# Patient Record
Sex: Male | Born: 1970
Health system: Southern US, Community
[De-identification: ages and names within clinical notes are randomized; demographics above are authoritative.]

## PROBLEM LIST (undated history)

## (undated) DIAGNOSIS — T7840XA Allergy, unspecified, initial encounter: Secondary | ICD-10-CM

## (undated) DIAGNOSIS — K219 Gastro-esophageal reflux disease without esophagitis: Secondary | ICD-10-CM

## (undated) DIAGNOSIS — E119 Type 2 diabetes mellitus without complications: Secondary | ICD-10-CM

## (undated) HISTORY — DX: Allergy, unspecified, initial encounter: T78.40XA

## (undated) HISTORY — PX: WRIST MASS EXCISION: SHX2674

## (undated) HISTORY — DX: Gastro-esophageal reflux disease without esophagitis: K21.9

## (undated) HISTORY — PX: COLONOSCOPY: SHX174

## (undated) HISTORY — PX: TONSILLECTOMY: SUR1361

---

## 2003-12-10 ENCOUNTER — Emergency Department: Payer: Self-pay | Admitting: Emergency Medicine

## 2003-12-10 ENCOUNTER — Other Ambulatory Visit: Payer: Self-pay

## 2006-07-13 ENCOUNTER — Ambulatory Visit: Payer: Self-pay | Admitting: Gastroenterology

## 2006-09-08 ENCOUNTER — Ambulatory Visit: Payer: Self-pay | Admitting: Gastroenterology

## 2006-09-29 ENCOUNTER — Ambulatory Visit: Payer: Self-pay | Admitting: Family Medicine

## 2006-10-04 ENCOUNTER — Ambulatory Visit: Payer: Self-pay | Admitting: Gastroenterology

## 2006-12-15 ENCOUNTER — Ambulatory Visit: Payer: Self-pay | Admitting: Family Medicine

## 2008-05-14 ENCOUNTER — Ambulatory Visit: Payer: Self-pay | Admitting: Internal Medicine

## 2009-01-02 ENCOUNTER — Ambulatory Visit: Payer: Self-pay | Admitting: Internal Medicine

## 2009-01-12 ENCOUNTER — Ambulatory Visit: Payer: Self-pay | Admitting: Urology

## 2009-02-20 ENCOUNTER — Ambulatory Visit: Payer: Self-pay | Admitting: Gastroenterology

## 2009-09-29 ENCOUNTER — Ambulatory Visit: Payer: Self-pay | Admitting: Gastroenterology

## 2011-12-02 IMAGING — CT CT STONE STUDY
1 of 2 series · 15 of 32 positions shown, 19 images · non-contrast
Comparison: none

REASON FOR EXAM: left flank pain
COMMENTS:

[Series 3: soft tissue · axial · 0.78mm/px · z∈[-685,-190]mm · 15 of 181 slices shown, 19 images]
[im 8/181  soft-tissue]
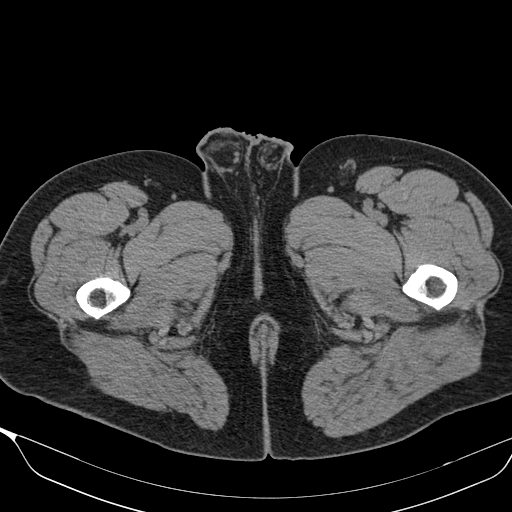
[im 8/181  bone]
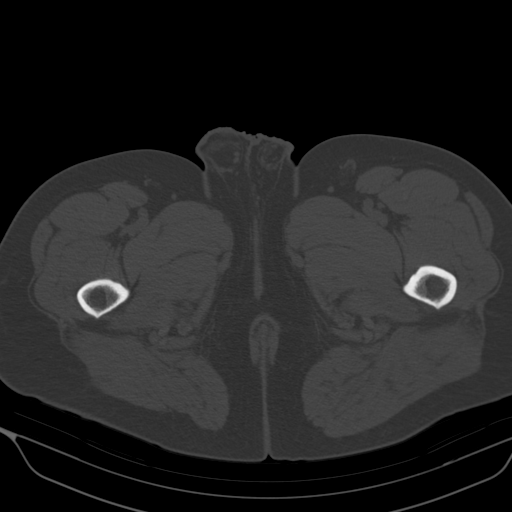
[im 23/181  soft-tissue]
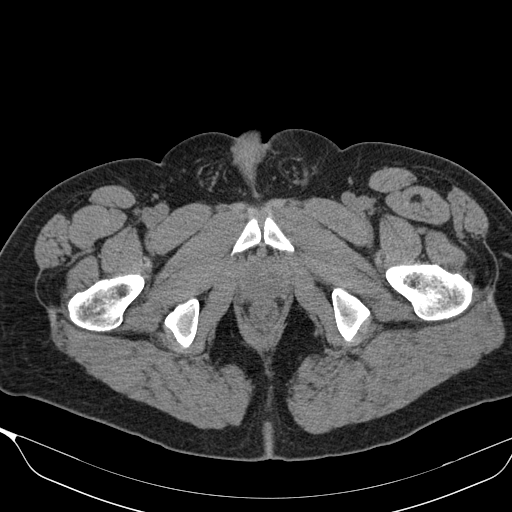
[im 38/181  soft-tissue]
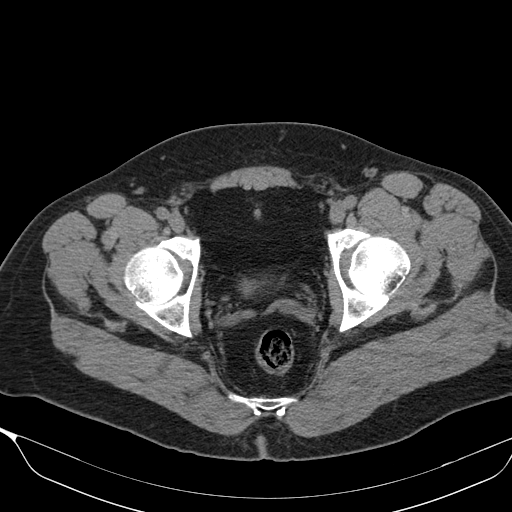
[im 53/181  soft-tissue]
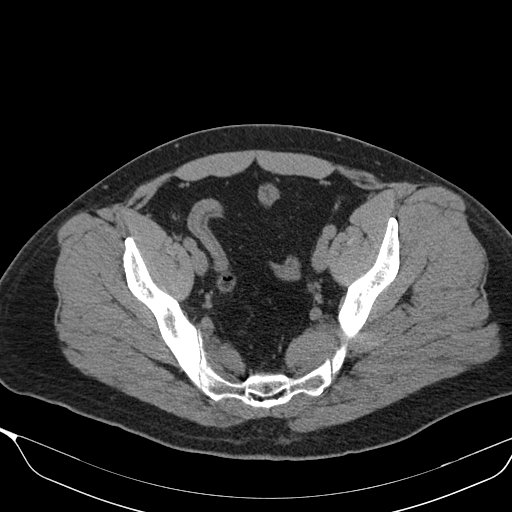
[im 61/181  soft-tissue]
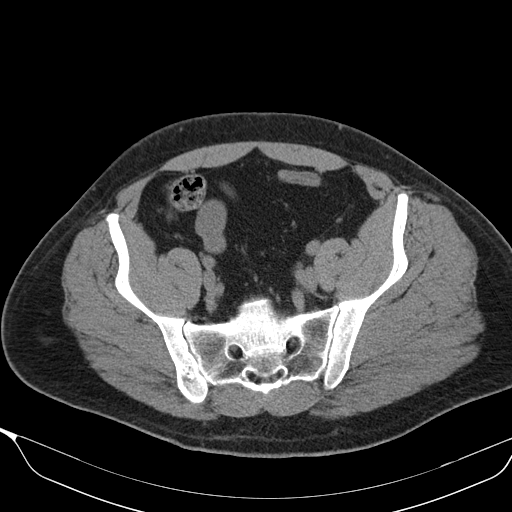
[im 76/181  soft-tissue]
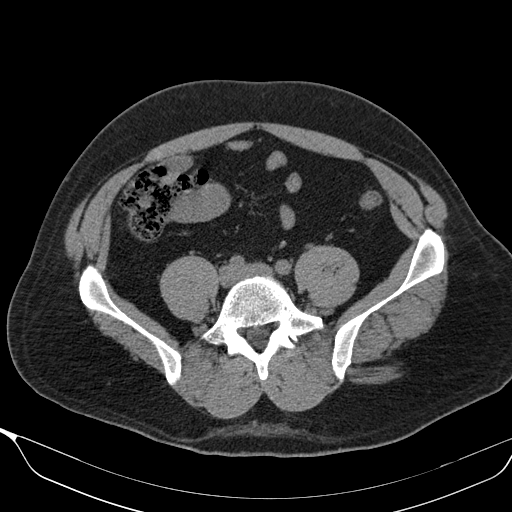
[im 91/181  soft-tissue]
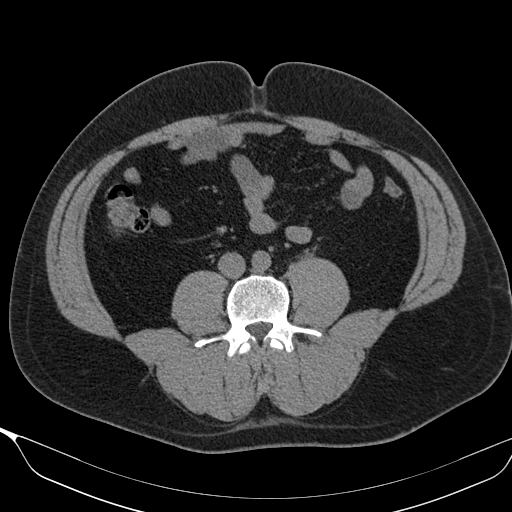
[im 106/181  soft-tissue]
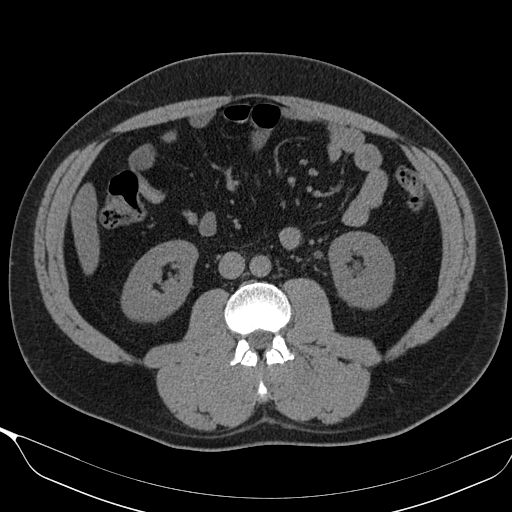
[im 121/181  soft-tissue]
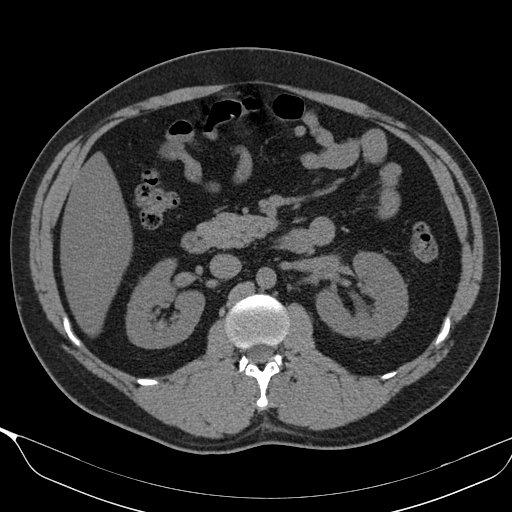
[im 121/181  bone]
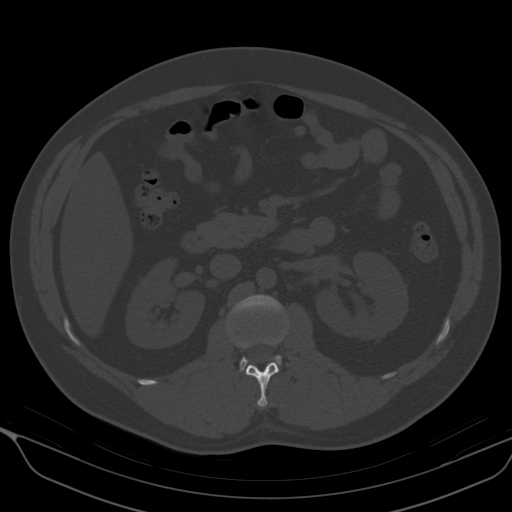
[im 128/181  soft-tissue]
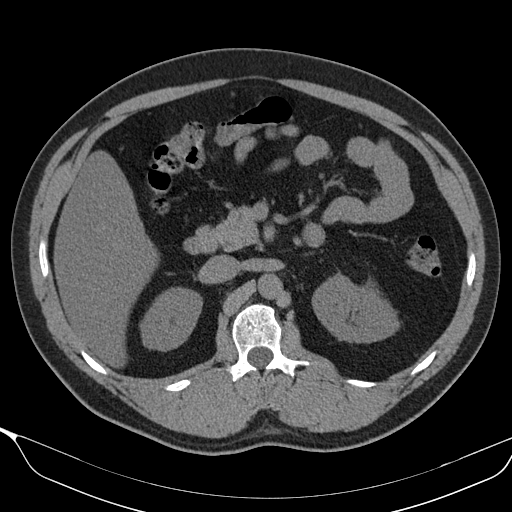
[im 143/181  soft-tissue]
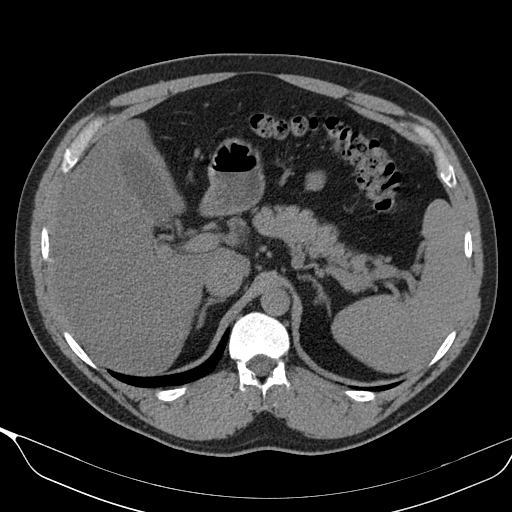
[im 151/181  lung]
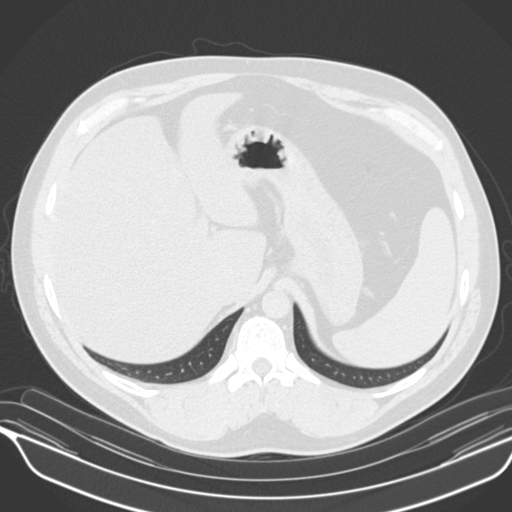
[im 158/181  soft-tissue]
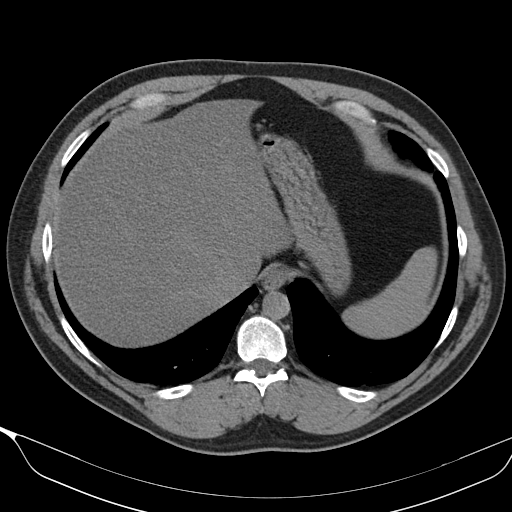
[im 158/181  lung]
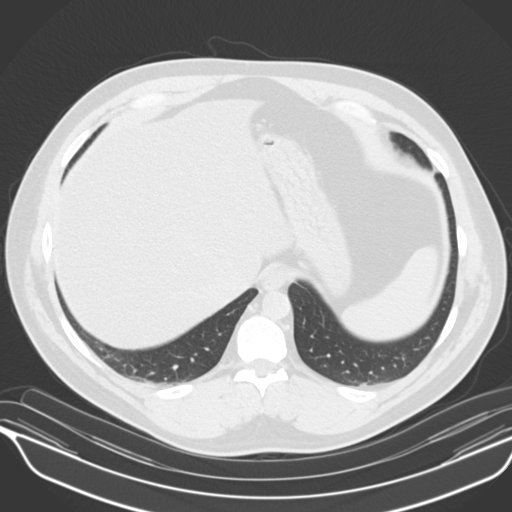
[im 166/181  lung]
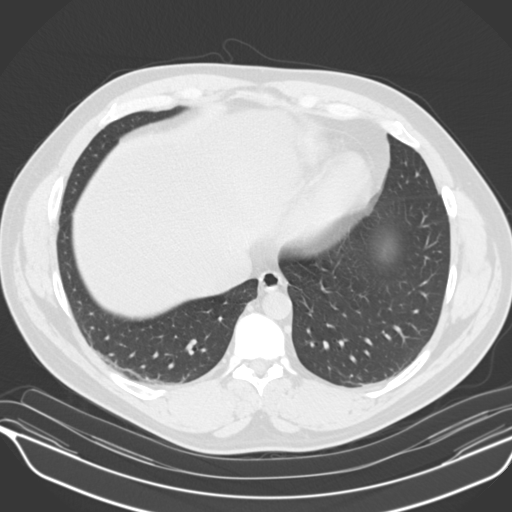
[im 173/181  soft-tissue]
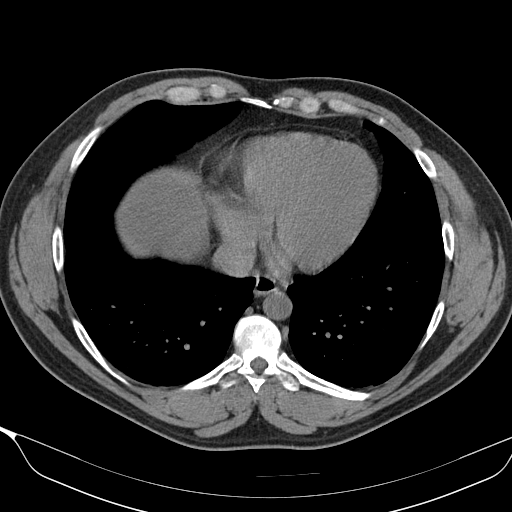
[im 173/181  lung]
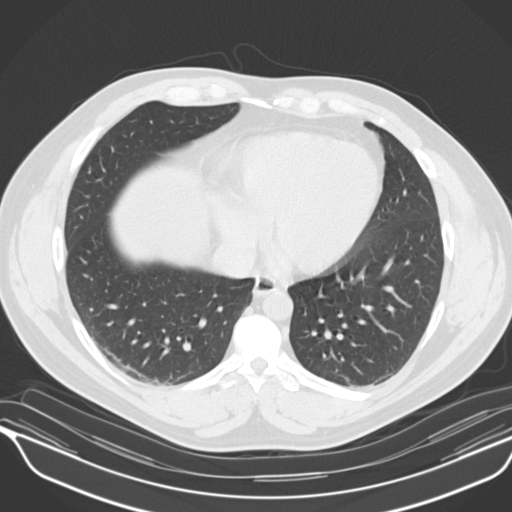

[15 of 32 positions shown; findings below may reference images not displayed]

PROCEDURE:     PAGOL MON - PAGOL MON ABDOMEN/PELVIS WO ( STONE)  - January 02, 2009  [DATE]

RESULT:     Axial noncontrast CT scanning was performed through the abdomen
and pelvis at 3 mm intervals and slice thicknesses.

There is mild hydronephrosis and hydroureter on the left secondary to an
approximately 3 mm diameter stone in the distal left ureter. This is seen on
image 133. There is a tiny lower pole nonobstructing stone on the left.
There are two 1mm or less diameter stones in mid and lower pole calyces on
the right not producing obstruction. The urinary bladder is only partially
distended and is grossly normal.

The liver exhibits decreased density diffusely which likely reflects fatty
infiltration. The gallbladder, spleen, pancreas, stomach, adrenal glands,
and periaortic and pericaval regions are normal in appearance. No acute
bowel abnormality is seen. The lung bases are clear.
IMPRESSION: 1. There is mild hydronephrosis and hydroureter on the left secondary to a 3
mm diameter stone in the distal left ureter. There is a nonobstructing 1 mm
diameter stone in a lower pole calyx on the left.
2. I do not see evidence of obstruction of the right kidney. There is a 1 mm
diameter nonobstructing stone in the right lower pole and another in a right
midpole calyx.
3. I see no acute abnormality elsewhere within the abdomen or pelvis.

This report was discussed by me by telephone with Dr. Peura at [HOSPITAL]
[HOSPITAL][DATE] p.m. on 02 January, 2009.

## 2014-11-10 ENCOUNTER — Emergency Department
Admission: EM | Admit: 2014-11-10 | Discharge: 2014-11-10 | Disposition: A | Discharge status: Home / Self Care | Payer: BLUE CROSS/BLUE SHIELD | Attending: Emergency Medicine | Admitting: Emergency Medicine

## 2014-11-10 ENCOUNTER — Encounter: Payer: Self-pay | Admitting: Emergency Medicine

## 2014-11-10 ENCOUNTER — Emergency Department: Payer: BLUE CROSS/BLUE SHIELD

## 2014-11-10 DIAGNOSIS — Z79899 Other long term (current) drug therapy: Secondary | ICD-10-CM | POA: Insufficient documentation

## 2014-11-10 DIAGNOSIS — R0602 Shortness of breath: Secondary | ICD-10-CM | POA: Insufficient documentation

## 2014-11-10 DIAGNOSIS — E119 Type 2 diabetes mellitus without complications: Secondary | ICD-10-CM | POA: Diagnosis not present

## 2014-11-10 DIAGNOSIS — K2970 Gastritis, unspecified, without bleeding: Secondary | ICD-10-CM

## 2014-11-10 DIAGNOSIS — R1010 Upper abdominal pain, unspecified: Secondary | ICD-10-CM | POA: Diagnosis present

## 2014-11-10 DIAGNOSIS — R109 Unspecified abdominal pain: Secondary | ICD-10-CM

## 2014-11-10 HISTORY — DX: Type 2 diabetes mellitus without complications: E11.9

## 2014-11-10 LAB — URINALYSIS COMPLETE WITH MICROSCOPIC (ARMC ONLY)
Bacteria, UA: NONE SEEN
Bilirubin Urine: NEGATIVE
Glucose, UA: NEGATIVE mg/dL
Hgb urine dipstick: NEGATIVE
Ketones, ur: NEGATIVE mg/dL
Leukocytes, UA: NEGATIVE
Nitrite: NEGATIVE
Protein, ur: NEGATIVE mg/dL
Specific Gravity, Urine: 1.023 (ref 1.005–1.030)
pH: 5 (ref 5.0–8.0)

## 2014-11-10 LAB — COMPREHENSIVE METABOLIC PANEL
ALT: 22 U/L (ref 17–63)
AST: 21 U/L (ref 15–41)
Albumin: 4.7 g/dL (ref 3.5–5.0)
Alkaline Phosphatase: 63 U/L (ref 38–126)
Anion gap: 6 (ref 5–15)
BUN: 14 mg/dL (ref 6–20)
CO2: 26 mmol/L (ref 22–32)
Calcium: 9.2 mg/dL (ref 8.9–10.3)
Chloride: 107 mmol/L (ref 101–111)
Creatinine, Ser: 0.98 mg/dL (ref 0.61–1.24)
GFR calc Af Amer: >60 mL/min (ref >60)
GFR calc non Af Amer: >60 mL/min (ref >60)
Glucose, Bld: 131 mg/dL — ABNORMAL HIGH (ref 65–99)
Potassium: 4 mmol/L (ref 3.5–5.1)
Sodium: 139 mmol/L (ref 135–145)
Total Bilirubin: 1.1 mg/dL (ref 0.3–1.2)
Total Protein: 7.8 g/dL (ref 6.5–8.1)

## 2014-11-10 LAB — CBC
HCT: 39.9 % — ABNORMAL LOW (ref 40.0–52.0)
Hemoglobin: 13.9 g/dL (ref 13.0–18.0)
MCH: 30.6 pg (ref 26.0–34.0)
MCHC: 34.8 g/dL (ref 32.0–36.0)
MCV: 87.9 fL (ref 80.0–100.0)
Platelets: 164 10*3/uL (ref 150–440)
RBC: 4.54 MIL/uL (ref 4.40–5.90)
RDW: 12.6 % (ref 11.5–14.5)
WBC: 10.6 10*3/uL (ref 3.8–10.6)

## 2014-11-10 LAB — GLUCOSE, CAPILLARY: Glucose-Capillary: 126 mg/dL — ABNORMAL HIGH (ref 65–99)

## 2014-11-10 LAB — LIPASE, BLOOD: Lipase: 27 U/L (ref 22–51)

## 2014-11-10 LAB — TROPONIN I: Troponin I: <0.03 ng/mL

## 2014-11-10 MED ORDER — FAMOTIDINE 40 MG PO TABS: 40.0000 mg | ORAL_TABLET | Freq: Every evening | ORAL | Status: DC

## 2014-11-10 MED ORDER — GI COCKTAIL ~~LOC~~
30.0000 mL | Freq: Once | ORAL | Status: AC
  Administered 2014-11-10: 30 mL via ORAL
  Filled 2014-11-10: qty 30

## 2014-11-10 MED ORDER — SUCRALFATE 1 G PO TABS
1.0000 g | ORAL_TABLET | Freq: Once | ORAL | Status: AC
  Administered 2014-11-10: 1 g via ORAL
  Filled 2014-11-10: qty 1

## 2014-11-10 MED ORDER — FAMOTIDINE 40 MG PO TABS
40.0000 mg | ORAL_TABLET | Freq: Every evening | ORAL | Status: DC
Start: 2014-11-10 — End: 2014-11-10

## 2014-11-10 MED ORDER — IOHEXOL 300 MG/ML  SOLN
100.0000 mL | Freq: Once | INTRAMUSCULAR | Status: AC | PRN
  Administered 2014-11-10: 100 mL via INTRAVENOUS

## 2014-11-10 MED ORDER — SUCRALFATE 1 G PO TABS: 1.0000 g | ORAL_TABLET | Freq: Two times a day (BID) | ORAL | Status: DC

## 2014-11-10 MED ORDER — IOHEXOL 240 MG/ML SOLN
25.0000 mL | Freq: Once | INTRAMUSCULAR | Status: AC | PRN
  Administered 2014-11-10: 25 mL via ORAL

## 2014-11-10 MED ORDER — FAMOTIDINE 20 MG PO TABS
40.0000 mg | ORAL_TABLET | Freq: Once | ORAL | Status: AC
  Administered 2014-11-10: 40 mg via ORAL
  Filled 2014-11-10: qty 2

## 2014-11-10 MED ORDER — ONDANSETRON HCL 4 MG/2ML IJ SOLN
4.0000 mg | Freq: Once | INTRAMUSCULAR | Status: AC | PRN
  Administered 2014-11-10: 4 mg via INTRAVENOUS
  Filled 2014-11-10: qty 2

## 2014-11-10 MED ORDER — ONDANSETRON HCL 4 MG/2ML IJ SOLN
4.0000 mg | Freq: Once | INTRAMUSCULAR | Status: AC
  Administered 2014-11-10: 4 mg via INTRAVENOUS
  Filled 2014-11-10: qty 2

## 2014-11-10 MED ORDER — SUCRALFATE 1 G PO TABS: 1.0000 g | ORAL_TABLET | Freq: Four times a day (QID) | ORAL | Status: DC

## 2014-11-10 MED ORDER — MORPHINE SULFATE (PF) 4 MG/ML IV SOLN
4.0000 mg | Freq: Once | INTRAVENOUS | Status: AC
  Administered 2014-11-10: 4 mg via INTRAVENOUS
  Filled 2014-11-10: qty 1

## 2014-11-10 NOTE — Discharge Instructions (Signed)
Gastritis, Adult °Gastritis is soreness and swelling (inflammation) of the lining of the stomach. Gastritis can develop as a sudden onset (acute) or long-term (chronic) condition. If gastritis is not treated, it can lead to stomach bleeding and ulcers. °CAUSES  °Gastritis occurs when the stomach lining is weak or damaged. Digestive juices from the stomach then inflame the weakened stomach lining. The stomach lining may be weak or damaged due to viral or bacterial infections. One common bacterial infection is the Helicobacter pylori infection. Gastritis can also result from excessive alcohol consumption, taking certain medicines, or having too much acid in the stomach.  °SYMPTOMS  °In some cases, there are no symptoms. When symptoms are present, they may include: °· Pain or a burning sensation in the upper abdomen. °· Nausea. °· Vomiting. °· An uncomfortable feeling of fullness after eating. °DIAGNOSIS  °Your caregiver may suspect you have gastritis based on your symptoms and a physical exam. To determine the cause of your gastritis, your caregiver may perform the following: °· Blood or stool tests to check for the H pylori bacterium. °· Gastroscopy. A thin, flexible tube (endoscope) is passed down the esophagus and into the stomach. The endoscope has a light and camera on the end. Your caregiver uses the endoscope to view the inside of the stomach. °· Taking a tissue sample (biopsy) from the stomach to examine under a microscope. °TREATMENT  °Depending on the cause of your gastritis, medicines may be prescribed. If you have a bacterial infection, such as an H pylori infection, antibiotics may be given. If your gastritis is caused by too much acid in the stomach, H2 blockers or antacids may be given. Your caregiver may recommend that you stop taking aspirin, ibuprofen, or other nonsteroidal anti-inflammatory drugs (NSAIDs). °HOME CARE INSTRUCTIONS °· Only take over-the-counter or prescription medicines as directed by  your caregiver. °· If you were given antibiotic medicines, take them as directed. Finish them even if you start to feel better. °· Drink enough fluids to keep your urine clear or pale yellow. °· Avoid foods and drinks that make your symptoms worse, such as: °· Caffeine or alcoholic drinks. °· Chocolate. °· Peppermint or mint flavorings. °· Garlic and onions. °· Spicy foods. °· Citrus fruits, such as oranges, lemons, or limes. °· Tomato-based foods such as sauce, chili, salsa, and pizza. °· Fried and fatty foods. °· Eat small, frequent meals instead of large meals. °SEEK IMMEDIATE MEDICAL CARE IF:  °· You have black or dark red stools. °· You vomit blood or material that looks like coffee grounds. °· You are unable to keep fluids down. °· Your abdominal pain gets worse. °· You have a fever. °· You do not feel better after 1 week. °· You have any other questions or concerns. °MAKE SURE YOU: °· Understand these instructions. °· Will watch your condition. °· Will get help right away if you are not doing well or get worse. °  °This information is not intended to replace advice given to you by your health care provider. Make sure you discuss any questions you have with your health care provider. °  °Document Released: 01/04/2001 Document Revised: 07/12/2011 Document Reviewed: 02/23/2011 °Elsevier Interactive Patient Education ©2016 [employer] ° °Abdominal Pain, Adult °Many things can cause abdominal pain. Usually, abdominal pain is not caused by a disease and will improve without treatment. It can often be observed and treated at home. Your health care provider will do a physical exam and possibly order blood tests and X-rays to   help determine the seriousness of your pain. However, in many cases, more time must pass before a clear cause of the pain can be found. Before that point, your health care provider may not know if you need more testing or further treatment. °HOME CARE INSTRUCTIONS °Monitor your abdominal pain  for any changes. The following actions may help to alleviate any discomfort you are experiencing: °· Only take over-the-counter or prescription medicines as directed by your health care provider. °· Do not take laxatives unless directed to do so by your health care provider. °· Try a clear liquid diet (broth, tea, or water) as directed by your health care provider. Slowly move to a bland diet as tolerated. °SEEK MEDICAL CARE IF: °· You have unexplained abdominal pain. °· You have abdominal pain associated with nausea or diarrhea. °· You have pain when you urinate or have a bowel movement. °· You experience abdominal pain that wakes you in the night. °· You have abdominal pain that is worsened or improved by eating food. °· You have abdominal pain that is worsened with eating fatty foods. °· You have a fever. °SEEK IMMEDIATE MEDICAL CARE IF: °· Your pain does not go away within 2 hours. °· You keep throwing up (vomiting). °· Your pain is felt only in portions of the abdomen, such as the right side or the left lower portion of the abdomen. °· You pass bloody or black tarry stools. °MAKE SURE YOU: °· Understand these instructions. °· Will watch your condition. °· Will get help right away if you are not doing well or get worse. °  °This information is not intended to replace advice given to you by your health care provider. Make sure you discuss any questions you have with your health care provider. °  °Document Released: 10/20/2004 Document Revised: 10/01/2014 Document Reviewed: 09/19/2012 °Elsevier Interactive Patient Education ©2016 [employer] ° °

## 2014-11-10 NOTE — ED Notes (Signed)
Pt c/o epigastric pain since noon yesterday; progressively worsening but has eaten his normal amount; some nausea; pain is nonradiating; denies diaphoresis

## 2014-11-10 NOTE — ED Provider Notes (Signed)
[facility] Emergency Department Provider Note  ____________________________________________  Time seen: Approximately 902 AM  I have reviewed the triage vital signs and the nursing notes.   HISTORY  Chief Complaint Abdominal Pain    HPI Isaiah Doyle. is a 44 y.o. male who comes in with epigastric pain. The patient reports that the pain started yesterday around noon. He reports that he was at home when the pain started. The patient left to go to a soccer game and the pain continued to become worse. He reports he ate some chicken wraps as well as drink some soda and beer. The patient felt as though he had to burp but was unable to do so. The patient also took some Tums but nothing helped. The patient reports that he has been getting worse and he is unable to tolerate it anymore. The patient reports that the pain currently as a 6 out of 10 in intensity. He reports it is sharp pain and he feels nauseous. He reports that the nausea comes and goes. The patient has not had anything like this before, reports that he sometimes gets shortness of breath but nothing related to this.   Past medical history Diabetes  There are no active problems to display for this patient.   Past surgical history Tonsillectomy Ganglion cyst removal  Current Outpatient Rx  Name  Route  Sig  Dispense  Refill  . Multiple Vitamin (MULTIVITAMIN) tablet   Oral   Take 1 tablet by mouth daily.         . famotidine (PEPCID) 40 MG tablet   Oral   Take 1 tablet (40 mg total) by mouth every evening.   15 tablet   0   . sucralfate (CARAFATE) 1 G tablet   Oral   Take 1 tablet (1 g total) by mouth 2 (two) times daily.   30 tablet   0     Allergies Erythromycin  No family history on file.  Social History Social History  Substance Use Topics  . Smoking status: None  . Smokeless tobacco: None  . Alcohol Use: None    Review of Systems Constitutional: No  fever/chills Eyes: No visual changes. ENT: No sore throat. Cardiovascular: Denies chest pain. Respiratory: Intermittent shortness of breath. Gastrointestinal:  abdominal pain and nausea, no vomiting.  No diarrhea.  No constipation. Genitourinary: Negative for dysuria. Musculoskeletal: Negative for back pain. Skin: Negative for rash. Neurological: Negative for headaches, focal weakness or numbness.  10-point ROS otherwise negative.  ____________________________________________   PHYSICAL EXAM:  VITAL SIGNS: ED Triage Vitals  Enc Vitals Group     BP 11/10/14 0407 147/85 mmHg     Pulse Rate 11/10/14 0407 95     Resp 11/10/14 0407 18     Temp 11/10/14 0407 97.5 F (36.4 C)     Temp Source 11/10/14 0407 Oral     SpO2 11/10/14 0407 97 %     Weight 11/10/14 0407 215 lb (97.523 kg)     Height 11/10/14 0407 6\' 4"  (1.93 m)     Head Cir --      Peak Flow --      Pain Score 11/10/14 0409 6     Pain Loc --      Pain Edu? --      Excl. in [place]? --     Constitutional: Alert and oriented. Well appearing and in moderate distress. Eyes: Conjunctivae are normal. PERRL. EOMI. Head: Atraumatic. Nose: No congestion/rhinnorhea. Mouth/Throat: Mucous  membranes are moist.  Oropharynx non-erythematous. Cardiovascular: Normal rate, regular rhythm. Grossly normal heart sounds.  Good peripheral circulation. Respiratory: Normal respiratory effort.  No retractions. Lungs CTAB. Gastrointestinal: Soft with minimal upper abdominal tenderness to palpation. No distention. Positive bowel sounds Musculoskeletal: No lower extremity tenderness nor edema.   Neurologic:  Normal speech and language.  Skin:  Skin is warm, dry and intact.  Psychiatric: Mood and affect are normal.   ____________________________________________   LABS (all labs ordered are listed, but only abnormal results are displayed)  Labs Reviewed  COMPREHENSIVE METABOLIC PANEL - Abnormal; Notable for the following:    Glucose, Bld 131  (*)    All other components within normal limits  CBC - Abnormal; Notable for the following:    HCT 39.9 (*)    All other components within normal limits  URINALYSIS COMPLETEWITH MICROSCOPIC ([facility] ONLY) - Abnormal; Notable for the following:    Color, Urine YELLOW (*)    APPearance CLEAR (*)    Squamous Epithelial / LPF 0-5 (*)    All other components within normal limits  GLUCOSE, CAPILLARY - Abnormal; Notable for the following:    Glucose-Capillary 126 (*)    All other components within normal limits  LIPASE, BLOOD  TROPONIN I   ____________________________________________  EKG  ED ECG REPORT I, Loney Hering, the attending physician, personally viewed and interpreted this ECG.   Date: 11/10/2014  EKG Time: 413  Rate: 80  Rhythm: normal sinus rhythm  Axis: Normal  Intervals:none  ST&T Change: None  ____________________________________________  RADIOLOGY  CT abdomen and pelvis: No acute findings, nonobstructive left nephrolithiasis, decreased hepatic steatosis since prior exam. ____________________________________________   PROCEDURES  Procedure(s) performed: None  Critical Care performed: No  ____________________________________________   INITIAL IMPRESSION / ASSESSMENT AND PLAN / ED COURSE  Pertinent labs & imaging results that were available during my care of the patient were reviewed by me and considered in my medical decision making (see chart for details).  This is a 44 year old male who comes in today with upper abdominal pain. The pain has been going on for over 18 hours. The patient reports it is been getting worse and is unable to tolerate it. The patient did receive a GI cocktail initially but did write for dose of morphine and Zofran. I will send the patient for a CT scan to determine the possible cause of his symptoms. The patient's CT scan is unremarkable. The patient did receive a dose of morphine and Zofran. He also received Carafate and  Pepcid. At this time I do not have a cause for the patient's pain. The patient's workup is unremarkable. He did receive one troponin but he reports his pain has been going on since yesterday at noon. I will discharge the patient home and have him follow-up with a GI physician for further evaluation. I discussed this with the patient and discuss foods that he should avoid an effort to keep the pain from recurring. The patient will be discharged to home. ____________________________________________   FINAL CLINICAL IMPRESSION(S) / ED DIAGNOSES  Final diagnoses:  Abdominal pain, acute  Gastritis      Loney Hering, MD 11/10/14 [phone]

## 2014-11-10 NOTE — ED Notes (Signed)
Patient being discharged home today. He has his discharge instructions and prescriptions. Family is driving patient home. There are no questions or concerns at this time. Family asks to walk out to car.

## 2014-11-12 ENCOUNTER — Encounter: Payer: Self-pay | Admitting: *Deleted

## 2014-11-13 ENCOUNTER — Encounter
Admission: RE | Disposition: A | Discharge status: Home / Self Care | Payer: Self-pay | Source: Ambulatory Visit | Attending: Gastroenterology

## 2014-11-13 ENCOUNTER — Ambulatory Visit
Admission: RE | Admit: 2014-11-13 | Discharge: 2014-11-13 | Disposition: A | Discharge status: Home / Self Care | Payer: BLUE CROSS/BLUE SHIELD | Source: Ambulatory Visit | Attending: Gastroenterology | Admitting: Gastroenterology

## 2014-11-13 ENCOUNTER — Ambulatory Visit: Payer: BLUE CROSS/BLUE SHIELD | Admitting: Anesthesiology

## 2014-11-13 ENCOUNTER — Encounter: Payer: Self-pay | Admitting: *Deleted

## 2014-11-13 DIAGNOSIS — K2950 Unspecified chronic gastritis without bleeding: Secondary | ICD-10-CM | POA: Insufficient documentation

## 2014-11-13 DIAGNOSIS — E1122 Type 2 diabetes mellitus with diabetic chronic kidney disease: Secondary | ICD-10-CM | POA: Insufficient documentation

## 2014-11-13 DIAGNOSIS — R1013 Epigastric pain: Secondary | ICD-10-CM | POA: Diagnosis present

## 2014-11-13 DIAGNOSIS — N189 Chronic kidney disease, unspecified: Secondary | ICD-10-CM | POA: Insufficient documentation

## 2014-11-13 DIAGNOSIS — Z881 Allergy status to other antibiotic agents status: Secondary | ICD-10-CM | POA: Diagnosis not present

## 2014-11-13 DIAGNOSIS — K210 Gastro-esophageal reflux disease with esophagitis: Secondary | ICD-10-CM | POA: Diagnosis not present

## 2014-11-13 DIAGNOSIS — Z87891 Personal history of nicotine dependence: Secondary | ICD-10-CM | POA: Diagnosis not present

## 2014-11-13 HISTORY — PX: ESOPHAGOGASTRODUODENOSCOPY (EGD) WITH PROPOFOL: SHX5813

## 2014-11-13 SURGERY — ESOPHAGOGASTRODUODENOSCOPY (EGD) WITH PROPOFOL
Anesthesia: General

## 2014-11-13 MED ORDER — SODIUM CHLORIDE 0.9 % IV SOLN: INTRAVENOUS | Status: DC

## 2014-11-13 MED ORDER — LIDOCAINE HCL (CARDIAC) 20 MG/ML IV SOLN
INTRAVENOUS | Status: DC | PRN
  Administered 2014-11-13: 100 mg via INTRAVENOUS

## 2014-11-13 MED ORDER — PROPOFOL 10 MG/ML IV BOLUS
INTRAVENOUS | Status: DC | PRN
  Administered 2014-11-13: 50 mg via INTRAVENOUS
  Administered 2014-11-13: 40 mg via INTRAVENOUS
  Administered 2014-11-13: 50 mg via INTRAVENOUS

## 2014-11-13 MED ORDER — SODIUM CHLORIDE 0.9 % IV SOLN
INTRAVENOUS | Status: DC
  Administered 2014-11-13: 1000 mL via INTRAVENOUS

## 2014-11-13 MED ORDER — PROPOFOL 500 MG/50ML IV EMUL
INTRAVENOUS | Status: DC | PRN
  Administered 2014-11-13: 140 ug/kg/min via INTRAVENOUS

## 2014-11-13 NOTE — Transfer of Care (Signed)
Immediate Anesthesia Transfer of Care Note  Patient: Isaiah Doyle.  Procedure(s) Performed: Procedure(s): ESOPHAGOGASTRODUODENOSCOPY (EGD) WITH PROPOFOL (N/A)  Patient Location: PACU  Anesthesia Type:General  Level of Consciousness: sedated  Airway & Oxygen Therapy: Patient Spontanous Breathing and Patient connected to nasal cannula oxygen  Post-op Assessment: Report given to RN and Post -op Vital signs reviewed and stable  Post vital signs: Reviewed and stable  Last Vitals:  Filed Vitals:   11/13/14 1519  BP: 128/71  Pulse: 70  Temp: 36.6 C  Resp:     Complications: No apparent anesthesia complications

## 2014-11-13 NOTE — Anesthesia Preprocedure Evaluation (Signed)
Anesthesia Evaluation  Patient identified by MRN, date of birth, ID band Patient awake    Reviewed: Allergy & Precautions, H&P , NPO status , Patient's Chart, lab work & pertinent test results  History of Anesthesia Complications Negative for: history of anesthetic complications  Airway Mallampati: II  TM Distance: >3 FB Neck ROM: full    Dental no notable dental hx. (+) Teeth Intact   Pulmonary neg shortness of breath, former smoker,    Pulmonary exam normal breath sounds clear to auscultation       Cardiovascular Exercise Tolerance: Good (-) angina(-) Past MI and (-) DOE negative cardio ROS Normal cardiovascular exam Rhythm:regular Rate:Normal     Neuro/Psych negative neurological ROS  negative psych ROS   GI/Hepatic negative GI ROS, Neg liver ROS,   Endo/Other  diabetes, Well Controlled  Renal/GU Renal disease  negative genitourinary   Musculoskeletal   Abdominal   Peds  Hematology negative hematology ROS (+)   Anesthesia Other Findings Past Medical History:   Diabetes mellitus without complication (HCC)                 Chronic kidney disease                                      Past Surgical History:   TONSILLECTOMY                                                 WRIST MASS EXCISION                                           COLONOSCOPY                                                     Reproductive/Obstetrics negative OB ROS                             Anesthesia Physical Anesthesia Plan  ASA: III  Anesthesia Plan: General   Post-op Pain Management:    Induction:   Airway Management Planned:   Additional Equipment:   Intra-op Plan:   Post-operative Plan:   Informed Consent: I have reviewed the patients History and Physical, chart, labs and discussed the procedure including the risks, benefits and alternatives for the proposed anesthesia with the patient or  authorized representative who has indicated his/her understanding and acceptance.   Dental Advisory Given  Plan Discussed with: Anesthesiologist, CRNA and Surgeon  Anesthesia Plan Comments: (Requested that patient remove contact lenses to prevent corneal abrasion  )        Anesthesia Quick Evaluation

## 2014-11-13 NOTE — Op Note (Signed)
[facility] Gastroenterology Patient Name: Isaiah Doyle Procedure Date: 11/13/2014 3:05 PM MRN: [id number] Account #: [id number] Date of Birth: [birth year] Admit Type: Outpatient Age: 44 Room: [facility] ENDO ROOM 4 Gender: Male Note Status: Finalized Procedure:         Upper GI endoscopy Indications:       Epigastric abdominal pain Providers:         Lupita Dawn. Candace Cruise, MD Referring MD:      Laurine Blazer (Referring MD) Medicines:         Monitored Anesthesia Care Complications:     No immediate complications. Procedure:         Pre-Anesthesia Assessment:                    - Prior to the procedure, a History and Physical was                     performed, and patient medications, allergies and                     sensitivities were reviewed. The patient's tolerance of                     previous anesthesia was reviewed.                    - The risks and benefits of the procedure and the sedation                     options and risks were discussed with the patient. All                     questions were answered and informed consent was obtained.                    After obtaining informed consent, the endoscope was passed                     under direct vision. Throughout the procedure, the                     patient's blood pressure, pulse, and oxygen saturations                     were monitored continuously. The Endoscope was introduced                     through the mouth, and advanced to the second part of                     duodenum. The upper GI endoscopy was accomplished without                     difficulty. The patient tolerated the procedure well. Findings:      LA Grade A (one or more mucosal breaks less than 5 mm, not extending       between tops of 2 mucosal folds) esophagitis was found at the       gastroesophageal junction. Biopsies were taken with a cold forceps for       histology.      The exam was otherwise without abnormality.      Localized mild inflammation was found at the incisura. Biopsies were       taken with a  cold forceps for histology.      The exam was otherwise without abnormality.      The examined duodenum was normal. Impression:        - LA Grade A reflux esophagitis. Biopsied.                    - The examination was otherwise normal.                    - Gastritis. Biopsied.                    - The examination was otherwise normal.                    - Normal examined duodenum. Recommendation:    - Discharge patient to home.                    - Observe patient's clinical course.                    - Await pathology results.                    - The findings and recommendations were discussed with the                     patient.                    - Take priloseec 20mg  daily instead. Procedure Code(s): --- Professional ---                    [phone], Esophagogastroduodenoscopy, flexible, transoral;                     with biopsy, single or multiple Diagnosis Code(s): --- Professional ---                    K21.0, Gastro-esophageal reflux disease with esophagitis                    K29.70, Gastritis, unspecified, without bleeding                    R10.13, Epigastric pain CPT copyright 2014 American Medical Association. All rights reserved. The codes documented in this report are preliminary and upon coder review may  be revised to meet current compliance requirements. Hulen Luster, MD 11/13/2014 3:18:12 PM This report has been signed electronically. Number of Addenda: 0 Note Initiated On: 11/13/2014 3:05 PM      [facility]

## 2014-11-13 NOTE — H&P (Signed)
  Date of Initial H&P:11/12/2014 History reviewed, patient examined, no change in status, stable for surgery.

## 2014-11-13 NOTE — Anesthesia Procedure Notes (Signed)
Performed by: Johnna Acosta Pre-anesthesia Checklist: Patient identified, Emergency Drugs available and Suction available Patient Re-evaluated:Patient Re-evaluated prior to inductionOxygen Delivery Method: Nasal cannula

## 2014-11-13 NOTE — Anesthesia Postprocedure Evaluation (Signed)
  Anesthesia Post-op Note  Patient: Isaiah Doyle.  Procedure(s) Performed: Procedure(s): ESOPHAGOGASTRODUODENOSCOPY (EGD) WITH PROPOFOL (N/A)  Anesthesia type:General  Patient location: PACU  Post pain: Pain level controlled  Post assessment: Post-op Vital signs reviewed, Patient's Cardiovascular Status Stable, Respiratory Function Stable, Patent Airway and No signs of Nausea or vomiting  Post vital signs: Reviewed and stable  Last Vitals:  Filed Vitals:   11/13/14 1540  BP: 143/92  Pulse: 68  Temp:   Resp: 21    Level of consciousness: awake, alert  and patient cooperative  Complications: No apparent anesthesia complications

## 2014-11-14 ENCOUNTER — Encounter: Payer: Self-pay | Admitting: Gastroenterology

## 2014-11-17 LAB — SURGICAL PATHOLOGY

## 2014-12-16 ENCOUNTER — Ambulatory Visit (INDEPENDENT_AMBULATORY_CARE_PROVIDER_SITE_OTHER): Payer: BLUE CROSS/BLUE SHIELD | Admitting: Family Medicine

## 2014-12-16 ENCOUNTER — Encounter: Payer: Self-pay | Admitting: Family Medicine

## 2014-12-16 VITALS — BP 130/77 | HR 67 | Temp 97.9°F | Resp 18 | Ht 76.0 in | Wt 213.0 lb

## 2014-12-16 DIAGNOSIS — K219 Gastro-esophageal reflux disease without esophagitis: Secondary | ICD-10-CM

## 2014-12-16 DIAGNOSIS — Z7189 Other specified counseling: Secondary | ICD-10-CM

## 2014-12-16 DIAGNOSIS — E785 Hyperlipidemia, unspecified: Secondary | ICD-10-CM

## 2014-12-16 DIAGNOSIS — Z7689 Persons encountering health services in other specified circumstances: Secondary | ICD-10-CM

## 2014-12-16 DIAGNOSIS — J302 Other seasonal allergic rhinitis: Secondary | ICD-10-CM | POA: Diagnosis not present

## 2014-12-16 DIAGNOSIS — E1169 Type 2 diabetes mellitus with other specified complication: Secondary | ICD-10-CM | POA: Insufficient documentation

## 2014-12-16 DIAGNOSIS — N200 Calculus of kidney: Secondary | ICD-10-CM | POA: Diagnosis not present

## 2014-12-16 DIAGNOSIS — E119 Type 2 diabetes mellitus without complications: Secondary | ICD-10-CM | POA: Diagnosis not present

## 2014-12-16 MED ORDER — OMEPRAZOLE 20 MG PO CPDR: 20.0000 mg | DELAYED_RELEASE_CAPSULE | Freq: Every day | ORAL | Status: DC

## 2014-12-16 NOTE — Progress Notes (Signed)
Name: Isaiah Doyle.   MRN: [id number]    DOB: Jul 01, 1970   Date:12/16/2014       Progress Note  Subjective  Chief Complaint  Chief Complaint  Patient presents with  . Establish Care    stomach issues had gastritis.    HPI Here to establish care.  Has hx of renal stones, esophagitis, and T2DM with max BS of >700  In 2011.  With insulin, then Metformin, then diet he is now off all meds.  Lost ~ 47 # during that year. No problem-specific assessment & plan notes found for this encounter.   Past Medical History  Diagnosis Date  . Diabetes mellitus without complication ([place])   . Allergy   . GERD (gastroesophageal reflux disease)   . Chronic kidney disease     kidney stones    Past Surgical History  Procedure Laterality Date  . Tonsillectomy    . Wrist mass excision    . Colonoscopy    . Esophagogastroduodenoscopy (egd) with propofol N/A 11/13/2014    Procedure: ESOPHAGOGASTRODUODENOSCOPY (EGD) WITH PROPOFOL;  Surgeon: Hulen Luster, MD;  Location: [facility] ENDOSCOPY;  Service: Gastroenterology;  Laterality: N/A;    Family History  Problem Relation Age of Onset  . Cancer Mother     BREAST  . Heart attack Father   . Heart disease Father     Social History   Social History  . Marital Status: Married    Spouse Name: N/A  . Number of Children: N/A  . Years of Education: N/A   Occupational History  . Not on file.   Social History Main Topics  . Smoking status: Former Research scientist (life sciences)  . Smokeless tobacco: Never Used  . Alcohol Use: 7.2 oz/week    12 Cans of beer per week  . Drug Use: No  . Sexual Activity: Not on file   Other Topics Concern  . Not on file   Social History Narrative     Current outpatient prescriptions:  Marland Kitchen  Multiple Vitamin (MULTIVITAMIN) tablet, Take 1 tablet by mouth daily., Disp: , Rfl:  .  omeprazole (PRILOSEC) 20 MG capsule, Take 20 mg by mouth daily., Disp: , Rfl: 2  Allergies  Allergen Reactions  . Erythromycin Nausea And Vomiting      Review of Systems  Constitutional: Negative for fever, chills, weight loss and malaise/fatigue.  HENT: Negative for congestion and hearing loss.   Eyes: Negative for blurred vision and double vision.  Respiratory: Negative for cough, shortness of breath and wheezing.   Cardiovascular: Negative for chest pain, palpitations and leg swelling.  Gastrointestinal: Negative for heartburn, nausea, vomiting, abdominal pain, diarrhea and blood in stool.  Genitourinary: Negative for dysuria, urgency and frequency.  Musculoskeletal: Negative for myalgias and joint pain.  Skin: Negative for itching and rash.  Neurological: Negative for dizziness, tremors, weakness and headaches.      Objective  Filed Vitals:   12/16/14 1400  BP: 130/77  Pulse: 67  Temp: 97.9 F (36.6 C)  TempSrc: Oral  Resp: 18  Height: 6' 4"  (1.93 m)  Weight: 213 lb (96.616 kg)    Physical Exam  Constitutional: He is oriented to person, place, and time and well-developed, well-nourished, and in no distress. No distress.  HENT:  Head: Normocephalic and atraumatic.  Eyes: Conjunctivae are normal. Pupils are equal, round, and reactive to light.  Neck: Normal range of motion. Carotid bruit is not present. No thyromegaly present.  Cardiovascular: Normal rate, regular rhythm, normal heart sounds and  intact distal pulses.  Exam reveals no gallop and no friction rub.   No murmur heard. Pulmonary/Chest: Effort normal and breath sounds normal. No respiratory distress. He has no wheezes. He has no rales.  Abdominal: Soft. Bowel sounds are normal. He exhibits no distension and no mass. There is no tenderness.  Musculoskeletal: He exhibits no edema.  Lymphadenopathy:    He has no cervical adenopathy.  Neurological: He is alert and oriented to person, place, and time.  Vitals reviewed.      Recent Results (from the past 2160 hour(s))  Urinalysis complete, with microscopic ([facility] only)     Status: Abnormal   Collection  Time: 11/10/14 12:14 AM  Result Value Ref Range   Color, Urine YELLOW (A) YELLOW   APPearance CLEAR (A) CLEAR   Glucose, UA NEGATIVE NEGATIVE mg/dL   Bilirubin Urine NEGATIVE NEGATIVE   Ketones, ur NEGATIVE NEGATIVE mg/dL   Specific Gravity, Urine 1.023 1.005 - 1.030   Hgb urine dipstick NEGATIVE NEGATIVE   pH 5.0 5.0 - 8.0   Protein, ur NEGATIVE NEGATIVE mg/dL   Nitrite NEGATIVE NEGATIVE   Leukocytes, UA NEGATIVE NEGATIVE   RBC / HPF 0-5 0 - 5 RBC/hpf   WBC, UA 0-5 0 - 5 WBC/hpf   Bacteria, UA NONE SEEN NONE SEEN   Squamous Epithelial / LPF 0-5 (A) NONE SEEN   Mucous PRESENT   Lipase, blood     Status: None   Collection Time: 11/10/14  4:48 AM  Result Value Ref Range   Lipase 27 22 - 51 U/L  Comprehensive metabolic panel     Status: Abnormal   Collection Time: 11/10/14  4:48 AM  Result Value Ref Range   Sodium 139 135 - 145 mmol/L   Potassium 4.0 3.5 - 5.1 mmol/L   Chloride 107 101 - 111 mmol/L   CO2 26 22 - 32 mmol/L   Glucose, Bld 131 (H) 65 - 99 mg/dL   BUN 14 6 - 20 mg/dL   Creatinine, Ser 0.98 0.61 - 1.24 mg/dL   Calcium 9.2 8.9 - 10.3 mg/dL   Total Protein 7.8 6.5 - 8.1 g/dL   Albumin 4.7 3.5 - 5.0 g/dL   AST 21 15 - 41 U/L   ALT 22 17 - 63 U/L   Alkaline Phosphatase 63 38 - 126 U/L   Total Bilirubin 1.1 0.3 - 1.2 mg/dL   GFR calc non Af Amer >60 >60 mL/min   GFR calc Af Amer >60 >60 mL/min    Comment: (NOTE) The eGFR has been calculated using the CKD EPI equation. This calculation has not been validated in all clinical situations. eGFR's persistently <60 mL/min signify possible Chronic Kidney Disease.    Anion gap 6 5 - 15  CBC     Status: Abnormal   Collection Time: 11/10/14  4:48 AM  Result Value Ref Range   WBC 10.6 3.8 - 10.6 K/uL   RBC 4.54 4.40 - 5.90 MIL/uL   Hemoglobin 13.9 13.0 - 18.0 g/dL   HCT 39.9 (L) 40.0 - 52.0 %   MCV 87.9 80.0 - 100.0 fL   MCH 30.6 26.0 - 34.0 pg   MCHC 34.8 32.0 - 36.0 g/dL   RDW 12.6 11.5 - 14.5 %   Platelets 164  150 - 440 K/uL  Troponin I     Status: None   Collection Time: 11/10/14  4:48 AM  Result Value Ref Range   Troponin I <0.03 <0.031 ng/mL    Comment:  NO INDICATION OF MYOCARDIAL INJURY.   Glucose, capillary     Status: Abnormal   Collection Time: 11/10/14  4:53 AM  Result Value Ref Range   Glucose-Capillary 126 (H) 65 - 99 mg/dL   Comment 1 Notify RN   Surgical pathology     Status: None   Collection Time: 11/13/14  3:13 PM  Result Value Ref Range   SURGICAL PATHOLOGY      Surgical Pathology CASE: [id number] PATIENT: Marlowe Sax Surgical Pathology Report     SPECIMEN SUBMITTED: A. Stomach, cbx B. Esophagus, cbx  CLINICAL HISTORY: None provided  PRE-OPERATIVE DIAGNOSIS: ABD pain  POST-OPERATIVE DIAGNOSIS: None provided     DIAGNOSIS: A. STOMACH; COLD BIOPSY: - OXYNTIC MUCOSA WITH MILD CHRONIC GASTRITIS, NONSPECIFIC. - NEGATIVE FOR H. PYLORI, DYSPLASIA, AND MALIGNANCY.  B. ESOPHAGUS; COLD BIOPSY: - GASTRIC CARDIAC TYPE MUCOSA WITH CHANGES SUGGESTIVE OF REFLUX. - FRAGMENTS OF UNREMARKABLE SQUAMOUS MUCOSA. - NEGATIVE FOR DYSPLASIA AND MALIGNANCY.   GROSS DESCRIPTION: A. Labeled: Cold biopsies stomach  Tissue fragment(s): 2  Size: 0.2-0.8 cm  Description: Tan tissue fragments  Entirely submitted in one cassette(s).   B. Labeled: Cold biopsy esophagus  Tissue fragment(s): 2  Size: 0.3-0.7 cm  Description: Pale white tissue fragments  Entirely submitted in one cassette(s).     Final Diagnosis perf ormed by Quay Burow, MD.  Electronically signed 11/17/2014 12:01:00PM    The electronic signature indicates that the named Attending Pathologist has evaluated the specimen  Technical component performed at [facility], [address] [place], [place] [address] Lab: [phone] Dir: Darrick Penna. Evette Doffing, MD  Professional component performed at [facility], [facility], [place], [place], [place] [address] Lab:  [phone] Dir: Dellia Nims. Rubinas, MD       Assessment & Plan  Problem List Items Addressed This Visit      Respiratory   Seasonal allergies     Digestive   GERD (gastroesophageal reflux disease) - Primary   Relevant Medications   omeprazole (PRILOSEC) 20 MG capsule   Other Relevant Orders   CBC with Differential     Endocrine   T2DM (type 2 diabetes mellitus) ([place])   Relevant Orders   HgB A1c     Genitourinary   Kidney stones     Other   Elevated lipids   Relevant Orders   Comprehensive Metabolic Panel (CMET)   Lipid Profile    Other Visit Diagnoses    Encounter to establish care           Meds ordered this encounter  Medications  . omeprazole (PRILOSEC) 20 MG capsule    Sig: Take 20 mg by mouth daily.    Refill:  2   1. Encounter to establish care   2. Gastroesophageal reflux disease, esophagitis presence not specified -continue Omeprazole daily. - CBC with Differential  3. Seasonal allergies   4. Elevated lipids  - Comprehensive Metabolic Panel (CMET) - Lipid Profile  5. Kidney stones   6. Type 2 diabetes mellitus without complication, without long-term current use of insulin ([facility])  - HgB A1c

## 2014-12-24 LAB — LIPID PANEL
Chol/HDL Ratio: 4.3 ratio units (ref 0.0–5.0)
Cholesterol, Total: 217 mg/dL — ABNORMAL HIGH (ref 100–199)
HDL: 51 mg/dL (ref >39)
LDL Calculated: 138 mg/dL — ABNORMAL HIGH (ref 0–99)
Triglycerides: 139 mg/dL (ref 0–149)
VLDL Cholesterol Cal: 28 mg/dL (ref 5–40)

## 2014-12-24 LAB — COMPREHENSIVE METABOLIC PANEL
ALT: 30 IU/L (ref 0–44)
AST: 22 IU/L (ref 0–40)
Albumin/Globulin Ratio: 1.8 (ref 1.1–2.5)
Albumin: 4.8 g/dL (ref 3.5–5.5)
Alkaline Phosphatase: 69 IU/L (ref 39–117)
BUN/Creatinine Ratio: 20 (ref 9–20)
BUN: 19 mg/dL (ref 6–24)
Bilirubin Total: 0.6 mg/dL (ref 0.0–1.2)
CO2: 25 mmol/L (ref 18–29)
Calcium: 9.8 mg/dL (ref 8.7–10.2)
Chloride: 101 mmol/L (ref 97–106)
Creatinine, Ser: 0.94 mg/dL (ref 0.76–1.27)
GFR calc Af Amer: 113 mL/min/{1.73_m2} (ref >59)
GFR calc non Af Amer: 98 mL/min/{1.73_m2} (ref >59)
Globulin, Total: 2.6 g/dL (ref 1.5–4.5)
Glucose: 119 mg/dL — ABNORMAL HIGH (ref 65–99)
Potassium: 4.7 mmol/L (ref 3.5–5.2)
Sodium: 139 mmol/L (ref 136–144)
Total Protein: 7.4 g/dL (ref 6.0–8.5)

## 2014-12-24 LAB — CBC WITH DIFFERENTIAL/PLATELET
Basophils Absolute: 0 10*3/uL (ref 0.0–0.2)
Basos: 0 %
EOS (ABSOLUTE): 0.2 10*3/uL (ref 0.0–0.4)
Eos: 2 %
Hematocrit: 43.5 % (ref 37.5–51.0)
Hemoglobin: 15.3 g/dL (ref 12.6–17.7)
Immature Grans (Abs): 0 10*3/uL (ref 0.0–0.1)
Immature Granulocytes: 0 %
Lymphocytes Absolute: 2.1 10*3/uL (ref 0.7–3.1)
Lymphs: 31 %
MCH: 30.9 pg (ref 26.6–33.0)
MCHC: 35.2 g/dL (ref 31.5–35.7)
MCV: 88 fL (ref 79–97)
Monocytes Absolute: 0.6 10*3/uL (ref 0.1–0.9)
Monocytes: 9 %
Neutrophils Absolute: 3.9 10*3/uL (ref 1.4–7.0)
Neutrophils: 58 %
Platelets: 180 10*3/uL (ref 150–379)
RBC: 4.95 x10E6/uL (ref 4.14–5.80)
RDW: 13.2 % (ref 12.3–15.4)
WBC: 6.8 10*3/uL (ref 3.4–10.8)

## 2014-12-24 LAB — HEMOGLOBIN A1C
Est. average glucose Bld gHb Est-mCnc: 123 mg/dL
Hgb A1c MFr Bld: 5.9 % — ABNORMAL HIGH (ref 4.8–5.6)

## 2014-12-24 MED ORDER — ATORVASTATIN CALCIUM 20 MG PO TABS: 20.0000 mg | ORAL_TABLET | Freq: Every day | ORAL | Status: DC

## 2014-12-24 NOTE — Addendum Note (Signed)
Addended by: Devona Konig on: 12/24/2014 12:26 PM   Modules accepted: Orders

## 2015-01-05 ENCOUNTER — Ambulatory Visit (INDEPENDENT_AMBULATORY_CARE_PROVIDER_SITE_OTHER): Payer: BLUE CROSS/BLUE SHIELD | Admitting: Family Medicine

## 2015-01-05 ENCOUNTER — Encounter: Payer: Self-pay | Admitting: Family Medicine

## 2015-01-05 VITALS — BP 133/69 | HR 80 | Temp 98.7°F | Resp 16 | Ht 76.0 in | Wt 210.0 lb

## 2015-01-05 DIAGNOSIS — R059 Cough, unspecified: Secondary | ICD-10-CM

## 2015-01-05 DIAGNOSIS — J111 Influenza due to unidentified influenza virus with other respiratory manifestations: Secondary | ICD-10-CM | POA: Diagnosis not present

## 2015-01-05 DIAGNOSIS — R05 Cough: Secondary | ICD-10-CM | POA: Diagnosis not present

## 2015-01-05 DIAGNOSIS — E785 Hyperlipidemia, unspecified: Secondary | ICD-10-CM

## 2015-01-05 MED ORDER — ATORVASTATIN CALCIUM 20 MG PO TABS: 20.0000 mg | ORAL_TABLET | Freq: Every day | ORAL | Status: DC

## 2015-01-05 MED ORDER — DM-GUAIFENESIN ER 30-600 MG PO TB12
1.0000 | ORAL_TABLET | Freq: Two times a day (BID) | ORAL | Status: DC
Start: 2015-01-05 — End: 2015-03-09

## 2015-01-05 MED ORDER — AZITHROMYCIN 250 MG PO TABS: ORAL_TABLET | ORAL | Status: DC

## 2015-01-05 MED ORDER — OSELTAMIVIR PHOSPHATE 75 MG PO CAPS: 75.0000 mg | ORAL_CAPSULE | Freq: Two times a day (BID) | ORAL | Status: DC

## 2015-01-05 NOTE — Progress Notes (Signed)
Name: Isaiah Doyle.   MRN: [id number]    DOB: 1970-10-28   Date:01/05/2015       Progress Note  Subjective  Chief Complaint  Chief Complaint  Patient presents with  . Cough    bodyache, chills, productive cough x 2 days- some fever 102    HPI Sudden onset 2 days ago of fever, chills, body aches, sore throat , hacky cough, headache   Cough now dark sputum.  Did not get a flu shot this fall.  He is alternationg Tylenol/ Advil. No problem-specific assessment & plan notes found for this encounter.   Past Medical History  Diagnosis Date  . Diabetes mellitus without complication ([place])   . Allergy   . GERD (gastroesophageal reflux disease)   . Chronic kidney disease     kidney stones    Social History  Substance Use Topics  . Smoking status: Former Research scientist (life sciences)  . Smokeless tobacco: Never Used  . Alcohol Use: 7.2 oz/week    12 Cans of beer per week     Current outpatient prescriptions:  .  acetaminophen (TYLENOL) 500 MG tablet, Take 500 mg by mouth every 6 (six) hours as needed., Disp: , Rfl:  .  atorvastatin (LIPITOR) 20 MG tablet, Take 1 tablet (20 mg total) by mouth daily., Disp: 30 tablet, Rfl: 6 .  ibuprofen (ADVIL,MOTRIN) 200 MG tablet, Take 200 mg by mouth every 6 (six) hours as needed., Disp: , Rfl:  .  Multiple Vitamin (MULTIVITAMIN) tablet, Take 1 tablet by mouth daily., Disp: , Rfl:  .  omeprazole (PRILOSEC) 20 MG capsule, Take 1 capsule (20 mg total) by mouth daily., Disp: 90 capsule, Rfl: 3 .  azithromycin (ZITHROMAX) 250 MG tablet, Take 2 tabs on day 1, then 1 tab on days 2-5, Disp: 6 tablet, Rfl: 0 .  dextromethorphan-guaiFENesin (MUCINEX DM) 30-600 MG 12hr tablet, Take 1 tablet by mouth 2 (two) times daily., Disp: 20 tablet, Rfl: 0 .  oseltamivir (TAMIFLU) 75 MG capsule, Take 1 capsule (75 mg total) by mouth 2 (two) times daily., Disp: 10 capsule, Rfl: 0  Allergies  Allergen Reactions  . Erythromycin Nausea And Vomiting    Review of Systems  Constitutional:  Positive for malaise/fatigue. Negative for fever and chills.  HENT: Positive for congestion and sore throat.   Eyes: Negative for blurred vision and double vision.  Respiratory: Positive for cough, sputum production and shortness of breath. Negative for wheezing.   Cardiovascular: Negative for chest pain, palpitations and leg swelling.  Gastrointestinal: Negative for heartburn, nausea, vomiting, abdominal pain, diarrhea, constipation and blood in stool.  Genitourinary: Negative for dysuria, urgency and frequency.  Musculoskeletal: Positive for myalgias.  Skin: Negative for rash.  Neurological: Positive for dizziness and headaches. Negative for tremors and weakness.      Objective  Filed Vitals:   01/05/15 1457  BP: 133/69  Pulse: 80  Temp: 98.7 F (37.1 C)  Resp: 16  Height: 6' 4"  (1.93 m)  Weight: 210 lb (95.255 kg)     Physical Exam  Constitutional: He is oriented to person, place, and time. He appears distressed (Appears to feel ill.).  HENT:  Head: Normocephalic and atraumatic.  Right Ear: External ear normal.  Left Ear: External ear normal.  Nose: Mucosal edema and rhinorrhea present. Right sinus exhibits no maxillary sinus tenderness and no frontal sinus tenderness. Left sinus exhibits no maxillary sinus tenderness and no frontal sinus tenderness.  Mouth/Throat: Posterior oropharyngeal erythema (mild diffuse redness of post pharynx) present.  Neck: Normal range of motion. Neck supple.  Cardiovascular: Normal rate, regular rhythm and normal heart sounds.  Exam reveals no gallop and no friction rub.   No murmur heard. Pulmonary/Chest: Effort normal and breath sounds normal. No respiratory distress. He has no wheezes. He has no rales.  Musculoskeletal: He exhibits no edema.  Lymphadenopathy:    He has no cervical adenopathy.  Neurological: He is alert and oriented to person, place, and time.  Skin: Skin is warm and dry. No rash noted.  Vitals reviewed.     Recent  Results (from the past 2160 hour(s))  Urinalysis complete, with microscopic ([facility] only)     Status: Abnormal   Collection Time: 11/10/14 12:14 AM  Result Value Ref Range   Color, Urine YELLOW (A) YELLOW   APPearance CLEAR (A) CLEAR   Glucose, UA NEGATIVE NEGATIVE mg/dL   Bilirubin Urine NEGATIVE NEGATIVE   Ketones, ur NEGATIVE NEGATIVE mg/dL   Specific Gravity, Urine 1.023 1.005 - 1.030   Hgb urine dipstick NEGATIVE NEGATIVE   pH 5.0 5.0 - 8.0   Protein, ur NEGATIVE NEGATIVE mg/dL   Nitrite NEGATIVE NEGATIVE   Leukocytes, UA NEGATIVE NEGATIVE   RBC / HPF 0-5 0 - 5 RBC/hpf   WBC, UA 0-5 0 - 5 WBC/hpf   Bacteria, UA NONE SEEN NONE SEEN   Squamous Epithelial / LPF 0-5 (A) NONE SEEN   Mucous PRESENT   Lipase, blood     Status: None   Collection Time: 11/10/14  4:48 AM  Result Value Ref Range   Lipase 27 22 - 51 U/L  Comprehensive metabolic panel     Status: Abnormal   Collection Time: 11/10/14  4:48 AM  Result Value Ref Range   Sodium 139 135 - 145 mmol/L   Potassium 4.0 3.5 - 5.1 mmol/L   Chloride 107 101 - 111 mmol/L   CO2 26 22 - 32 mmol/L   Glucose, Bld 131 (H) 65 - 99 mg/dL   BUN 14 6 - 20 mg/dL   Creatinine, Ser 0.98 0.61 - 1.24 mg/dL   Calcium 9.2 8.9 - 10.3 mg/dL   Total Protein 7.8 6.5 - 8.1 g/dL   Albumin 4.7 3.5 - 5.0 g/dL   AST 21 15 - 41 U/L   ALT 22 17 - 63 U/L   Alkaline Phosphatase 63 38 - 126 U/L   Total Bilirubin 1.1 0.3 - 1.2 mg/dL   GFR calc non Af Amer >60 >60 mL/min   GFR calc Af Amer >60 >60 mL/min    Comment: (NOTE) The eGFR has been calculated using the CKD EPI equation. This calculation has not been validated in all clinical situations. eGFR's persistently <60 mL/min signify possible Chronic Kidney Disease.    Anion gap 6 5 - 15  CBC     Status: Abnormal   Collection Time: 11/10/14  4:48 AM  Result Value Ref Range   WBC 10.6 3.8 - 10.6 K/uL   RBC 4.54 4.40 - 5.90 MIL/uL   Hemoglobin 13.9 13.0 - 18.0 g/dL   HCT 39.9 (L) 40.0 - 52.0 %   MCV  87.9 80.0 - 100.0 fL   MCH 30.6 26.0 - 34.0 pg   MCHC 34.8 32.0 - 36.0 g/dL   RDW 12.6 11.5 - 14.5 %   Platelets 164 150 - 440 K/uL  Troponin I     Status: None   Collection Time: 11/10/14  4:48 AM  Result Value Ref Range   Troponin I <0.03 <0.031 ng/mL  Comment:        NO INDICATION OF MYOCARDIAL INJURY.   Glucose, capillary     Status: Abnormal   Collection Time: 11/10/14  4:53 AM  Result Value Ref Range   Glucose-Capillary 126 (H) 65 - 99 mg/dL   Comment 1 Notify RN   Surgical pathology     Status: None   Collection Time: 11/13/14  3:13 PM  Result Value Ref Range   SURGICAL PATHOLOGY      Surgical Pathology CASE: [id number] PATIENT: Marlowe Sax Surgical Pathology Report     SPECIMEN SUBMITTED: A. Stomach, cbx B. Esophagus, cbx  CLINICAL HISTORY: None provided  PRE-OPERATIVE DIAGNOSIS: ABD pain  POST-OPERATIVE DIAGNOSIS: None provided     DIAGNOSIS: A. STOMACH; COLD BIOPSY: - OXYNTIC MUCOSA WITH MILD CHRONIC GASTRITIS, NONSPECIFIC. - NEGATIVE FOR H. PYLORI, DYSPLASIA, AND MALIGNANCY.  B. ESOPHAGUS; COLD BIOPSY: - GASTRIC CARDIAC TYPE MUCOSA WITH CHANGES SUGGESTIVE OF REFLUX. - FRAGMENTS OF UNREMARKABLE SQUAMOUS MUCOSA. - NEGATIVE FOR DYSPLASIA AND MALIGNANCY.   GROSS DESCRIPTION: A. Labeled: Cold biopsies stomach  Tissue fragment(s): 2  Size: 0.2-0.8 cm  Description: Tan tissue fragments  Entirely submitted in one cassette(s).   B. Labeled: Cold biopsy esophagus  Tissue fragment(s): 2  Size: 0.3-0.7 cm  Description: Pale white tissue fragments  Entirely submitted in one cassette(s).     Final Diagnosis perf ormed by Quay Burow, MD.  Electronically signed 11/17/2014 12:01:00PM    The electronic signature indicates that the named Attending Pathologist has evaluated the specimen  Technical component performed at [facility], [address], [place], [place] [address] Lab: [phone] Dir: Darrick Penna. Evette Doffing,  MD  Professional component performed at [facility], [facility], [place], [place], [place] [address] Lab: [phone] Dir: Dellia Nims. Rubinas, MD    Comprehensive Metabolic Panel (CMET)     Status: Abnormal   Collection Time: 12/23/14  8:15 AM  Result Value Ref Range   Glucose 119 (H) 65 - 99 mg/dL   BUN 19 6 - 24 mg/dL   Creatinine, Ser 0.94 0.76 - 1.27 mg/dL   GFR calc non Af Amer 98 >59 mL/min/1.73   GFR calc Af Amer 113 >59 mL/min/1.73   BUN/Creatinine Ratio 20 9 - 20   Sodium 139 136 - 144 mmol/L    Comment: **Effective January 05, 2015 the reference interval**   for Sodium, Serum will be changing to:                                             134 - 144    Potassium 4.7 3.5 - 5.2 mmol/L   Chloride 101 97 - 106 mmol/L    Comment: **Effective January 05, 2015 the reference interval**   for Chloride, Serum will be changing to:                                              96 - 106    CO2 25 18 - 29 mmol/L   Calcium 9.8 8.7 - 10.2 mg/dL   Total Protein 7.4 6.0 - 8.5 g/dL   Albumin 4.8 3.5 - 5.5 g/dL   Globulin, Total 2.6 1.5 - 4.5 g/dL   Albumin/Globulin Ratio 1.8 1.1 - 2.5   Bilirubin Total 0.6 0.0 -  1.2 mg/dL   Alkaline Phosphatase 69 39 - 117 IU/L   AST 22 0 - 40 IU/L   ALT 30 0 - 44 IU/L  CBC with Differential     Status: None   Collection Time: 12/23/14  8:15 AM  Result Value Ref Range   WBC 6.8 3.4 - 10.8 x10E3/uL   RBC 4.95 4.14 - 5.80 x10E6/uL   Hemoglobin 15.3 12.6 - 17.7 g/dL   Hematocrit 43.5 37.5 - 51.0 %   MCV 88 79 - 97 fL   MCH 30.9 26.6 - 33.0 pg   MCHC 35.2 31.5 - 35.7 g/dL   RDW 13.2 12.3 - 15.4 %   Platelets 180 150 - 379 x10E3/uL   Neutrophils 58 %   Lymphs 31 %   Monocytes 9 %   Eos 2 %   Basos 0 %   Neutrophils Absolute 3.9 1.4 - 7.0 x10E3/uL   Lymphocytes Absolute 2.1 0.7 - 3.1 x10E3/uL   Monocytes Absolute 0.6 0.1 - 0.9 x10E3/uL   EOS (ABSOLUTE) 0.2 0.0 - 0.4 x10E3/uL   Basophils Absolute 0.0 0.0 - 0.2  x10E3/uL   Immature Granulocytes 0 %   Immature Grans (Abs) 0.0 0.0 - 0.1 x10E3/uL  Lipid Profile     Status: Abnormal   Collection Time: 12/23/14  8:15 AM  Result Value Ref Range   Cholesterol, Total 217 (H) 100 - 199 mg/dL   Triglycerides 139 0 - 149 mg/dL   HDL 51 >39 mg/dL   VLDL Cholesterol Cal 28 5 - 40 mg/dL   LDL Calculated 138 (H) 0 - 99 mg/dL   Chol/HDL Ratio 4.3 0.0 - 5.0 ratio units    Comment:                                   T. Chol/HDL Ratio                                             Men  Women                               1/2 Avg.Risk  3.4    3.3                                   Avg.Risk  5.0    4.4                                2X Avg.Risk  9.6    7.1                                3X Avg.Risk 23.4   11.0   HgB A1c     Status: Abnormal   Collection Time: 12/23/14  8:15 AM  Result Value Ref Range   Hgb A1c MFr Bld 5.9 (H) 4.8 - 5.6 %    Comment:          Pre-diabetes: 5.7 - 6.4          Diabetes: >6.4  Glycemic control for adults with diabetes: <7.0    Est. average glucose Bld gHb Est-mCnc 123 mg/dL     Assessment & Plan  1. Cough  - POCT Influenza A/B -neg - POCT rapid strep A -neg - dextromethorphan-guaiFENesin (MUCINEX DM) 30-600 MG 12hr tablet; Take 1 tablet by mouth 2 (two) times daily.  Dispense: 20 tablet; Refill: 0  2. Influenza  - oseltamivir (TAMIFLU) 75 MG capsule; Take 1 capsule (75 mg total) by mouth 2 (two) times daily.  Dispense: 10 capsule; Refill: 0 - azithromycin (ZITHROMAX) 250 MG tablet; Take 2 tabs on day 1, then 1 tab on days 2-5  Dispense: 6 tablet; Refill: 0  3. Elevated lipids  - atorvastatin (LIPITOR) 20 MG tablet; Take 1 tablet (20 mg total) by mouth daily.  Dispense: 30 tablet; Refill: 6

## 2015-01-05 NOTE — Patient Instructions (Signed)
Note for out of work Mon thru Fri this week due to influenza.

## 2015-01-06 LAB — POCT INFLUENZA A/B
Influenza A, POC: NEGATIVE
Influenza B, POC: NEGATIVE

## 2015-01-06 LAB — POCT RAPID STREP A (OFFICE): Rapid Strep A Screen: NEGATIVE

## 2015-03-09 ENCOUNTER — Ambulatory Visit (INDEPENDENT_AMBULATORY_CARE_PROVIDER_SITE_OTHER): Payer: BLUE CROSS/BLUE SHIELD | Admitting: Family Medicine

## 2015-03-09 ENCOUNTER — Encounter: Payer: Self-pay | Admitting: Family Medicine

## 2015-03-09 VITALS — BP 131/76 | HR 76 | Resp 16 | Ht 72.0 in | Wt 222.0 lb

## 2015-03-09 DIAGNOSIS — E119 Type 2 diabetes mellitus without complications: Secondary | ICD-10-CM

## 2015-03-09 DIAGNOSIS — E785 Hyperlipidemia, unspecified: Secondary | ICD-10-CM

## 2015-03-09 LAB — POCT GLYCOSYLATED HEMOGLOBIN (HGB A1C): Hemoglobin A1C: 5.5

## 2015-03-09 NOTE — Progress Notes (Signed)
Name: Isaiah Doyle.   MRN: [id number]    DOB: [birth year]   Date:03/09/2015       Progress Note  Subjective  Chief Complaint  Chief Complaint  Patient presents with  . Diabetes    HPI Here for f/u of DM.  He is eating differently, but has gained weight.  He is feeling well overall.  No problem-specific assessment & plan notes found for this encounter.   Past Medical History  Diagnosis Date  . Diabetes mellitus without complication ([place])   . Allergy   . GERD (gastroesophageal reflux disease)   . Chronic kidney disease     kidney stones    Past Surgical History  Procedure Laterality Date  . Tonsillectomy    . Wrist mass excision    . Colonoscopy    . Esophagogastroduodenoscopy (egd) with propofol N/A 11/13/2014    Procedure: ESOPHAGOGASTRODUODENOSCOPY (EGD) WITH PROPOFOL;  Surgeon: Hulen Luster, MD;  Location: [facility] ENDOSCOPY;  Service: Gastroenterology;  Laterality: N/A;    Family History  Problem Relation Age of Onset  . Cancer Mother     BREAST  . Heart attack Father   . Heart disease Father     Social History   Social History  . Marital Status: Married    Spouse Name: N/A  . Number of Children: N/A  . Years of Education: N/A   Occupational History  . Not on file.   Social History Main Topics  . Smoking status: Former Research scientist (life sciences)  . Smokeless tobacco: Never Used  . Alcohol Use: 7.2 oz/week    12 Cans of beer per week  . Drug Use: No  . Sexual Activity: Not on file   Other Topics Concern  . Not on file   Social History Narrative     Current outpatient prescriptions:  .  acetaminophen (TYLENOL) 500 MG tablet, Take 500 mg by mouth every 6 (six) hours as needed., Disp: , Rfl:  .  atorvastatin (LIPITOR) 20 MG tablet, Take 1 tablet (20 mg total) by mouth daily., Disp: 30 tablet, Rfl: 6 .  ibuprofen (ADVIL,MOTRIN) 200 MG tablet, Take 200 mg by mouth every 6 (six) hours as needed., Disp: , Rfl:  .  Multiple Vitamin (MULTIVITAMIN) tablet, Take 1 tablet by  mouth daily., Disp: , Rfl:  .  omeprazole (PRILOSEC) 20 MG capsule, Take 1 capsule (20 mg total) by mouth daily., Disp: 90 capsule, Rfl: 3  Allergies  Allergen Reactions  . Erythromycin Nausea And Vomiting     Review of Systems  Constitutional: Negative for fever, chills, weight loss and malaise/fatigue.  HENT: Negative for hearing loss.   Eyes: Negative for blurred vision and double vision.  Respiratory: Negative for cough, shortness of breath and wheezing.   Cardiovascular: Negative for chest pain, palpitations and leg swelling.  Gastrointestinal: Negative for heartburn, abdominal pain and blood in stool.  Genitourinary: Negative for dysuria, urgency and frequency.  Musculoskeletal: Negative for myalgias and joint pain.  Skin: Negative for rash.  Neurological: Negative for dizziness, tremors, weakness and headaches.  Psychiatric/Behavioral: Negative for depression. The patient is not nervous/anxious.       Objective  Filed Vitals:   03/09/15 1606  BP: 131/76  Pulse: 76  Resp: 16  Height: 6' (1.829 m)  Weight: 222 lb (100.699 kg)    Physical Exam  Constitutional: He is oriented to person, place, and time and well-developed, well-nourished, and in no distress. No distress.  HENT:  Head: Normocephalic and atraumatic.  Eyes: Conjunctivae  and EOM are normal. Pupils are equal, round, and reactive to light. No scleral icterus.  Neck: Normal range of motion. Neck supple. Carotid bruit is not present. No thyromegaly present.  Cardiovascular: Normal rate, regular rhythm and normal heart sounds.  Exam reveals no gallop and no friction rub.   No murmur heard. Pulmonary/Chest: Effort normal and breath sounds normal. No respiratory distress. He has no wheezes. He has no rales.  Abdominal: Soft. Bowel sounds are normal. He exhibits no distension and no mass. There is no tenderness.  Musculoskeletal: He exhibits no edema.  Lymphadenopathy:    He has no cervical adenopathy.   Neurological: He is alert and oriented to person, place, and time.  Vitals reviewed.      Recent Results (from the past 2160 hour(s))  Comprehensive Metabolic Panel (CMET)     Status: Abnormal   Collection Time: 12/23/14  8:15 AM  Result Value Ref Range   Glucose 119 (H) 65 - 99 mg/dL   BUN 19 6 - 24 mg/dL   Creatinine, Ser 0.94 0.76 - 1.27 mg/dL   GFR calc non Af Amer 98 >59 mL/min/1.73   GFR calc Af Amer 113 >59 mL/min/1.73   BUN/Creatinine Ratio 20 9 - 20   Sodium 139 136 - 144 mmol/L    Comment: **Effective January 05, 2015 the reference interval**   for Sodium, Serum will be changing to:                                             134 - 144    Potassium 4.7 3.5 - 5.2 mmol/L   Chloride 101 97 - 106 mmol/L    Comment: **Effective January 05, 2015 the reference interval**   for Chloride, Serum will be changing to:                                              96 - 106    CO2 25 18 - 29 mmol/L   Calcium 9.8 8.7 - 10.2 mg/dL   Total Protein 7.4 6.0 - 8.5 g/dL   Albumin 4.8 3.5 - 5.5 g/dL   Globulin, Total 2.6 1.5 - 4.5 g/dL   Albumin/Globulin Ratio 1.8 1.1 - 2.5   Bilirubin Total 0.6 0.0 - 1.2 mg/dL   Alkaline Phosphatase 69 39 - 117 IU/L   AST 22 0 - 40 IU/L   ALT 30 0 - 44 IU/L  CBC with Differential     Status: None   Collection Time: 12/23/14  8:15 AM  Result Value Ref Range   WBC 6.8 3.4 - 10.8 x10E3/uL   RBC 4.95 4.14 - 5.80 x10E6/uL   Hemoglobin 15.3 12.6 - 17.7 g/dL   Hematocrit 43.5 37.5 - 51.0 %   MCV 88 79 - 97 fL   MCH 30.9 26.6 - 33.0 pg   MCHC 35.2 31.5 - 35.7 g/dL   RDW 13.2 12.3 - 15.4 %   Platelets 180 150 - 379 x10E3/uL   Neutrophils 58 %   Lymphs 31 %   Monocytes 9 %   Eos 2 %   Basos 0 %   Neutrophils Absolute 3.9 1.4 - 7.0 x10E3/uL   Lymphocytes Absolute 2.1 0.7 - 3.1 x10E3/uL   Monocytes Absolute 0.6  0.1 - 0.9 x10E3/uL   EOS (ABSOLUTE) 0.2 0.0 - 0.4 x10E3/uL   Basophils Absolute 0.0 0.0 - 0.2 x10E3/uL   Immature Granulocytes 0 %    Immature Grans (Abs) 0.0 0.0 - 0.1 x10E3/uL  Lipid Profile     Status: Abnormal   Collection Time: 12/23/14  8:15 AM  Result Value Ref Range   Cholesterol, Total 217 (H) 100 - 199 mg/dL   Triglycerides 139 0 - 149 mg/dL   HDL 51 >39 mg/dL   VLDL Cholesterol Cal 28 5 - 40 mg/dL   LDL Calculated 138 (H) 0 - 99 mg/dL   Chol/HDL Ratio 4.3 0.0 - 5.0 ratio units    Comment:                                   T. Chol/HDL Ratio                                             Men  Women                               1/2 Avg.Risk  3.4    3.3                                   Avg.Risk  5.0    4.4                                2X Avg.Risk  9.6    7.1                                3X Avg.Risk 23.4   11.0   HgB A1c     Status: Abnormal   Collection Time: 12/23/14  8:15 AM  Result Value Ref Range   Hgb A1c MFr Bld 5.9 (H) 4.8 - 5.6 %    Comment:          Pre-diabetes: 5.7 - 6.4          Diabetes: >6.4          Glycemic control for adults with diabetes: <7.0    Est. average glucose Bld gHb Est-mCnc 123 mg/dL  POCT Influenza A/B     Status: Normal   Collection Time: 01/06/15  9:06 AM  Result Value Ref Range   Influenza A, POC Negative Negative    Comment: Treated w/ Tamiflu    Influenza B, POC Negative Negative  POCT rapid strep A     Status: Normal   Collection Time: 01/06/15  9:06 AM  Result Value Ref Range   Rapid Strep A Screen Negative Negative  POCT HgB A1C     Status: Normal   Collection Time: 03/09/15  4:25 PM  Result Value Ref Range   Hemoglobin A1C 5.5      Assessment & Plan  Problem List Items Addressed This Visit      Endocrine   T2DM (type 2 diabetes mellitus) ([place]) - Primary   Relevant Orders   POCT HgB A1C (Completed)   Comprehensive Metabolic Panel (CMET)  Other   Elevated lipids   Relevant Orders   Lipid Profile      No orders of the defined types were placed in this encounter.   1. Type 2 diabetes mellitus without complication, unspecified long term  insulin use status ([facility]) - POCT HgB A1C - Comprehensive Metabolic Panel (CMET) Discussed weight loss 2. Elevated lipids  - Lipid Profile

## 2015-04-22 LAB — LIPID PANEL
Chol/HDL Ratio: 3.7 ratio units (ref 0.0–5.0)
Cholesterol, Total: 174 mg/dL (ref 100–199)
HDL: 47 mg/dL (ref >39)
LDL Calculated: 72 mg/dL (ref 0–99)
Triglycerides: 275 mg/dL — ABNORMAL HIGH (ref 0–149)
VLDL Cholesterol Cal: 55 mg/dL — ABNORMAL HIGH (ref 5–40)

## 2015-04-22 LAB — COMPREHENSIVE METABOLIC PANEL
ALT: 38 IU/L (ref 0–44)
AST: 27 IU/L (ref 0–40)
Albumin/Globulin Ratio: 2.2 (ref 1.2–2.2)
Albumin: 5 g/dL (ref 3.5–5.5)
Alkaline Phosphatase: 69 IU/L (ref 39–117)
BUN/Creatinine Ratio: 23 — ABNORMAL HIGH (ref 9–20)
BUN: 19 mg/dL (ref 6–24)
Bilirubin Total: 0.8 mg/dL (ref 0.0–1.2)
CO2: 22 mmol/L (ref 18–29)
Calcium: 9.3 mg/dL (ref 8.7–10.2)
Chloride: 102 mmol/L (ref 96–106)
Creatinine, Ser: 0.81 mg/dL (ref 0.76–1.27)
GFR calc Af Amer: 124 mL/min/{1.73_m2} (ref >59)
GFR calc non Af Amer: 107 mL/min/{1.73_m2} (ref >59)
Globulin, Total: 2.3 g/dL (ref 1.5–4.5)
Glucose: 114 mg/dL — ABNORMAL HIGH (ref 65–99)
Potassium: 4.1 mmol/L (ref 3.5–5.2)
Sodium: 142 mmol/L (ref 134–144)
Total Protein: 7.3 g/dL (ref 6.0–8.5)

## 2015-07-13 DIAGNOSIS — H40003 Preglaucoma, unspecified, bilateral: Secondary | ICD-10-CM | POA: Diagnosis not present

## 2016-03-07 DIAGNOSIS — M9902 Segmental and somatic dysfunction of thoracic region: Secondary | ICD-10-CM | POA: Diagnosis not present

## 2016-03-07 DIAGNOSIS — M531 Cervicobrachial syndrome: Secondary | ICD-10-CM | POA: Diagnosis not present

## 2016-05-30 DIAGNOSIS — M531 Cervicobrachial syndrome: Secondary | ICD-10-CM | POA: Diagnosis not present

## 2016-05-30 DIAGNOSIS — M9903 Segmental and somatic dysfunction of lumbar region: Secondary | ICD-10-CM | POA: Diagnosis not present

## 2016-05-30 DIAGNOSIS — M9902 Segmental and somatic dysfunction of thoracic region: Secondary | ICD-10-CM | POA: Diagnosis not present

## 2016-05-30 DIAGNOSIS — M5386 Other specified dorsopathies, lumbar region: Secondary | ICD-10-CM | POA: Diagnosis not present

## 2016-07-16 DIAGNOSIS — M62830 Muscle spasm of back: Secondary | ICD-10-CM | POA: Diagnosis not present

## 2016-07-16 DIAGNOSIS — M545 Low back pain: Secondary | ICD-10-CM | POA: Diagnosis not present

## 2016-07-16 DIAGNOSIS — M9904 Segmental and somatic dysfunction of sacral region: Secondary | ICD-10-CM | POA: Diagnosis not present

## 2016-07-16 DIAGNOSIS — M9903 Segmental and somatic dysfunction of lumbar region: Secondary | ICD-10-CM | POA: Diagnosis not present

## 2016-07-18 DIAGNOSIS — M62830 Muscle spasm of back: Secondary | ICD-10-CM | POA: Diagnosis not present

## 2016-07-18 DIAGNOSIS — M545 Low back pain: Secondary | ICD-10-CM | POA: Diagnosis not present

## 2016-07-18 DIAGNOSIS — M9904 Segmental and somatic dysfunction of sacral region: Secondary | ICD-10-CM | POA: Diagnosis not present

## 2016-07-18 DIAGNOSIS — M9903 Segmental and somatic dysfunction of lumbar region: Secondary | ICD-10-CM | POA: Diagnosis not present

## 2016-08-01 DIAGNOSIS — M9903 Segmental and somatic dysfunction of lumbar region: Secondary | ICD-10-CM | POA: Diagnosis not present

## 2016-08-01 DIAGNOSIS — M9904 Segmental and somatic dysfunction of sacral region: Secondary | ICD-10-CM | POA: Diagnosis not present

## 2016-08-01 DIAGNOSIS — M545 Low back pain: Secondary | ICD-10-CM | POA: Diagnosis not present

## 2016-08-01 DIAGNOSIS — M62830 Muscle spasm of back: Secondary | ICD-10-CM | POA: Diagnosis not present

## 2016-08-02 DIAGNOSIS — M545 Low back pain: Secondary | ICD-10-CM | POA: Diagnosis not present

## 2016-08-02 DIAGNOSIS — M9903 Segmental and somatic dysfunction of lumbar region: Secondary | ICD-10-CM | POA: Diagnosis not present

## 2016-08-02 DIAGNOSIS — M62830 Muscle spasm of back: Secondary | ICD-10-CM | POA: Diagnosis not present

## 2016-08-02 DIAGNOSIS — M9904 Segmental and somatic dysfunction of sacral region: Secondary | ICD-10-CM | POA: Diagnosis not present

## 2016-08-03 DIAGNOSIS — M9904 Segmental and somatic dysfunction of sacral region: Secondary | ICD-10-CM | POA: Diagnosis not present

## 2016-08-03 DIAGNOSIS — M545 Low back pain: Secondary | ICD-10-CM | POA: Diagnosis not present

## 2016-08-03 DIAGNOSIS — M9903 Segmental and somatic dysfunction of lumbar region: Secondary | ICD-10-CM | POA: Diagnosis not present

## 2016-08-03 DIAGNOSIS — M62830 Muscle spasm of back: Secondary | ICD-10-CM | POA: Diagnosis not present

## 2016-08-04 DIAGNOSIS — M62830 Muscle spasm of back: Secondary | ICD-10-CM | POA: Diagnosis not present

## 2016-08-04 DIAGNOSIS — M545 Low back pain: Secondary | ICD-10-CM | POA: Diagnosis not present

## 2016-08-04 DIAGNOSIS — M9904 Segmental and somatic dysfunction of sacral region: Secondary | ICD-10-CM | POA: Diagnosis not present

## 2016-08-04 DIAGNOSIS — M9903 Segmental and somatic dysfunction of lumbar region: Secondary | ICD-10-CM | POA: Diagnosis not present

## 2016-08-08 DIAGNOSIS — M9903 Segmental and somatic dysfunction of lumbar region: Secondary | ICD-10-CM | POA: Diagnosis not present

## 2016-08-08 DIAGNOSIS — M545 Low back pain: Secondary | ICD-10-CM | POA: Diagnosis not present

## 2016-08-08 DIAGNOSIS — M62830 Muscle spasm of back: Secondary | ICD-10-CM | POA: Diagnosis not present

## 2016-08-08 DIAGNOSIS — M9904 Segmental and somatic dysfunction of sacral region: Secondary | ICD-10-CM | POA: Diagnosis not present

## 2016-08-15 DIAGNOSIS — M9903 Segmental and somatic dysfunction of lumbar region: Secondary | ICD-10-CM | POA: Diagnosis not present

## 2016-08-15 DIAGNOSIS — M62830 Muscle spasm of back: Secondary | ICD-10-CM | POA: Diagnosis not present

## 2016-08-15 DIAGNOSIS — M9904 Segmental and somatic dysfunction of sacral region: Secondary | ICD-10-CM | POA: Diagnosis not present

## 2016-08-15 DIAGNOSIS — M545 Low back pain: Secondary | ICD-10-CM | POA: Diagnosis not present

## 2016-08-16 DIAGNOSIS — M545 Low back pain: Secondary | ICD-10-CM | POA: Diagnosis not present

## 2016-08-16 DIAGNOSIS — M62830 Muscle spasm of back: Secondary | ICD-10-CM | POA: Diagnosis not present

## 2016-08-16 DIAGNOSIS — M9904 Segmental and somatic dysfunction of sacral region: Secondary | ICD-10-CM | POA: Diagnosis not present

## 2016-08-16 DIAGNOSIS — M9903 Segmental and somatic dysfunction of lumbar region: Secondary | ICD-10-CM | POA: Diagnosis not present

## 2016-08-24 DIAGNOSIS — M545 Low back pain: Secondary | ICD-10-CM | POA: Diagnosis not present

## 2016-08-24 DIAGNOSIS — M9903 Segmental and somatic dysfunction of lumbar region: Secondary | ICD-10-CM | POA: Diagnosis not present

## 2016-08-24 DIAGNOSIS — M62830 Muscle spasm of back: Secondary | ICD-10-CM | POA: Diagnosis not present

## 2016-08-24 DIAGNOSIS — M9904 Segmental and somatic dysfunction of sacral region: Secondary | ICD-10-CM | POA: Diagnosis not present

## 2016-11-08 DIAGNOSIS — M62830 Muscle spasm of back: Secondary | ICD-10-CM | POA: Diagnosis not present

## 2016-11-08 DIAGNOSIS — M545 Low back pain: Secondary | ICD-10-CM | POA: Diagnosis not present

## 2016-11-08 DIAGNOSIS — M9904 Segmental and somatic dysfunction of sacral region: Secondary | ICD-10-CM | POA: Diagnosis not present

## 2016-11-08 DIAGNOSIS — M9903 Segmental and somatic dysfunction of lumbar region: Secondary | ICD-10-CM | POA: Diagnosis not present

## 2016-12-14 DIAGNOSIS — M62830 Muscle spasm of back: Secondary | ICD-10-CM | POA: Diagnosis not present

## 2016-12-14 DIAGNOSIS — M9904 Segmental and somatic dysfunction of sacral region: Secondary | ICD-10-CM | POA: Diagnosis not present

## 2016-12-14 DIAGNOSIS — M545 Low back pain: Secondary | ICD-10-CM | POA: Diagnosis not present

## 2016-12-14 DIAGNOSIS — M9903 Segmental and somatic dysfunction of lumbar region: Secondary | ICD-10-CM | POA: Diagnosis not present

## 2017-02-08 DIAGNOSIS — M545 Low back pain: Secondary | ICD-10-CM | POA: Diagnosis not present

## 2017-02-08 DIAGNOSIS — M9903 Segmental and somatic dysfunction of lumbar region: Secondary | ICD-10-CM | POA: Diagnosis not present

## 2017-02-08 DIAGNOSIS — M62830 Muscle spasm of back: Secondary | ICD-10-CM | POA: Diagnosis not present

## 2017-02-08 DIAGNOSIS — M9904 Segmental and somatic dysfunction of sacral region: Secondary | ICD-10-CM | POA: Diagnosis not present

## 2017-02-19 DIAGNOSIS — J0140 Acute pansinusitis, unspecified: Secondary | ICD-10-CM | POA: Diagnosis not present

## 2017-04-26 DIAGNOSIS — M9904 Segmental and somatic dysfunction of sacral region: Secondary | ICD-10-CM | POA: Diagnosis not present

## 2017-04-26 DIAGNOSIS — M9903 Segmental and somatic dysfunction of lumbar region: Secondary | ICD-10-CM | POA: Diagnosis not present

## 2017-04-26 DIAGNOSIS — M62830 Muscle spasm of back: Secondary | ICD-10-CM | POA: Diagnosis not present

## 2017-04-26 DIAGNOSIS — M545 Low back pain: Secondary | ICD-10-CM | POA: Diagnosis not present

## 2017-05-16 DIAGNOSIS — M9903 Segmental and somatic dysfunction of lumbar region: Secondary | ICD-10-CM | POA: Diagnosis not present

## 2017-05-16 DIAGNOSIS — M9904 Segmental and somatic dysfunction of sacral region: Secondary | ICD-10-CM | POA: Diagnosis not present

## 2017-05-16 DIAGNOSIS — M545 Low back pain: Secondary | ICD-10-CM | POA: Diagnosis not present

## 2017-05-16 DIAGNOSIS — M62830 Muscle spasm of back: Secondary | ICD-10-CM | POA: Diagnosis not present

## 2017-05-27 DIAGNOSIS — R1030 Lower abdominal pain, unspecified: Secondary | ICD-10-CM | POA: Diagnosis not present

## 2017-05-27 DIAGNOSIS — R350 Frequency of micturition: Secondary | ICD-10-CM | POA: Diagnosis not present

## 2017-05-29 ENCOUNTER — Encounter (INDEPENDENT_AMBULATORY_CARE_PROVIDER_SITE_OTHER): Payer: Self-pay

## 2017-05-29 ENCOUNTER — Ambulatory Visit: Payer: BLUE CROSS/BLUE SHIELD | Admitting: Family Medicine

## 2017-05-29 ENCOUNTER — Encounter: Payer: Self-pay | Admitting: Family Medicine

## 2017-05-29 VITALS — BP 139/86 | HR 64 | Temp 98.3°F | Resp 16 | Ht 76.0 in | Wt 256.6 lb

## 2017-05-29 DIAGNOSIS — N200 Calculus of kidney: Secondary | ICD-10-CM

## 2017-05-29 MED ORDER — TAMSULOSIN HCL 0.4 MG PO CAPS
0.4000 mg | ORAL_CAPSULE | Freq: Every day | ORAL | 0 refills | Status: DC
Start: 2017-05-29 — End: 2017-07-04

## 2017-05-29 MED ORDER — CYCLOBENZAPRINE HCL 10 MG PO TABS: 10.0000 mg | ORAL_TABLET | Freq: Three times a day (TID) | ORAL | 0 refills | Status: DC | PRN

## 2017-05-29 NOTE — Patient Instructions (Addendum)
Thank you for coming to the office today.   You most likely have a Kidney Stone (Nephrolithiasis)  It can cause flank or side pain, abdominal pain, or pain with urination. Also blood in urine is very common.  You have been referred to Urology  Re ordered Flomax keep taking one daily until seen by Urology  Trial on muscle relaxant - for pain may start Cyclobenzapine (Flexeril) 10mg  tablets (muscle relaxant) - start with half (cut) to one whole pill at night for muscle relaxant - may make you sedated or sleepy (be careful driving or working on this) if tolerated you can take half to whole tab 2 to 3 times daily or every 8 hours as needed  Take Zofran as needed  If pain not improving - may contact office for short term 5 day supply of Hydrocodone if need  Call [facility] to ask about Urine Culture result - find out if any bacteria causing UTI - share this info with Urology   [place] [address],  [place]  [address] Phone: [phone]  Very important to see Urologist as scheduled and discuss removal options and they may repeat imaging  If you get any of the following it is more of an emergency and may need to go to hospital directly or we can try to contact Urology sooner  - Fever, chills sweats nausea vomiting cannot tolerate antibiotic - Cannot pee or void at all for 12 hours straight, despite straining and medicine - Worsening kidney function Creatinine - based on lab test today  DUE for FASTING BLOOD WORK (no food or drink after midnight before the lab appointment, only water or coffee without cream/sugar on the morning of)  SCHEDULE "Lab Only" visit in the morning at the clinic for lab draw in 2 MONTHS   - Make sure Lab Only appointment is at about 1 week before your next appointment, so that results will be available  For Lab Results, once available within 2-3 days of blood draw,  you can can log in to MyChart online to view your results and a brief explanation. Also, we can discuss results at next follow-up visit.   Please schedule a Follow-up Appointment to: Return in about 2 months (around 07/29/2017) for Annual Physical.  If you have any other questions or concerns, please feel free to call the office or send a message through [facility]. You may also schedule an earlier appointment if necessary.  Additionally, you may be receiving a survey about your experience at our office within a few days to 1 week by e-mail or mail. We value your feedback.  Nobie Putnam, DO [facility]

## 2017-05-29 NOTE — Progress Notes (Signed)
Subjective:    Patient ID: Isaiah Doyle., male    DOB: 1970/06/29, 47 y.o.   MRN: [id number]  Isaiah Doyle. is a 47 y.o. male presenting on 05/29/2017 for Nephrolithiasis (has hx of kidney stone went to Urgent care with discomfort xray showed stone as per PA)  Previously followed by Dr Luan Pulling at our office here [facility] in 2017. Now re-establishing care, new provider here.  HPI   ED FOLLOW-UP VISIT  Hospital/Location: [facility] ([facility]) Date of UC Visit: 05/27/17  Reason for Presenting to UC: Flank/Groin Pain / Kidney Stone / Urinary Urgency Primary (+Secondary) Diagnosis: Nephrolithiasis, unspecified  FOLLOW-UP - ED provider note and record have been reviewed - Patient presents today about 2 days after recent ED visit. Brief summary of recent course:  About 4-6 weeks ago, he states recently he was traveling on road and he had to use bathroom with urge to urinate and then he waited and held it until got home, felt pain including groin. He thought kidney stone. He never passed stone. Now about 1 week ago he felt pain radiating down into groin pain again and into testicles on bilateral side, with urgent to urinate and noticed it worse walking around zoo. Worse on past Friday night 3 nights ago. They did X-ray at [facility] and saw some stones - Reportedly on the X-ray the radiologist called them "phleboliths" and the PA in the Urgent Care thought they were urinary stones - Recent factors he started back on tea, but he stopped this recently. Also was eating some mixed nuts - He tries to drink a lot of water to help - Still has urgency but less urine. Sometimes pain is intermittent now. He thinks pain is better but has not passed anything that he knows of. He tried lemon juice. - Taking Flomax 0.4mg  daily in AM - has 4 pills left - He finished Cipro antibiotic - Has Zofran incase he needs it - Denies any bulging in groin or hernia (he states this was checked at urgent  care) - Denies any active abdominal pain, nausea vomiting, diarrhea, fever chills, sweats  Past history of kidney stones back in December 2010, he was seen at [facility] had CT confirmed stones, given pain medicine and flomax, had strainer and passed all stones. - He had seen [facility] Dr Reece Agar in past but not recently, he would like to re-establish with Urologist to have further treatment - he has had intermittent episodes of kidney stones, this was one of more bothersome episodes since it has not resolved yet. He has tried lemon juice as well.  Additional history: - Works for [employer] in [place] from [place] to [facility], he is often traveling and driving, especially when he had recent symptoms - He has limited time now to treat this, since he has trip planned to leave on 5/23 for [place] to get married, would like to get it treated before he leaves  PMH - Type 2 Diabetes, was followed by Endocrinologist, lost a bunch of weight, he was on insulin and metformin then taken off of all meds. Last A1c 2017. Last seen Endo in 2012. He is due for future annual physical and labs and A1c   Depression screen [facility] 2/9 05/29/2017 03/09/2015  Decreased Interest 0 0  Down, Depressed, Hopeless 0 0  PHQ - 2 Score 0 0    Social History   Tobacco Use  . Smoking status: Former Research scientist (life sciences)  .  Smokeless tobacco: Former Network engineer Use Topics  . Alcohol use: Yes    Alcohol/week: 7.2 oz    Types: 12 Cans of beer per week  . Drug use: No    Review of Systems Per HPI unless specifically indicated above     Objective:    BP 139/86   Pulse 64   Temp 98.3 F (36.8 C) (Oral)   Resp 16   Ht 6\' 4"  (1.93 m)   Wt 256 lb 9.6 oz (116.4 kg)   BMI 31.23 kg/m   Wt Readings from Last 3 Encounters:  05/29/17 256 lb 9.6 oz (116.4 kg)  03/09/15 222 lb (100.7 kg)  01/05/15 210 lb (95.3 kg)    Physical Exam  Constitutional: He is oriented to person, place, and time. He  appears well-developed and well-nourished. No distress.  Well-appearing, comfortable, cooperative  HENT:  Head: Normocephalic and atraumatic.  Mouth/Throat: Oropharynx is clear and moist.  Eyes: Conjunctivae are normal. Right eye exhibits no discharge. Left eye exhibits no discharge.  Neck: Normal range of motion. Neck supple. No thyromegaly present.  Cardiovascular: Normal rate, regular rhythm, normal heart sounds and intact distal pulses.  No murmur heard. Pulmonary/Chest: Effort normal and breath sounds normal. No respiratory distress. He has no wheezes. He has no rales.  Abdominal: Soft. Bowel sounds are normal. He exhibits no distension and no mass. There is no tenderness. There is no rebound and no guarding.  Did not re-check hernia exam today, reported normal at urgent care. No active testicular or groin pain   Musculoskeletal: Normal range of motion. He exhibits no edema.  No provoked CVATenderness on back. No flank tenderness  Lymphadenopathy:    He has no cervical adenopathy.  Neurological: He is alert and oriented to person, place, and time.  Skin: Skin is warm and dry. No rash noted. He is not diaphoretic. No erythema.  Psychiatric: He has a normal mood and affect. His behavior is normal.  Well groomed, good eye contact, normal speech and thoughts  Nursing note and vitals reviewed.  Results for orders placed or performed in visit on 03/09/15  Comprehensive Metabolic Panel (CMET)  Result Value Ref Range   Glucose 114 (H) 65 - 99 mg/dL   BUN 19 6 - 24 mg/dL   Creatinine, Ser 0.81 0.76 - 1.27 mg/dL   GFR calc non Af Amer 107 >59 mL/min/1.73   GFR calc Af Amer 124 >59 mL/min/1.73   BUN/Creatinine Ratio 23 (H) 9 - 20   Sodium 142 134 - 144 mmol/L   Potassium 4.1 3.5 - 5.2 mmol/L   Chloride 102 96 - 106 mmol/L   CO2 22 18 - 29 mmol/L   Calcium 9.3 8.7 - 10.2 mg/dL   Total Protein 7.3 6.0 - 8.5 g/dL   Albumin 5.0 3.5 - 5.5 g/dL   Globulin, Total 2.3 1.5 - 4.5 g/dL    Albumin/Globulin Ratio 2.2 1.2 - 2.2   Bilirubin Total 0.8 0.0 - 1.2 mg/dL   Alkaline Phosphatase 69 39 - 117 IU/L   AST 27 0 - 40 IU/L   ALT 38 0 - 44 IU/L  Lipid Profile  Result Value Ref Range   Cholesterol, Total 174 100 - 199 mg/dL   Triglycerides 275 (H) 0 - 149 mg/dL   HDL 47 >39 mg/dL   VLDL Cholesterol Cal 55 (H) 5 - 40 mg/dL   LDL Calculated 72 0 - 99 mg/dL   Chol/HDL Ratio 3.7 0.0 - 5.0 ratio units  POCT HgB A1C  Result Value Ref Range   Hemoglobin A1C 5.5       Assessment & Plan:   Problem List Items Addressed This Visit    None    Visit Diagnoses    Nephrolithiasis    -  Primary   Relevant Medications   tamsulosin (FLOMAX) 0.4 MG CAPS capsule   cyclobenzaprine (FLEXERIL) 10 MG tablet   Other Relevant Orders   Ambulatory referral to Urology      Clinically most consistent with nephrolithiasis, possibly bilateral, difficult to determine localization today based on symptoms, does seem less severe and not as acute today, but has not passed stone, given extensive history on this before seems consistent. No sign of other infection or UTI or abdominal problem. No injury fall or trauma, less likely MSK, not reproduced pain. Has urinary urgency as well - Unable to review outside [facility] imaging x-ray since not available - there was discrepancy of phlebolith vs stones from provider/radiologist there  Plan Given deadline with need to treat stones and current episode before 5/23 when he will travel out of state for his wedding, agree that best to expedite course with referral to Urology since he has already been treated and it is not resolved - Referral to [facility] - re-establish he used to go there many years ago, may need advanced imaging possibly CT and consider alternative options for stone if he is unable to pass - Refilled Flomax 0.4mg  daily for up to 2+ weeks, definitely until he sees Urology, given 30 day supply - Rx trial on muscle relaxant Flexeril 5-10mg  TID PRN  prefer use at night or when resting, may help ureterocolick pain, since no acute pain now defer Hydrocodone, and tramadol is not helping - Use Zofran PRN - Improve hydration, strain urine - Already treated for empiric UTI recently, advised will not repeat course, he was asked to call Urgent Care to get urine culture results, as his UA was negative - Out of work today - Follow-up with Urology next - note already scheduled promptly for 06/01/17 w/ Dr Bernardo Heater at [facility] ordered this encounter  Medications  . tamsulosin (FLOMAX) 0.4 MG CAPS capsule    Sig: Take 1 capsule (0.4 mg total) by mouth daily after breakfast.    Dispense:  30 capsule    Refill:  0  . cyclobenzaprine (FLEXERIL) 10 MG tablet    Sig: Take 1 tablet (10 mg total) by mouth 3 (three) times daily as needed for muscle spasms (kidney stones).    Dispense:  30 tablet    Refill:  0   Orders Placed This Encounter  Procedures  . Ambulatory referral to Urology    Referral Priority:   Routine    Referral Type:   Consultation    Referral Reason:   Specialty Services Required    Requested Specialty:   Urology    Number of Visits Requested:   1    Follow up plan: Return in about 2 months (around 07/29/2017) for Annual Physical.  Will plan to order future fasting labs for 07/2017 after initial treatment at Urology this week.  Nobie Putnam, [facility] 05/29/2017, 1:30 PM

## 2017-06-01 ENCOUNTER — Encounter: Payer: Self-pay | Admitting: Urology

## 2017-06-01 ENCOUNTER — Ambulatory Visit: Payer: BLUE CROSS/BLUE SHIELD | Admitting: Urology

## 2017-06-01 VITALS — BP 144/90 | HR 77 | Resp 16 | Ht 76.0 in | Wt 254.5 lb

## 2017-06-01 DIAGNOSIS — Z87442 Personal history of urinary calculi: Secondary | ICD-10-CM | POA: Diagnosis not present

## 2017-06-01 DIAGNOSIS — R1030 Lower abdominal pain, unspecified: Secondary | ICD-10-CM | POA: Diagnosis not present

## 2017-06-01 DIAGNOSIS — N200 Calculus of kidney: Secondary | ICD-10-CM | POA: Diagnosis not present

## 2017-06-01 LAB — URINALYSIS, COMPLETE
Bilirubin, UA: NEGATIVE
Glucose, UA: NEGATIVE
Ketones, UA: NEGATIVE
Leukocytes, UA: NEGATIVE
Nitrite, UA: NEGATIVE
Protein, UA: NEGATIVE
Specific Gravity, UA: 1.025 (ref 1.005–1.030)
Urobilinogen, Ur: 0.2 mg/dL (ref 0.2–1.0)
pH, UA: 5.5 (ref 5.0–7.5)

## 2017-06-01 LAB — MICROSCOPIC EXAMINATION
Bacteria, UA: NONE SEEN
Epithelial Cells (non renal): NONE SEEN /hpf (ref 0–10)
WBC, UA: NONE SEEN /hpf (ref 0–5)

## 2017-06-01 MED ORDER — HYDROCODONE-ACETAMINOPHEN 5-325 MG PO TABS: 1.0000 | ORAL_TABLET | Freq: Four times a day (QID) | ORAL | 0 refills | Status: DC | PRN

## 2017-06-01 NOTE — Progress Notes (Signed)
06/01/2017 10:52 AM   Isaiah Doyle. [birth year] [id number]  Referring provider: Olin Hauser, DO [address] [place], [place] [address]  Chief complaint: Possible kidney stone  HPI: Isaiah Doyle is a 47 year old male seen in consultation at the request of Dr. Parks Ranger for evaluation of possible stone disease.  He presents with an approximately 2-week history of bilateral groin pain radiating to the scrotum and penis.  This is associated with urinary frequency, urgency, sensation of incomplete emptying and a burning sensation at the distal penis.  He was seen at a local urgent care and had a KUB which apparently showed pelvic calcifications felt to be phleboliths by the radiologic interpretation however the PA thought they were calculi.  He was started on tamsulosin and empiric antibiotics.  He has had occasional nausea.  He denies fever or chills.  He does have a prior history of stone disease.  He has been taking tramadol for the pain which he states has not provided relief.  He states his current symptoms are similar to prior stone symptoms.  He is getting married and going to [place] at the end of this month.   PMH: Past Medical History:  Diagnosis Date  . Allergy   . Chronic kidney disease    kidney stones  . Diabetes mellitus without complication ([place])   . GERD (gastroesophageal reflux disease)     Surgical History: Past Surgical History:  Procedure Laterality Date  . COLONOSCOPY    . ESOPHAGOGASTRODUODENOSCOPY (EGD) WITH PROPOFOL N/A 11/13/2014   Procedure: ESOPHAGOGASTRODUODENOSCOPY (EGD) WITH PROPOFOL;  Surgeon: Hulen Luster, MD;  Location: [facility] END[facility]COPY;  Service: Gastroenterology;  Laterality: N/A;  . TONSILLECTOMY    . WRIST MASS EXCISION      Home Medications:  Allergies as of 06/01/2017      Reactions   Erythromycin Nausea And Vomiting      Medication List        Accurate as of 06/01/17 10:52 AM. Always use your most recent med list.          acetaminophen 500 MG tablet Commonly known as:  TYLENOL Take 500 mg by mouth every 6 (six) hours as needed.   cetirizine 10 MG tablet Commonly known as:  ZYRTEC Take 10 mg by mouth as needed for allergies.   cyclobenzaprine 10 MG tablet Commonly known as:  FLEXERIL Take 1 tablet (10 mg total) by mouth 3 (three) times daily as needed for muscle spasms (kidney stones).   ibuprofen 200 MG tablet Commonly known as:  ADVIL,MOTRIN Take 200 mg by mouth every 6 (six) hours as needed.   multivitamin tablet Take 1 tablet by mouth daily.   omeprazole 20 MG capsule Commonly known as:  PRILOSEC Take 1 capsule (20 mg total) by mouth daily.   tamsulosin 0.4 MG Caps capsule Commonly known as:  FLOMAX Take 1 capsule (0.4 mg total) by mouth daily after breakfast.       Allergies:  Allergies  Allergen Reactions  . Erythromycin Nausea And Vomiting    Family History: Family History  Problem Relation Age of Onset  . Cancer Mother        BREAST  . Heart attack Father   . Heart disease Father     Social History:  reports that he has quit smoking. He has quit using smokeless tobacco. He reports that he drinks about 7.2 oz of alcohol per week. He reports that he does not use drugs.  ROS: UROLOGY Frequent Urination?: Yes  Hard to postpone urination?: No Burning/pain with urination?: Yes Leakage of urine?: Yes Urine stream starts and stops?: No Trouble starting stream?: No Do you have to strain to urinate?: Yes Blood in urine?: No Urinary tract infection?: No Sexually transmitted disease?: No Injury to kidneys or bladder?: No Painful intercourse?: No Weak stream?: Yes Erection problems?: No Penile pain?: Yes  Gastrointestinal Nausea?: Yes Vomiting?: No Indigestion/heartburn?: No Diarrhea?: No Constipation?: No  Constitutional Fever: No Night sweats?: No Weight loss?: No Fatigue?: No  Skin Skin rash/lesions?: No Itching?: No  Eyes Blurred vision?: No Double  vision?: No  Ears/Nose/Throat Sore throat?: No Sinus problems?: No  Hematologic/Lymphatic Swollen glands?: No Easy bruising?: No  Cardiovascular Leg swelling?: No Chest pain?: No  Respiratory Cough?: No Shortness of breath?: No  Endocrine Excessive thirst?: No  Musculoskeletal Back pain?: No Joint pain?: No  Neurological Headaches?: No Dizziness?: No  Psychologic Depression?: No Anxiety?: No  Physical Exam: BP (!) 144/90   Pulse 77   Resp 16   Ht 6\' 4"  (1.93 m)   Wt 254 lb 8 oz (115.4 kg)   SpO2 98%   BMI 30.98 kg/m   Constitutional:  Alert and oriented, No acute distress. HEENT: [place] AT, moist mucus membranes.  Trachea midline, no masses. Cardiovascular: No clubbing, cyanosis, or edema.  RRR Respiratory: Normal respiratory effort, no increased work of breathing.  Clear GI: Abdomen is soft, nontender, nondistended, no abdominal masses GU: No CVA tenderness.  Penis without lesions, testes descended bilaterally without masses or tenderness, no paratesticular abnormalities. Lymph: No cervical or inguinal lymphadenopathy. Skin: No rashes, bruises or suspicious lesions. Neurologic: Grossly intact, no focal deficits, moving all 4 extremities. Psychiatric: Normal mood and affect.  Laboratory Data:  Urinalysis Dipstick 2+ blood; micro 11-30 RBC  Pertinent Imaging: N/A  Assessment & Plan:   47 year old male with pelvic pain and irritative voiding symptoms with microhematuria.  He has a prior history of stone disease and may have a distal ureteral calculus accounting for his symptoms.  A stone protocol CT of the abdomen and pelvis was ordered and he will be notified with the results.  Continue tamsulosin.  Rx hydrocodone was sent to his pharmacy.    Abbie Sons, [place] [address], [place], [place] [address] [phone]

## 2017-06-03 ENCOUNTER — Encounter: Payer: Self-pay | Admitting: Urology

## 2017-06-04 LAB — CULTURE, URINE COMPREHENSIVE

## 2017-06-07 ENCOUNTER — Other Ambulatory Visit: Payer: Self-pay | Admitting: Family Medicine

## 2017-06-07 ENCOUNTER — Encounter: Payer: Self-pay | Admitting: Family Medicine

## 2017-06-07 DIAGNOSIS — Z0000 Encounter for general adult medical examination without abnormal findings: Secondary | ICD-10-CM

## 2017-06-07 DIAGNOSIS — E1169 Type 2 diabetes mellitus with other specified complication: Secondary | ICD-10-CM

## 2017-06-07 DIAGNOSIS — E119 Type 2 diabetes mellitus without complications: Secondary | ICD-10-CM

## 2017-06-07 DIAGNOSIS — K219 Gastro-esophageal reflux disease without esophagitis: Secondary | ICD-10-CM

## 2017-06-07 DIAGNOSIS — E785 Hyperlipidemia, unspecified: Secondary | ICD-10-CM

## 2017-06-09 ENCOUNTER — Ambulatory Visit
Admission: RE | Admit: 2017-06-09 | Discharge: 2017-06-09 | Disposition: A | Discharge status: Home / Self Care | Payer: BLUE CROSS/BLUE SHIELD | Source: Ambulatory Visit | Attending: Urology | Admitting: Urology

## 2017-06-09 DIAGNOSIS — N200 Calculus of kidney: Secondary | ICD-10-CM | POA: Insufficient documentation

## 2017-06-09 DIAGNOSIS — R1030 Lower abdominal pain, unspecified: Secondary | ICD-10-CM | POA: Insufficient documentation

## 2017-06-12 ENCOUNTER — Other Ambulatory Visit: Payer: Self-pay | Admitting: Urology

## 2017-06-12 NOTE — Telephone Encounter (Signed)
Patient states that he saw his CT results on MY chart and wants more pain meds until he gets back from vacation.  He said he is leaving in two days. Please advise  Sharyn Lull

## 2017-06-13 MED ORDER — HYDROCODONE-ACETAMINOPHEN 5-325 MG PO TABS: 1.0000 | ORAL_TABLET | Freq: Four times a day (QID) | ORAL | 0 refills | Status: DC | PRN

## 2017-06-13 NOTE — Telephone Encounter (Signed)
The calculi noted on CT are nonobstructing and unlikely causing his groin/lower abdominal pain.  A limited refill of pain medication was sent.

## 2017-06-13 NOTE — Telephone Encounter (Signed)
Called pt informed him of the information below. Pt gave verbal understanding and states that he is going to be on an airplane for 12 hours and didn't want to risk being in pain.

## 2017-06-20 ENCOUNTER — Ambulatory Visit: Payer: BLUE CROSS/BLUE SHIELD | Admitting: Urology

## 2017-06-30 ENCOUNTER — Other Ambulatory Visit: Payer: Self-pay | Admitting: Family Medicine

## 2017-06-30 DIAGNOSIS — N200 Calculus of kidney: Secondary | ICD-10-CM

## 2017-07-04 ENCOUNTER — Other Ambulatory Visit: Payer: Self-pay | Admitting: Family Medicine

## 2017-07-04 DIAGNOSIS — N200 Calculus of kidney: Secondary | ICD-10-CM

## 2017-07-04 NOTE — Telephone Encounter (Signed)
Please call patient and ask if he is requesting refill of Tamsulosin (Flomax). This was previously rx to him for Kidney Stone, and he has seen Urologist. I have declined last refill.  If he needs to keep taking it for kidney stone I can refill, but he should follow-up with Urologist as planned.  If he does not need it, and this is automatic refill, he should be able to discontinue this through pharmacy.  Nobie Putnam, [facility] 07/04/2017, 7:37 PM

## 2017-07-07 NOTE — Telephone Encounter (Signed)
Spoke with patient, confirmed he has not passed either of the 2 kidney stones identified on last CT 05/2017, he thinks one is potentially passing, he needed refill of Flomax for this reason, refilled sent to pharmacy added +2 refills only take for kidney stone, if not needing then do not need to take it in future.  Nobie Putnam, [facility] 07/07/2017, 4:52 PM

## 2017-08-07 DIAGNOSIS — M542 Cervicalgia: Secondary | ICD-10-CM | POA: Diagnosis not present

## 2017-08-07 DIAGNOSIS — M9901 Segmental and somatic dysfunction of cervical region: Secondary | ICD-10-CM | POA: Diagnosis not present

## 2017-08-07 DIAGNOSIS — M9903 Segmental and somatic dysfunction of lumbar region: Secondary | ICD-10-CM | POA: Diagnosis not present

## 2017-08-07 DIAGNOSIS — M545 Low back pain: Secondary | ICD-10-CM | POA: Diagnosis not present

## 2017-08-14 DIAGNOSIS — M542 Cervicalgia: Secondary | ICD-10-CM | POA: Diagnosis not present

## 2017-08-14 DIAGNOSIS — M545 Low back pain: Secondary | ICD-10-CM | POA: Diagnosis not present

## 2017-08-14 DIAGNOSIS — M9903 Segmental and somatic dysfunction of lumbar region: Secondary | ICD-10-CM | POA: Diagnosis not present

## 2017-08-14 DIAGNOSIS — M9901 Segmental and somatic dysfunction of cervical region: Secondary | ICD-10-CM | POA: Diagnosis not present

## 2017-08-30 ENCOUNTER — Other Ambulatory Visit: Payer: BLUE CROSS/BLUE SHIELD

## 2017-08-30 DIAGNOSIS — Z0000 Encounter for general adult medical examination without abnormal findings: Secondary | ICD-10-CM | POA: Diagnosis not present

## 2017-08-30 DIAGNOSIS — M542 Cervicalgia: Secondary | ICD-10-CM | POA: Diagnosis not present

## 2017-08-30 DIAGNOSIS — E119 Type 2 diabetes mellitus without complications: Secondary | ICD-10-CM

## 2017-08-30 DIAGNOSIS — E785 Hyperlipidemia, unspecified: Secondary | ICD-10-CM

## 2017-08-30 DIAGNOSIS — M9901 Segmental and somatic dysfunction of cervical region: Secondary | ICD-10-CM | POA: Diagnosis not present

## 2017-08-30 DIAGNOSIS — M9903 Segmental and somatic dysfunction of lumbar region: Secondary | ICD-10-CM | POA: Diagnosis not present

## 2017-08-30 DIAGNOSIS — E1169 Type 2 diabetes mellitus with other specified complication: Secondary | ICD-10-CM

## 2017-08-30 DIAGNOSIS — M545 Low back pain: Secondary | ICD-10-CM | POA: Diagnosis not present

## 2017-08-31 LAB — CBC WITH DIFFERENTIAL/PLATELET
Basophils Absolute: 59 cells/uL (ref 0–200)
Basophils Relative: 0.9 %
Eosinophils Absolute: 158 cells/uL (ref 15–500)
Eosinophils Relative: 2.4 %
HCT: 43.1 % (ref 38.5–50.0)
Hemoglobin: 14.7 g/dL (ref 13.2–17.1)
Lymphs Abs: 2112 cells/uL (ref 850–3900)
MCH: 30.9 pg (ref 27.0–33.0)
MCHC: 34.1 g/dL (ref 32.0–36.0)
MCV: 90.5 fL (ref 80.0–100.0)
MPV: 10.8 fL (ref 7.5–12.5)
Monocytes Relative: 10.5 %
Neutro Abs: 3577 cells/uL (ref 1500–7800)
Neutrophils Relative %: 54.2 %
Platelets: 156 10*3/uL (ref 140–400)
RBC: 4.76 10*6/uL (ref 4.20–5.80)
RDW: 12.2 % (ref 11.0–15.0)
Total Lymphocyte: 32 %
WBC mixed population: 693 cells/uL (ref 200–950)
WBC: 6.6 10*3/uL (ref 3.8–10.8)

## 2017-08-31 LAB — COMPLETE METABOLIC PANEL WITH GFR
AG Ratio: 1.6 (calc) (ref 1.0–2.5)
ALT: 269 U/L — ABNORMAL HIGH (ref 9–46)
AST: 153 U/L — ABNORMAL HIGH (ref 10–40)
Albumin: 4.5 g/dL (ref 3.6–5.1)
Alkaline phosphatase (APISO): 86 U/L (ref 40–115)
BUN: 12 mg/dL (ref 7–25)
CO2: 25 mmol/L (ref 20–32)
Calcium: 9.4 mg/dL (ref 8.6–10.3)
Chloride: 105 mmol/L (ref 98–110)
Creat: 1.04 mg/dL (ref 0.60–1.35)
GFR, Est African American: 98 mL/min/{1.73_m2} (ref >=60)
GFR, Est Non African American: 85 mL/min/{1.73_m2} (ref >=60)
Globulin: 2.8 g/dL (calc) (ref 1.9–3.7)
Glucose, Bld: 141 mg/dL — ABNORMAL HIGH (ref 65–99)
Potassium: 4.8 mmol/L (ref 3.5–5.3)
Sodium: 140 mmol/L (ref 135–146)
Total Bilirubin: 0.7 mg/dL (ref 0.2–1.2)
Total Protein: 7.3 g/dL (ref 6.1–8.1)

## 2017-08-31 LAB — LIPID PANEL
Cholesterol: 238 mg/dL — ABNORMAL HIGH (ref <200)
HDL: 51 mg/dL (ref >40)
LDL Cholesterol (Calc): 161 mg/dL (calc) — ABNORMAL HIGH
Non-HDL Cholesterol (Calc): 187 mg/dL (calc) — ABNORMAL HIGH (ref <130)
Total CHOL/HDL Ratio: 4.7 (calc) (ref <5.0)
Triglycerides: 137 mg/dL (ref <150)

## 2017-08-31 LAB — HEMOGLOBIN A1C
Hgb A1c MFr Bld: 6.8 % of total Hgb — ABNORMAL HIGH (ref <5.7)
Mean Plasma Glucose: 148 (calc)
eAG (mmol/L): 8.2 (calc)

## 2017-09-03 ENCOUNTER — Encounter: Payer: Self-pay | Admitting: Family Medicine

## 2017-09-03 DIAGNOSIS — R7989 Other specified abnormal findings of blood chemistry: Secondary | ICD-10-CM | POA: Insufficient documentation

## 2017-09-03 DIAGNOSIS — R945 Abnormal results of liver function studies: Secondary | ICD-10-CM

## 2017-09-04 DIAGNOSIS — M9901 Segmental and somatic dysfunction of cervical region: Secondary | ICD-10-CM | POA: Diagnosis not present

## 2017-09-04 DIAGNOSIS — M542 Cervicalgia: Secondary | ICD-10-CM | POA: Diagnosis not present

## 2017-09-04 DIAGNOSIS — M545 Low back pain: Secondary | ICD-10-CM | POA: Diagnosis not present

## 2017-09-04 DIAGNOSIS — M5413 Radiculopathy, cervicothoracic region: Secondary | ICD-10-CM | POA: Diagnosis not present

## 2017-09-05 ENCOUNTER — Encounter: Payer: Self-pay | Admitting: Family Medicine

## 2017-09-05 ENCOUNTER — Encounter: Payer: BLUE CROSS/BLUE SHIELD | Admitting: Family Medicine

## 2017-09-05 ENCOUNTER — Ambulatory Visit (INDEPENDENT_AMBULATORY_CARE_PROVIDER_SITE_OTHER): Payer: BLUE CROSS/BLUE SHIELD | Admitting: Family Medicine

## 2017-09-05 VITALS — BP 126/90 | HR 73 | Temp 97.9°F | Resp 16 | Ht 76.0 in | Wt 255.6 lb

## 2017-09-05 DIAGNOSIS — E1169 Type 2 diabetes mellitus with other specified complication: Secondary | ICD-10-CM | POA: Diagnosis not present

## 2017-09-05 DIAGNOSIS — Z0000 Encounter for general adult medical examination without abnormal findings: Secondary | ICD-10-CM | POA: Diagnosis not present

## 2017-09-05 DIAGNOSIS — R945 Abnormal results of liver function studies: Secondary | ICD-10-CM

## 2017-09-05 DIAGNOSIS — R7989 Other specified abnormal findings of blood chemistry: Secondary | ICD-10-CM

## 2017-09-05 DIAGNOSIS — E785 Hyperlipidemia, unspecified: Secondary | ICD-10-CM

## 2017-09-05 DIAGNOSIS — Z23 Encounter for immunization: Secondary | ICD-10-CM | POA: Diagnosis not present

## 2017-09-05 DIAGNOSIS — K219 Gastro-esophageal reflux disease without esophagitis: Secondary | ICD-10-CM | POA: Diagnosis not present

## 2017-09-05 LAB — POCT UA - MICROALBUMIN: Microalbumin Ur, POC: 20 mg/L

## 2017-09-05 MED ORDER — METFORMIN HCL ER 500 MG PO TB24: 500.0000 mg | ORAL_TABLET | Freq: Every day | ORAL | 5 refills | Status: DC

## 2017-09-05 MED ORDER — OMEPRAZOLE 20 MG PO CPDR: 20.0000 mg | DELAYED_RELEASE_CAPSULE | Freq: Every day | ORAL | 5 refills | Status: DC

## 2017-09-05 NOTE — Patient Instructions (Addendum)
Thank you for coming to the office today.  Refilled Omeprazole 20mg  daily before breakfast - can try taking every day for period of time, vs every other day - see what works best, notify us if we need to change. May have flare up of stomach acid if stop this cold [place] - rebound symptoms  Elevated Liver Enzymes as discussed - likely from combination of weight, cholesterol and diabetes  Concern with elevated cholesterol - likely affecting liver. As discussed, future risk of cardiovascular complication is mildly elevated but below threshold for requiring Statin medication. However, all diabetics will benefit from these medicines - we can pause for now and re-check liver tests in future after lifestyle change then reconsider.  A1c 6.8 - still theoretically controlled as a diabetic, but notably higher than previous results.  Start back on Metformin XR - 500mg  once daily in AM with food. 24 hour coverage.  In future we can adjust meds or consider new med.  Your provider would like to you have your annual eye exam. Please contact your current eye doctor or here are some good options for you to contact.    [facility] [address] [place], [place] [address] Phone: [phone] [email or web address]  --------------------------------------------------------------------  Read about and consider calling insurance find cost and coverage of the following  1. Ozempic (Semaglutide injection) - start 0.25mg  weekly for 4 weeks then increase to 0.5mg  weekly - This one has best benefit of weight loss and reducing Cardiovascular events  2. Bydureon BCise (Exenatide ER) - once weekly - this is my preference, very good medicine well tolerated, less side effects of nausea, upset stomach. No dose changes. Cost and coverage is the problem, but we may be able to get it with the coupon card  3. Trulicity (Dulaglutide) - once weekly - this is very good one, usually one of my top choices as well,  two doses, 0.75 (likely we would start) and 1.5 max dose. We can use coupon card here too  4. Victoza (Liraglutide) - once DAILY - 3 dose changes 0.6, 1.2 and 1.8, side effects nausea, upset stomach higher on this one but it is still very effective medicine  DUE for FASTING BLOOD WORK (no food or drink after midnight before the lab appointment, only water or coffee without cream/sugar on the morning of)  SCHEDULE "Lab Only" visit in the morning at the clinic for lab draw in 6 MONTHS   - Make sure Lab Only appointment is at about 1 week before your next appointment, so that results will be available  For Lab Results, once available within 2-3 days of blood draw, you can can log in to MyChart online to view your results and a brief explanation. Also, we can discuss results at next follow-up visit.   Please schedule a Follow-up Appointment to: Return in about 6 months (around 03/08/2018) for Lab follow-up DM, HLD, LFTs, Weight - discuss med.  If you have any other questions or concerns, please feel free to call the office or send a message through [facility]. You may also schedule an earlier appointment if necessary.  Additionally, you may be receiving a survey about your experience at our office within a few days to 1 week by e-mail or mail. We value your feedback.  Nobie Putnam, DO [place]

## 2017-09-05 NOTE — Progress Notes (Signed)
Subjective:    Patient ID: Isaiah Hensen., male    DOB: 1970-10-13, 47 y.o.   MRN: [id number]  Isaiah Mamone. is a 47 y.o. male presenting on 09/05/2017 for Annual Exam   HPI   Here for Annual Physical and Lab Review  History of Kidney Stone - Followed by [facility], see prior note for information, Isaiah Doyle was treated in past with flomax, imaging, and pain medication PRN for passing stone. Isaiah Doyle passed one stone and reportedly had 2 more still present. - Isaiah Doyle admits that flomax changed his urinary stream seemed to void better while on it, than compared to when off, but Isaiah Doyle doesn't endorse significant prostate LUTS otherwise  LFTs Elevated History  back in 2011-2012, Isaiah Doyle had similar elevated when Isaiah Doyle was dx with T2DM, followed by Endo Dr Gabriel Carina - lost a bunch of weight, concern with wt loss and discharged patient - his prior diet was diet during week and then weekends ate and drank what Isaiah Doyle wanted, Isaiah Doyle got off this diet and gained wt had problem, thinks this is related - Now LFTs again abnormal previously normal within past 1-2 years   CHRONIC DM, Type 2: Reports issue with uncontrolled DM in past, see prior note, Isaiah Doyle used to be on insulin at first and metformin temporarily then eventually taken off all meds after diet changes. No longer followed by [facility] Not checking CBG regularly Meds: None currently Currently not on ACE/ARB Lifestyle: - Diet (admits plan to improve low carb diet again and fasting during week)  - Exercise (will resume regimen) Due for DM eye visit - will return to [facility] Denies hypoglycemia, polyuria, numbness or tingling  GERD Chronic history, has been controlled on PPI. Was taking intermittently in past, omeprazole 20mg  OTC, request rx  HYPERLIPIDEMIA: - Reports concerns. Last lipid panel 08/2017, elevated LDL - Not on cholesterol medicine. No prior statin, was offered in past atorvastatin Isaiah Doyle did not take Father high cholesterol and heart disease - 66  age  Health Maintenance:  Due Tdap - will get today.  Due for Flu vaccine - not in stock currently will return  Due for pneumonia vaccine as diabetic patient < age 26, need 1st dose Pneumovax-23, will return for this in future.  Depression screen [facility] 2/9 09/05/2017 05/29/2017 03/09/2015  Decreased Interest 0 0 0  Down, Depressed, Hopeless 0 0 0  PHQ - 2 Score 0 0 0    Past Medical History:  Diagnosis Date  . Allergy   . GERD (gastroesophageal reflux disease)    Past Surgical History:  Procedure Laterality Date  . COLONOSCOPY    . ESOPHAGOGASTRODUODENOSCOPY (EGD) WITH PROPOFOL N/A 11/13/2014   Procedure: ESOPHAGOGASTRODUODENOSCOPY (EGD) WITH PROPOFOL;  Surgeon: Hulen Luster, MD;  Location: [facility] ENDOSCOPY;  Service: Gastroenterology;  Laterality: N/A;  . TONSILLECTOMY    . WRIST MASS EXCISION     Social History   Socioeconomic History  . Marital status: Married    Spouse name: Not on file  . Number of children: Not on file  . Years of education: Not on file  . Highest education level: Not on file  Occupational History  . Not on file  Social Needs  . Financial resource strain: Not on file  . Food insecurity:    Worry: Not on file    Inability: Not on file  . Transportation needs:    Medical: Not on file    Non-medical: Not on file  Tobacco Use  .  Smoking status: Former Research scientist (life sciences)  . Smokeless tobacco: Former Network engineer and Sexual Activity  . Alcohol use: Yes    Alcohol/week: 12.0 standard drinks    Types: 12 Cans of beer per week  . Drug use: No  . Sexual activity: Not on file  Lifestyle  . Physical activity:    Days per week: Not on file    Minutes per session: Not on file  . Stress: Not on file  Relationships  . Social connections:    Talks on phone: Not on file    Gets together: Not on file    Attends religious service: Not on file    Active member of club or organization: Not on file    Attends meetings of clubs or organizations: Not on file     Relationship status: Not on file  . Intimate partner violence:    Fear of current or ex partner: Not on file    Emotionally abused: Not on file    Physically abused: Not on file    Forced sexual activity: Not on file  Other Topics Concern  . Not on file  Social History Narrative  . Not on file   Family History  Problem Relation Age of Onset  . Cancer Mother        BREAST  . Heart attack Father 62  . Heart disease Father 69   Current Outpatient Medications on File Prior to Visit  Medication Sig  . cetirizine (ZYRTEC) 10 MG tablet Take 10 mg by mouth as needed for allergies.  . Multiple Vitamin (MULTIVITAMIN) tablet Take 1 tablet by mouth daily.  . tamsulosin (FLOMAX) 0.4 MG CAPS capsule Take 1 capsule (0.4 mg total) by mouth daily after breakfast. Only take for passing kidney stone  . acetaminophen (TYLENOL) 500 MG tablet Take 500 mg by mouth every 6 (six) hours as needed.  . cyclobenzaprine (FLEXERIL) 10 MG tablet Take 1 tablet (10 mg total) by mouth 3 (three) times daily as needed for muscle spasms (kidney stones). (Patient not taking: Reported on 09/05/2017)  . HYDROcodone-acetaminophen (NORCO/VICODIN) 5-325 MG tablet Take 1-2 tablets by mouth every 6 (six) hours as needed for moderate pain. (Patient not taking: Reported on 09/05/2017)  . ibuprofen (ADVIL,MOTRIN) 200 MG tablet Take 200 mg by mouth every 6 (six) hours as needed.   No current facility-administered medications on file prior to visit.     Review of Systems  Constitutional: Negative for activity change, appetite change, chills, diaphoresis, fatigue and fever.  HENT: Negative for congestion and hearing loss.   Eyes: Negative for visual disturbance.  Respiratory: Negative for apnea, cough, choking, chest tightness, shortness of breath and wheezing.   Cardiovascular: Negative for chest pain, palpitations and leg swelling.  Gastrointestinal: Negative for abdominal pain, anal bleeding, blood in stool, constipation,  diarrhea, nausea and vomiting.  Endocrine: Negative for cold intolerance.  Genitourinary: Negative for difficulty urinating (No difficulty - but reports when Isaiah Doyle was taking Flomax for kidney stone,Isaiah Doyle voided better - no history of BPH), dysuria, frequency and hematuria.  Musculoskeletal: Negative for arthralgias, back pain and neck pain.  Skin: Negative for rash.  Allergic/Immunologic: Negative for environmental allergies.  Neurological: Negative for dizziness, weakness, light-headedness, numbness and headaches.  Hematological: Negative for adenopathy.  Psychiatric/Behavioral: Negative for behavioral problems, dysphoric mood and sleep disturbance. The patient is not nervous/anxious.    Per HPI unless specifically indicated above     Objective:    BP 126/90   Pulse 73  Temp 97.9 F (36.6 C) (Oral)   Resp 16   Ht 6\' 4"  (1.93 m)   Wt 255 lb 9.6 oz (115.9 kg)   BMI 31.11 kg/m   Wt Readings from Last 3 Encounters:  09/05/17 255 lb 9.6 oz (115.9 kg)  06/01/17 254 lb 8 oz (115.4 kg)  05/29/17 256 lb 9.6 oz (116.4 kg)    Physical Exam  Constitutional: Isaiah Doyle is oriented to person, place, and time. Isaiah Doyle appears well-developed and well-nourished. No distress.  Well-appearing, comfortable, cooperative  HENT:  Head: Normocephalic and atraumatic.  Mouth/Throat: Oropharynx is clear and moist.  Frontal / maxillary sinuses non-tender. Nares patent without purulence or edema. Bilateral TMs clear without erythema, effusion or bulging. Oropharynx clear without erythema, exudates, edema or asymmetry.  Eyes: Pupils are equal, round, and reactive to light. Conjunctivae and EOM are normal. Right eye exhibits no discharge. Left eye exhibits no discharge.  Neck: Normal range of motion. Neck supple. No thyromegaly present.  Cardiovascular: Normal rate, regular rhythm, normal heart sounds and intact distal pulses.  No murmur heard. Pulmonary/Chest: Effort normal and breath sounds normal. No respiratory  distress. Isaiah Doyle has no wheezes. Isaiah Doyle has no rales.  Abdominal: Soft. Bowel sounds are normal. Isaiah Doyle exhibits no distension and no mass. There is no tenderness.  Musculoskeletal: Normal range of motion. Isaiah Doyle exhibits no edema or tenderness.  Upper / Lower Extremities: - Normal muscle tone, strength bilateral upper extremities 5/5, lower extremities 5/5  Lymphadenopathy:    Isaiah Doyle has no cervical adenopathy.  Neurological: Isaiah Doyle is alert and oriented to person, place, and time.  Distal sensation intact to light touch all extremities  Skin: Skin is warm and dry. No rash noted. Isaiah Doyle is not diaphoretic. No erythema.  Psychiatric: Isaiah Doyle has a normal mood and affect. His behavior is normal.  Well groomed, good eye contact, normal speech and thoughts  Nursing note and vitals reviewed.   Diabetic Foot Exam - Simple   Simple Foot Form Diabetic Foot exam was performed with the following findings:  Yes 09/05/2017  4:39 PM  Visual Inspection No deformities, no ulcerations, no other skin breakdown bilaterally:  Yes Sensation Testing Intact to touch and monofilament testing bilaterally:  Yes Pulse Check Posterior Tibialis and Dorsalis pulse intact bilaterally:  Yes Comments    Recent Labs    08/30/17 0833  HGBA1C 6.8*    Recent Results (from the past 2160 hour(s))  Lipid panel     Status: Abnormal   Collection Time: 08/30/17  8:33 AM  Result Value Ref Range   Cholesterol 238 (H) <200 mg/dL   HDL 51 >40 mg/dL   Triglycerides 137 <150 mg/dL   LDL Cholesterol (Calc) 161 (H) mg/dL (calc)    Comment: Reference range: <100 . Desirable range <100 mg/dL for primary prevention;   <70 mg/dL for patients with CHD or diabetic patients  with > or = 2 CHD risk factors. Marland Kitchen LDL-C is now calculated using the Martin-Hopkins  calculation, which is a validated novel method providing  better accuracy than the Friedewald equation in the  estimation of LDL-C.  Cresenciano Genre et al. Annamaria Helling. 8315;176(16): 2061-2068   (http://education.[facility].com/faq/FAQ164)    Total CHOL/HDL Ratio 4.7 <5.0 (calc)   Non-HDL Cholesterol (Calc) 187 (H) <130 mg/dL (calc)    Comment: For patients with diabetes plus 1 major ASCVD risk  factor, treating to a non-HDL-C goal of <100 mg/dL  (LDL-C of <70 mg/dL) is considered a therapeutic  option.   COMPLETE METABOLIC PANEL WITH GFR  Status: Abnormal   Collection Time: 08/30/17  8:33 AM  Result Value Ref Range   Glucose, Bld 141 (H) 65 - 99 mg/dL    Comment: .            Fasting reference interval . For someone without known diabetes, a glucose value >125 mg/dL indicates that they may have diabetes and this should be confirmed with a follow-up test. .    BUN 12 7 - 25 mg/dL   Creat 1.04 0.60 - 1.35 mg/dL   GFR, Est Non African American 85 > OR = 60 mL/min/1.60m2   GFR, Est African American 98 > OR = 60 mL/min/1.85m2   BUN/Creatinine Ratio NOT APPLICABLE 6 - 22 (calc)   Sodium 140 135 - 146 mmol/L   Potassium 4.8 3.5 - 5.3 mmol/L   Chloride 105 98 - 110 mmol/L   CO2 25 20 - 32 mmol/L   Calcium 9.4 8.6 - 10.3 mg/dL   Total Protein 7.3 6.1 - 8.1 g/dL   Albumin 4.5 3.6 - 5.1 g/dL   Globulin 2.8 1.9 - 3.7 g/dL (calc)   AG Ratio 1.6 1.0 - 2.5 (calc)   Total Bilirubin 0.7 0.2 - 1.2 mg/dL   Alkaline phosphatase (APISO) 86 40 - 115 U/L   AST 153 (H) 10 - 40 U/L   ALT 269 (H) 9 - 46 U/L  CBC with Differential/Platelet     Status: None   Collection Time: 08/30/17  8:33 AM  Result Value Ref Range   WBC 6.6 3.8 - 10.8 Thousand/uL   RBC 4.76 4.20 - 5.80 Million/uL   Hemoglobin 14.7 13.2 - 17.1 g/dL   HCT 43.1 38.5 - 50.0 %   MCV 90.5 80.0 - 100.0 fL   MCH 30.9 27.0 - 33.0 pg   MCHC 34.1 32.0 - 36.0 g/dL   RDW 12.2 11.0 - 15.0 %   Platelets 156 140 - 400 Thousand/uL   MPV 10.8 7.5 - 12.5 fL   Neutro Abs 3,577 1,500 - 7,800 cells/uL   Lymphs Abs 2,112 850 - 3,900 cells/uL   WBC mixed population 693 200 - 950 cells/uL   Eosinophils Absolute 158 15 -  500 cells/uL   Basophils Absolute 59 0 - 200 cells/uL   Neutrophils Relative % 54.2 %   Total Lymphocyte 32.0 %   Monocytes Relative 10.5 %   Eosinophils Relative 2.4 %   Basophils Relative 0.9 %  Hemoglobin A1c     Status: Abnormal   Collection Time: 08/30/17  8:33 AM  Result Value Ref Range   Hgb A1c MFr Bld 6.8 (H) <5.7 % of total Hgb    Comment: For someone without known diabetes, a hemoglobin A1c value of 6.5% or greater indicates that they may have  diabetes and this should be confirmed with a follow-up  test. . For someone with known diabetes, a value <7% indicates  that their diabetes is well controlled and a value  greater than or equal to 7% indicates suboptimal  control. A1c targets should be individualized based on  duration of diabetes, age, comorbid conditions, and  other considerations. . Currently, no consensus exists regarding use of hemoglobin A1c for diagnosis of diabetes for children. .    Mean Plasma Glucose 148 (calc)   eAG (mmol/L) 8.2 (calc)  POCT UA - Microalbumin     Status: Abnormal   Collection Time: 09/05/17  5:06 PM  Result Value Ref Range   Microalbumin Ur, POC 20 mg/L  Results for orders placed or performed in visit on 09/05/17  POCT UA - Microalbumin  Result Value Ref Range   Microalbumin Ur, POC 20 mg/L      Assessment & Plan:   Problem List Items Addressed This Visit    Elevated LFTs    Likely secondary hepatic steatosis With elevated LDL, DM and wt gain Prior similar history Will re-check CMET in 6 months after lifestyle change - then reconsider statin      GERD (gastroesophageal reflux disease)    Stable, without complication Will send rx omeprazole PPI daily can taper down in future      Relevant Medications   omeprazole (PRILOSEC) 20 MG capsule   Hyperlipidemia associated with type 2 diabetes mellitus ([place])    Uncontrolled cholesterol poor lifestyle - improving now Last lipid panel 08/2017 Calculated ASCVD 10 yr  risk score 6.6% as diabetic but indicated for statin  Plan: 1. Discussed ASCVD risk and DM - offered statin - given risk is low < 7.5% patient opt to lower cholesterol with lifestyle - in future understands statin is recommended 2. Encourage improved lifestyle - low carb/cholesterol, reduce portion size, continue improving regular exercise Follow-up 6 months for lipids repeat       Relevant Medications   metFORMIN (GLUCOPHAGE-XR) 500 MG 24 hr tablet   Type 2 diabetes mellitus with other specified complication (HCC)    Still controlled T2DM A1c at 6.8, prior better controlled in 5s Poor lifestyle currently No hyperglycemia or Hypoglycemia. Complications - other including hyperlipidemia, GERD - increases risk of future cardiovascular complications   Plan:  1. START back on Metformin XR 500mg  daily 2. Encourage improved lifestyle - low carb, low sugar diet, reduce portion size, continue improving restart regular exercise 3. Check urine microalbumin - mild positive 20 - recommend improve DM control in future will consider ACEi/ARB upon re-check - Offered statin - deferred for now 4. DM Foot exam done today / Advised to schedule DM ophtho exam, send record Follow-up 6 months        Relevant Medications   metFORMIN (GLUCOPHAGE-XR) 500 MG 24 hr tablet   Other Relevant Orders   POCT UA - Microalbumin (Completed)    Other Visit Diagnoses    Annual physical exam    -  Primary   Need for diphtheria-tetanus-pertussis (Tdap) vaccine       Relevant Orders   Tdap vaccine greater than or equal to 7yo IM (Completed)      Updated Health Maintenance information - Tdap today - offered Pneumovax-23 - will get in future w/ Flu Shot Reviewed recent lab results with patient Encouraged improvement to lifestyle with diet and exercise - Goal of weight loss   Meds ordered this encounter  Medications  . omeprazole (PRILOSEC) 20 MG capsule    Sig: Take 1 capsule (20 mg total) by mouth daily before  breakfast.    Dispense:  30 capsule    Refill:  5  . metFORMIN (GLUCOPHAGE-XR) 500 MG 24 hr tablet    Sig: Take 1 tablet (500 mg total) by mouth daily with breakfast.    Dispense:  30 tablet    Refill:  5    Follow up plan: Return in about 6 months (around 03/08/2018) for Lab follow-up DM, HLD, LFTs, Weight - discuss med.  Future labs ordered for 02/2018  Completed biometric form, to be faxed to his employer, Isaiah Doyle will keep original copy.  Nobie Putnam, [facility] 09/06/2017,  12:19 AM

## 2017-09-06 ENCOUNTER — Other Ambulatory Visit: Payer: Self-pay | Admitting: Family Medicine

## 2017-09-06 ENCOUNTER — Encounter: Payer: Self-pay | Admitting: Family Medicine

## 2017-09-06 DIAGNOSIS — E785 Hyperlipidemia, unspecified: Secondary | ICD-10-CM

## 2017-09-06 DIAGNOSIS — R7989 Other specified abnormal findings of blood chemistry: Secondary | ICD-10-CM

## 2017-09-06 DIAGNOSIS — R945 Abnormal results of liver function studies: Secondary | ICD-10-CM

## 2017-09-06 DIAGNOSIS — E1169 Type 2 diabetes mellitus with other specified complication: Secondary | ICD-10-CM

## 2017-09-06 NOTE — Assessment & Plan Note (Signed)
Uncontrolled cholesterol poor lifestyle - improving now Last lipid panel 08/2017 Calculated ASCVD 10 yr risk score 6.6% as diabetic but indicated for statin  Plan: 1. Discussed ASCVD risk and DM - offered statin - given risk is low < 7.5% patient opt to lower cholesterol with lifestyle - in future understands statin is recommended 2. Encourage improved lifestyle - low carb/cholesterol, reduce portion size, continue improving regular exercise Follow-up 6 months for lipids repeat

## 2017-09-06 NOTE — Assessment & Plan Note (Signed)
Still controlled T2DM A1c at 6.8, prior better controlled in 5s Poor lifestyle currently No hyperglycemia or Hypoglycemia. Complications - other including hyperlipidemia, GERD - increases risk of future cardiovascular complications   Plan:  1. START back on Metformin XR 500mg  daily 2. Encourage improved lifestyle - low carb, low sugar diet, reduce portion size, continue improving restart regular exercise 3. Check urine microalbumin - mild positive 20 - recommend improve DM control in future will consider ACEi/ARB upon re-check - Offered statin - deferred for now 4. DM Foot exam done today / Advised to schedule DM ophtho exam, send record Follow-up 6 months

## 2017-09-06 NOTE — Assessment & Plan Note (Signed)
Stable, without complication Will send rx omeprazole PPI daily can taper down in future

## 2017-09-06 NOTE — Assessment & Plan Note (Signed)
Likely secondary hepatic steatosis With elevated LDL, DM and wt gain Prior similar history Will re-check CMET in 6 months after lifestyle change - then reconsider statin

## 2017-09-18 DIAGNOSIS — M9901 Segmental and somatic dysfunction of cervical region: Secondary | ICD-10-CM | POA: Diagnosis not present

## 2017-09-18 DIAGNOSIS — M545 Low back pain: Secondary | ICD-10-CM | POA: Diagnosis not present

## 2017-09-18 DIAGNOSIS — M5413 Radiculopathy, cervicothoracic region: Secondary | ICD-10-CM | POA: Diagnosis not present

## 2017-09-18 DIAGNOSIS — M542 Cervicalgia: Secondary | ICD-10-CM | POA: Diagnosis not present

## 2017-10-05 DIAGNOSIS — M9901 Segmental and somatic dysfunction of cervical region: Secondary | ICD-10-CM | POA: Diagnosis not present

## 2017-10-05 DIAGNOSIS — M542 Cervicalgia: Secondary | ICD-10-CM | POA: Diagnosis not present

## 2017-10-05 DIAGNOSIS — M545 Low back pain: Secondary | ICD-10-CM | POA: Diagnosis not present

## 2017-10-05 DIAGNOSIS — M5413 Radiculopathy, cervicothoracic region: Secondary | ICD-10-CM | POA: Diagnosis not present

## 2017-11-07 DIAGNOSIS — M9901 Segmental and somatic dysfunction of cervical region: Secondary | ICD-10-CM | POA: Diagnosis not present

## 2017-11-07 DIAGNOSIS — M545 Low back pain: Secondary | ICD-10-CM | POA: Diagnosis not present

## 2017-11-07 DIAGNOSIS — M5413 Radiculopathy, cervicothoracic region: Secondary | ICD-10-CM | POA: Diagnosis not present

## 2017-11-07 DIAGNOSIS — M542 Cervicalgia: Secondary | ICD-10-CM | POA: Diagnosis not present

## 2017-11-27 DIAGNOSIS — M7711 Lateral epicondylitis, right elbow: Secondary | ICD-10-CM | POA: Diagnosis not present

## 2017-11-27 DIAGNOSIS — M7918 Myalgia, other site: Secondary | ICD-10-CM | POA: Diagnosis not present

## 2017-11-30 DIAGNOSIS — M7918 Myalgia, other site: Secondary | ICD-10-CM | POA: Diagnosis not present

## 2017-11-30 DIAGNOSIS — M7711 Lateral epicondylitis, right elbow: Secondary | ICD-10-CM | POA: Diagnosis not present

## 2017-12-07 DIAGNOSIS — M7918 Myalgia, other site: Secondary | ICD-10-CM | POA: Diagnosis not present

## 2017-12-07 DIAGNOSIS — M7711 Lateral epicondylitis, right elbow: Secondary | ICD-10-CM | POA: Diagnosis not present

## 2018-01-29 DIAGNOSIS — M5413 Radiculopathy, cervicothoracic region: Secondary | ICD-10-CM | POA: Diagnosis not present

## 2018-01-29 DIAGNOSIS — M9901 Segmental and somatic dysfunction of cervical region: Secondary | ICD-10-CM | POA: Diagnosis not present

## 2018-01-29 DIAGNOSIS — M545 Low back pain: Secondary | ICD-10-CM | POA: Diagnosis not present

## 2018-01-29 DIAGNOSIS — M542 Cervicalgia: Secondary | ICD-10-CM | POA: Diagnosis not present

## 2018-01-29 DIAGNOSIS — M7918 Myalgia, other site: Secondary | ICD-10-CM | POA: Diagnosis not present

## 2018-01-29 DIAGNOSIS — M7711 Lateral epicondylitis, right elbow: Secondary | ICD-10-CM | POA: Diagnosis not present

## 2018-02-08 DIAGNOSIS — M7918 Myalgia, other site: Secondary | ICD-10-CM | POA: Diagnosis not present

## 2018-02-08 DIAGNOSIS — M7711 Lateral epicondylitis, right elbow: Secondary | ICD-10-CM | POA: Diagnosis not present

## 2018-02-08 DIAGNOSIS — M5413 Radiculopathy, cervicothoracic region: Secondary | ICD-10-CM | POA: Diagnosis not present

## 2018-02-08 DIAGNOSIS — M542 Cervicalgia: Secondary | ICD-10-CM | POA: Diagnosis not present

## 2018-02-08 DIAGNOSIS — M9901 Segmental and somatic dysfunction of cervical region: Secondary | ICD-10-CM | POA: Diagnosis not present

## 2018-02-08 DIAGNOSIS — M545 Low back pain: Secondary | ICD-10-CM | POA: Diagnosis not present

## 2018-02-23 ENCOUNTER — Encounter: Payer: Self-pay | Admitting: Family Medicine

## 2018-02-23 ENCOUNTER — Other Ambulatory Visit: Payer: Self-pay

## 2018-02-23 ENCOUNTER — Ambulatory Visit: Payer: BLUE CROSS/BLUE SHIELD | Admitting: Family Medicine

## 2018-02-23 VITALS — BP 113/72 | HR 87 | Temp 99.3°F | Resp 16 | Ht 76.0 in | Wt 248.0 lb

## 2018-02-23 DIAGNOSIS — J101 Influenza due to other identified influenza virus with other respiratory manifestations: Secondary | ICD-10-CM

## 2018-02-23 LAB — POCT INFLUENZA A/B
Influenza A, POC: POSITIVE — AB
Influenza B, POC: NEGATIVE

## 2018-02-23 MED ORDER — IPRATROPIUM BROMIDE 0.06 % NA SOLN: 2.0000 | Freq: Four times a day (QID) | NASAL | 0 refills | Status: DC

## 2018-02-23 MED ORDER — BALOXAVIR MARBOXIL(80 MG DOSE) 2 X 40 MG PO TBPK: 80.0000 mg | ORAL_TABLET | Freq: Once | ORAL | 0 refills | Status: AC

## 2018-02-23 MED ORDER — BENZONATATE 100 MG PO CAPS: 100.0000 mg | ORAL_CAPSULE | Freq: Three times a day (TID) | ORAL | 0 refills | Status: DC | PRN

## 2018-02-23 NOTE — Patient Instructions (Addendum)
Thank you for coming to the office today.   1. You tested positive for Influenza A today (rapid flu swab test in office) - Start Xofluza 2 pills today and then done - For your wife, she can start as well due to close contact with the flu, they can take the preventative dose can be 1 capsule daily for 10 days. - Wash hands and cover cough very well to avoid spread of infection - For symptom control:      - Take Ibuprofen / Advil 400-600mg  every 6-8 hours as needed for fever / muscle aches, and may also take Tylenol 500-1000mg  per dose every 6-8 hours or 3 times a day, can alternate dosing      - Start Tessalon perls one every 8 hours or 3 times a day as needed for cough      - Start Atrovent nasal spray decongestant 2 sprays in each nostril up to 4 times daily for 7 days      - Start OTC Mucinex-DM for cough and congestion for up to 7 days - Improve hydration with plenty of clear fluids  If significant worsening with poor fluid intake, worsening fever, difficulty breathing due to coughing, worsening body aches, weakness, or other more concerning symptoms difficulty breathing you can seek treatment at Emergency Department. Also if improved flu symptoms and then worsening days to week later with concerns for bronchitis, productive cough fever chills again we may need to check for possible pneumonia that can occur after the flu    Please schedule a Follow-up Appointment to: Return in about 1 week (around 03/02/2018) for flu.  If you have any other questions or concerns, please feel free to call the office or send a message through [place]. You may also schedule an earlier appointment if necessary.  Additionally, you may be receiving a survey about your experience at our office within a few days to 1 week by e-mail or mail. We value your feedback.  Nobie Putnam, DO [place]

## 2018-02-23 NOTE — Progress Notes (Signed)
Subjective:    Patient ID: Isaiah Hensen., male    DOB: 08-15-1970, 48 y.o.   MRN: [id number]  Isaiah Gahan. is a 48 y.o. 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 Did not take medicine this AM. - No international travel in past 6 months. No sick contact without anyone international travel including [place]. - Tolerating PO well - Prior history of flu in 2016 - Admits some body ache chills, sweats, fatigue - Denies any nausea vomiting, headache, diarrhea  Health Maintenance: Did not get flu shot today  Depression screen [facility] 2/9 02/23/2018 09/05/2017 05/29/2017  Decreased Interest 0 0 0  Down, Depressed, Hopeless 0 0 0  PHQ - 2 Score 0 0 0    Social History   Tobacco Use  . Smoking status: Former Research scientist (life sciences)  . Smokeless tobacco: Former Network engineer Use Topics  . Alcohol use: Yes    Alcohol/week: 12.0 standard drinks    Types: 12 Cans of beer per week  . Drug use: No    Review of Systems Per HPI unless specifically indicated above     Objective:    BP 113/72   Pulse 87   Temp 99.3 F (37.4 C) (Oral)   Resp 16   Ht 6\' 4"  (1.93 m)   Wt 248 lb (112.5 kg)   SpO2 99%   BMI 30.19 kg/m   Wt Readings from Last 3 Encounters:  02/23/18 248 lb (112.5 kg)  09/05/17 255 lb 9.6 oz (115.9 kg)  06/01/17 254 lb 8 oz (115.4 kg)    Physical Exam Vitals signs and nursing note reviewed.  Constitutional:      General: He is not in acute distress.  Appearance: He is well-developed. He is not diaphoretic.     Comments: Mostly well but tired-appearing, comfortable, cooperative  HENT:     Head: Normocephalic and atraumatic.     Comments: Frontal / maxillary sinuses non-tender. Nares patent without purulence or edema. Bilateral TMs clear without erythema, effusion or bulging. Eyes:     General:        Right eye: No discharge.        Left eye: No discharge.     Conjunctiva/sclera: Conjunctivae normal.  Neck:     Musculoskeletal: Normal range of motion and neck supple.     Thyroid: No thyromegaly.  Cardiovascular:     Rate and Rhythm: Normal rate and regular rhythm.     Heart sounds: Normal heart sounds. No murmur.  Pulmonary:     Effort: Pulmonary effort is normal. No respiratory distress.     Breath sounds: Rhonchi (mild) present. No wheezing or rales.     Comments: No focal wheezing or crackles. Good air movement. Speaks full sentences. Musculoskeletal: Normal range of motion.  Lymphadenopathy:     Cervical: No cervical adenopathy.  Skin:    General: Skin is warm and dry.     Findings: No erythema or  rash.  Neurological:     Mental Status: He is alert and oriented to person, place, and time.  Psychiatric:        Behavior: Behavior normal.     Comments: Well groomed, good eye contact, normal speech and thoughts    Results for orders placed or performed in visit on 02/23/18  POCT Influenza A/B  Result Value Ref Range   Influenza A, POC Positive (A) Negative   Influenza B, POC Negative Negative      Assessment & Plan:   Problem List Items Addressed This Visit    None    Visit Diagnoses    Influenza A    -  Primary   Relevant Medications   Baloxavir Marboxil,80 MG Dose, (XOFLUZA) 2 x 40 MG TBPK   ipratropium (ATROVENT) 0.06 % nasal spray   benzonatate (TESSALON) 100 MG capsule   Other Relevant Orders   POCT Influenza A/B (Completed)       Clinically consistent with flu and confirmed Influenza A today on rapid  flu test, with sick contact positive flu. - Duration x 3-4 days, without complication. Tolerating PO and well hydrated - No other focal findings of infection today - Did not receive influenza vaccine this season  Plan: 1. Start Xofluza sample today 40mg  x 2 = 80mg  one dose - Handwritten rx for Tamiflu (oseltamivir) 75mg  daily 10 days prevention given to him for his wife Luka Stohr 01/10/76), but his daughter in law should contact her pediatrician, she may take BID in future if develop symptoms. I advised him that I am offering this and he understands the risks in treating someone without evaluating them, and advised that she should seek a medical opinion and evaluation from her doctor or other provider urgent care / ED if she develops symptoms or concerns for influenza or complication. 2. Supportive care as advised with NSAID / Tylenol PRN fever/myalgias, improve hydration, may take OTC Cold/Flu meds - Start Tessalon Perls take 1 capsule up to 3 times a day as needed for cough - Start Atrovent nasal spray decongestant 2 sprays in each nostril up to 4 times daily for 7 days  Return criteria given if significant worsening, consider post-influenza complications, otherwise follow-up if needed  Work note given   Meds ordered this encounter  Medications  . Baloxavir Marboxil,80 MG Dose, (XOFLUZA) 2 x 40 MG TBPK    Sig: Take 80 mg by mouth once for 1 dose. For Flu    Dispense:  1 each    Refill:  0  . ipratropium (ATROVENT) 0.06 % nasal spray    Sig: Place 2 sprays into both nostrils 4 (four) times daily. For up to 5-7 days then stop.    Dispense:  15 mL    Refill:  0  . benzonatate (TESSALON) 100 MG capsule    Sig: Take 1 capsule (100 mg total) by mouth 3 (three) times daily as needed for cough.    Dispense:  30 capsule    Refill:  0    Follow up plan: Return in about 1 week (around 03/02/2018) for flu.   Nobie Putnam, [facility] 02/23/2018, 10:04 AM

## 2018-03-14 DIAGNOSIS — M7711 Lateral epicondylitis, right elbow: Secondary | ICD-10-CM | POA: Diagnosis not present

## 2018-03-14 DIAGNOSIS — M5413 Radiculopathy, cervicothoracic region: Secondary | ICD-10-CM | POA: Diagnosis not present

## 2018-03-14 DIAGNOSIS — M545 Low back pain: Secondary | ICD-10-CM | POA: Diagnosis not present

## 2018-03-14 DIAGNOSIS — M7918 Myalgia, other site: Secondary | ICD-10-CM | POA: Diagnosis not present

## 2018-03-14 DIAGNOSIS — M9901 Segmental and somatic dysfunction of cervical region: Secondary | ICD-10-CM | POA: Diagnosis not present

## 2018-03-14 DIAGNOSIS — M542 Cervicalgia: Secondary | ICD-10-CM | POA: Diagnosis not present

## 2018-03-19 ENCOUNTER — Other Ambulatory Visit: Payer: Self-pay | Admitting: Family Medicine

## 2018-03-19 DIAGNOSIS — K219 Gastro-esophageal reflux disease without esophagitis: Secondary | ICD-10-CM

## 2018-03-19 DIAGNOSIS — E1169 Type 2 diabetes mellitus with other specified complication: Secondary | ICD-10-CM

## 2018-04-02 DIAGNOSIS — M9901 Segmental and somatic dysfunction of cervical region: Secondary | ICD-10-CM | POA: Diagnosis not present

## 2018-04-02 DIAGNOSIS — M545 Low back pain: Secondary | ICD-10-CM | POA: Diagnosis not present

## 2018-04-02 DIAGNOSIS — M542 Cervicalgia: Secondary | ICD-10-CM | POA: Diagnosis not present

## 2018-04-02 DIAGNOSIS — M5413 Radiculopathy, cervicothoracic region: Secondary | ICD-10-CM | POA: Diagnosis not present

## 2018-04-02 DIAGNOSIS — M7711 Lateral epicondylitis, right elbow: Secondary | ICD-10-CM | POA: Diagnosis not present

## 2018-04-02 DIAGNOSIS — M7918 Myalgia, other site: Secondary | ICD-10-CM | POA: Diagnosis not present

## 2018-04-16 DIAGNOSIS — M9901 Segmental and somatic dysfunction of cervical region: Secondary | ICD-10-CM | POA: Diagnosis not present

## 2018-04-16 DIAGNOSIS — M545 Low back pain: Secondary | ICD-10-CM | POA: Diagnosis not present

## 2018-04-16 DIAGNOSIS — M542 Cervicalgia: Secondary | ICD-10-CM | POA: Diagnosis not present

## 2018-04-16 DIAGNOSIS — M5413 Radiculopathy, cervicothoracic region: Secondary | ICD-10-CM | POA: Diagnosis not present

## 2018-06-07 LAB — HM DIABETES EYE EXAM

## 2018-07-10 ENCOUNTER — Encounter: Payer: Self-pay | Admitting: Family Medicine

## 2018-07-10 ENCOUNTER — Ambulatory Visit: Payer: BLUE CROSS/BLUE SHIELD | Admitting: Family Medicine

## 2018-07-10 ENCOUNTER — Other Ambulatory Visit: Payer: Self-pay

## 2018-07-10 ENCOUNTER — Telehealth: Payer: Self-pay

## 2018-07-10 ENCOUNTER — Other Ambulatory Visit: Payer: Self-pay | Admitting: Family Medicine

## 2018-07-10 VITALS — BP 136/97 | HR 62 | Temp 98.8°F | Resp 16 | Ht 76.0 in | Wt 254.0 lb

## 2018-07-10 DIAGNOSIS — E559 Vitamin D deficiency, unspecified: Secondary | ICD-10-CM

## 2018-07-10 DIAGNOSIS — E1169 Type 2 diabetes mellitus with other specified complication: Secondary | ICD-10-CM

## 2018-07-10 DIAGNOSIS — Z0000 Encounter for general adult medical examination without abnormal findings: Secondary | ICD-10-CM

## 2018-07-10 DIAGNOSIS — R7989 Other specified abnormal findings of blood chemistry: Secondary | ICD-10-CM

## 2018-07-10 LAB — POCT GLYCOSYLATED HEMOGLOBIN (HGB A1C): Hemoglobin A1C: 7.4 % — AB (ref 4.0–5.6)

## 2018-07-10 MED ORDER — OZEMPIC (0.25 OR 0.5 MG/DOSE) 2 MG/1.5ML ~~LOC~~ SOPN: 0.2500 mg | PEN_INJECTOR | SUBCUTANEOUS | 0 refills | Status: DC

## 2018-07-10 NOTE — Telephone Encounter (Signed)
The pt called complaining of sluggish feeling with a elevated non-fasting blood sugar of 245mg . Still taking metformin 500MG  daily with no complication. He was informed that a non fasting blood sugar of 245 MG is not an emergency.  He is past due for a diabetes f/u. Put on the schedule today for a 3:00pm face to face appt.

## 2018-07-10 NOTE — Assessment & Plan Note (Signed)
Elevated A1c, reduced control, at 7.4, previously 6.8 - likely symptoms from hyperglycemia  Poor lifestyle, weight gain No Hypoglycemia. Complications - HYPERglycemia, hyperlipidemia, GERD - increases risk of future cardiovascular complications   Plan:  Start Ozempic GLP1, sample, 0.25mg  weekly x 4 weeks then inc to 0.5mg  weekly, total 6 week supply given today, will notify when ready 5-6 weeks for new rx sent to pharmacy 0.5mg  weekly dose - reviewed risk benefit side effect  Conitnue on Metformin XR 500mg  daily - check at pharmacy if recalled or not, can notify us if want to switch to Metformin IR 500mg  1-2 times day if needed, future can consider off of metformin if stable or improved on ozempic GLP1  Encourage improved lifestyle - low carb, low sugar diet, reduce portion size, continue improving restart regular exercise

## 2018-07-10 NOTE — Progress Notes (Signed)
Subjective:    Patient ID: Isaiah Doyle., male    DOB: 1970/06/28, 48 y.o.   MRN: [id number]  [name]. is a 48 y.o. male presenting on 07/10/2018 for Fatigue (exhausted onset while suspect blood sugar high 245 mg/dl ,HA onset week but denies dizziness)   HPI  CHRONIC DM, Type 2: - Initial dx 2011, he lost a lot of weight and he felt great, then gradual weight gain over years - Reports symptoms started about 1 week or longer, felt sluggish, fatigued, tired and thought maybe higher sugars were contributing - Got a new meter yesterday, started checking again avg 240, he was not checking previously - Gradual wt gain - Limited exercise now more sedentary - Metformin XR 500mg  daily - describes some sticky stools upset stomach loose at times, unsure if his med was recalled Currently not on ACE/ARB Lifestyle: - Diet (not following diet, plans to improve) - Exercise (was not on regular exercise plan) Denies hypoglycemia, polyuria, numbness or tingling   Depression screen [facility] 2/9 07/10/2018 02/23/2018 09/05/2017  Decreased Interest 0 0 0  Down, Depressed, Hopeless 0 0 0  PHQ - 2 Score 0 0 0    Social History   Tobacco Use  . Smoking status: Former Research scientist (life sciences)  . Smokeless tobacco: Former Network engineer Use Topics  . Alcohol use: Yes    Alcohol/week: 12.0 standard drinks    Types: 12 Cans of beer per week  . Drug use: No    Review of Systems Per HPI unless specifically indicated above     Objective:    BP (!) 136/97   Pulse 62   Temp 98.8 F (37.1 C) (Oral)   Resp 16   Ht 6\' 4"  (1.93 m)   Wt 254 lb (115.2 kg)   BMI 30.92 kg/m   Wt Readings from Last 3 Encounters:  07/10/18 254 lb (115.2 kg)  02/23/18 248 lb (112.5 kg)  09/05/17 255 lb 9.6 oz (115.9 kg)    Physical Exam Vitals signs and nursing note reviewed.  Constitutional:      General: He is not in acute distress.    Appearance: He is well-developed. He is not diaphoretic.     Comments:  Well-appearing, comfortable, cooperative  HENT:     Head: Normocephalic and atraumatic.  Eyes:     General:        Right eye: No discharge.        Left eye: No discharge.     Conjunctiva/sclera: Conjunctivae normal.  Cardiovascular:     Rate and Rhythm: Normal rate.  Pulmonary:     Effort: Pulmonary effort is normal.  Skin:    General: Skin is warm and dry.     Findings: No erythema or rash.  Neurological:     Mental Status: He is alert and oriented to person, place, and time.  Psychiatric:        Behavior: Behavior normal.     Comments: Well groomed, good eye contact, normal speech and thoughts      Recent Labs    08/30/17 0833 07/10/18 1523  HGBA1C 6.8* 7.4*     Results for orders placed or performed in visit on 07/10/18  POCT HgB A1C  Result Value Ref Range   Hemoglobin A1C 7.4 (A) 4.0 - 5.6 %      Assessment & Plan:   Problem List Items Addressed This Visit    Type 2 diabetes mellitus with other specified complication ([place]) -  Primary    Elevated A1c, reduced control, at 7.4, previously 6.8 - likely symptoms from hyperglycemia  Poor lifestyle, weight gain No Hypoglycemia. Complications - HYPERglycemia, hyperlipidemia, GERD - increases risk of future cardiovascular complications   Plan:  Start Ozempic GLP1, sample, 0.25mg  weekly x 4 weeks then inc to 0.5mg  weekly, total 6 week supply given today, will notify when ready 5-6 weeks for new rx sent to pharmacy 0.5mg  weekly dose - reviewed risk benefit side effect  Conitnue on Metformin XR 500mg  daily - check at pharmacy if recalled or not, can notify us if want to switch to Metformin IR 500mg  1-2 times day if needed, future can consider off of metformin if stable or improved on ozempic GLP1  Encourage improved lifestyle - low carb, low sugar diet, reduce portion size, continue improving restart regular exercise       Relevant Medications   Semaglutide,0.25 or 0.5MG /DOS, (OZEMPIC, 0.25 OR 0.5 MG/DOSE,) 2  MG/1.5ML SOPN   Other Relevant Orders   POCT HgB A1C (Completed)      Additionally discussed, if symptoms unresolved after treat hyperglycemia and labs unremarkable reconsider other options, such as possible testosterone.  Meds ordered this encounter  Medications  . Semaglutide,0.25 or 0.5MG /DOS, (OZEMPIC, 0.25 OR 0.5 MG/DOSE,) 2 MG/1.5ML SOPN    Sig: Inject 0.25 mg into the skin once a week. For first 4 weeks. Then increase dose to 0.5mg  weekly    Dispense:  1 pen    Refill:  0    Follow up plan: Return in about 2 months (around 09/09/2018) for Annual Physical.  Future labs ordered for 08/2018  Nobie Putnam, [facility] 07/10/2018, 3:19 PM

## 2018-07-10 NOTE — Patient Instructions (Addendum)
Thank you for coming to the office today.  Call insurance find cost and coverage of the following  1. Ozempic (Semaglutide injection) - start 0.25mg  weekly for 4 weeks then increase to 0.5mg  weekly - This one has best benefit of weight loss and reducing Cardiovascular events  Call or send message after 5-6 weeks if doing well, if you are ready for a new rx on your own of this medicine sent to pharmacy, use discount card as well.  In future if still not improved, and no abnormality on blood work - we can also consider Testosterone and other tests.  DUE for FASTING BLOOD WORK (no food or drink after midnight before the lab appointment, only water or coffee without cream/sugar on the morning of)  SCHEDULE "Lab Only" visit in the morning at the clinic for lab draw in 2.5 to 3 MONTHS   - Make sure Lab Only appointment is at about 1 week before your next appointment, so that results will be available  For Lab Results, once available within 2-3 days of blood draw, you can can log in to MyChart online to view your results and a brief explanation. Also, we can discuss results at next follow-up visit.   Please schedule a Follow-up Appointment to: Return in about 2 months (around 09/09/2018) for Annual Physical.  If you have any other questions or concerns, please feel free to call the office or send a message through [facility]. You may also schedule an earlier appointment if necessary.  Additionally, you may be receiving a survey about your experience at our office within a few days to 1 week by e-mail or mail. We value your feedback.  Nobie Putnam, DO [place]

## 2018-07-17 DIAGNOSIS — M542 Cervicalgia: Secondary | ICD-10-CM | POA: Diagnosis not present

## 2018-07-17 DIAGNOSIS — M5413 Radiculopathy, cervicothoracic region: Secondary | ICD-10-CM | POA: Diagnosis not present

## 2018-07-17 DIAGNOSIS — M545 Low back pain: Secondary | ICD-10-CM | POA: Diagnosis not present

## 2018-07-17 DIAGNOSIS — M9901 Segmental and somatic dysfunction of cervical region: Secondary | ICD-10-CM | POA: Diagnosis not present

## 2018-08-06 ENCOUNTER — Telehealth: Payer: Self-pay | Admitting: Family Medicine

## 2018-08-06 DIAGNOSIS — K219 Gastro-esophageal reflux disease without esophagitis: Secondary | ICD-10-CM

## 2018-08-06 MED ORDER — OMEPRAZOLE 20 MG PO CPDR: 20.0000 mg | DELAYED_RELEASE_CAPSULE | Freq: Every day | ORAL | 5 refills | Status: DC

## 2018-08-06 NOTE — Telephone Encounter (Signed)
Pt called requesting refill on omeprazole  [facility]   [facility]

## 2018-08-15 ENCOUNTER — Other Ambulatory Visit: Payer: Self-pay | Admitting: Family Medicine

## 2018-08-15 ENCOUNTER — Telehealth: Payer: Self-pay

## 2018-08-15 DIAGNOSIS — E1169 Type 2 diabetes mellitus with other specified complication: Secondary | ICD-10-CM

## 2018-08-15 MED ORDER — OZEMPIC (0.25 OR 0.5 MG/DOSE) 2 MG/1.5ML ~~LOC~~ SOPN: 0.2500 mg | PEN_INJECTOR | SUBCUTANEOUS | 2 refills | Status: DC

## 2018-08-15 NOTE — Telephone Encounter (Signed)
Patient is requesting a refill of Ozempic to be sent to [place]. Patient has physical in August.

## 2018-08-15 NOTE — Telephone Encounter (Signed)
Ordered [facility]empic. [facility]  Nobie Putnam, [facility] 08/15/2018, 2:16 PM

## 2018-08-16 DIAGNOSIS — M542 Cervicalgia: Secondary | ICD-10-CM | POA: Diagnosis not present

## 2018-08-16 DIAGNOSIS — M545 Low back pain: Secondary | ICD-10-CM | POA: Diagnosis not present

## 2018-08-16 DIAGNOSIS — M9901 Segmental and somatic dysfunction of cervical region: Secondary | ICD-10-CM | POA: Diagnosis not present

## 2018-08-16 DIAGNOSIS — M5413 Radiculopathy, cervicothoracic region: Secondary | ICD-10-CM | POA: Diagnosis not present

## 2018-09-06 ENCOUNTER — Other Ambulatory Visit: Payer: Self-pay

## 2018-09-06 ENCOUNTER — Other Ambulatory Visit: Payer: BC Managed Care – PPO

## 2018-09-06 DIAGNOSIS — Z0000 Encounter for general adult medical examination without abnormal findings: Secondary | ICD-10-CM | POA: Diagnosis not present

## 2018-09-06 DIAGNOSIS — E785 Hyperlipidemia, unspecified: Secondary | ICD-10-CM | POA: Diagnosis not present

## 2018-09-06 DIAGNOSIS — E1169 Type 2 diabetes mellitus with other specified complication: Secondary | ICD-10-CM | POA: Diagnosis not present

## 2018-09-06 DIAGNOSIS — E559 Vitamin D deficiency, unspecified: Secondary | ICD-10-CM

## 2018-09-06 DIAGNOSIS — R7989 Other specified abnormal findings of blood chemistry: Secondary | ICD-10-CM

## 2018-09-07 LAB — CBC WITH DIFFERENTIAL/PLATELET
Absolute Monocytes: 724 cells/uL (ref 200–950)
Basophils Absolute: 50 cells/uL (ref 0–200)
Basophils Relative: 0.7 %
Eosinophils Absolute: 149 cells/uL (ref 15–500)
Eosinophils Relative: 2.1 %
HCT: 44 % (ref 38.5–50.0)
Hemoglobin: 15.1 g/dL (ref 13.2–17.1)
Lymphs Abs: 1931 cells/uL (ref 850–3900)
MCH: 31.4 pg (ref 27.0–33.0)
MCHC: 34.3 g/dL (ref 32.0–36.0)
MCV: 91.5 fL (ref 80.0–100.0)
MPV: 11 fL (ref 7.5–12.5)
Monocytes Relative: 10.2 %
Neutro Abs: 4246 cells/uL (ref 1500–7800)
Neutrophils Relative %: 59.8 %
Platelets: 156 10*3/uL (ref 140–400)
RBC: 4.81 10*6/uL (ref 4.20–5.80)
RDW: 12.2 % (ref 11.0–15.0)
Total Lymphocyte: 27.2 %
WBC: 7.1 10*3/uL (ref 3.8–10.8)

## 2018-09-07 LAB — LIPID PANEL
Cholesterol: 203 mg/dL — ABNORMAL HIGH (ref <200)
HDL: 41 mg/dL (ref >=40)
LDL Cholesterol (Calc): 125 mg/dL (calc) — ABNORMAL HIGH
Non-HDL Cholesterol (Calc): 162 mg/dL (calc) — ABNORMAL HIGH (ref <130)
Total CHOL/HDL Ratio: 5 (calc) — ABNORMAL HIGH (ref <5.0)
Triglycerides: 227 mg/dL — ABNORMAL HIGH (ref <150)

## 2018-09-07 LAB — COMPLETE METABOLIC PANEL WITH GFR
AG Ratio: 1.6 (calc) (ref 1.0–2.5)
ALT: 87 U/L — ABNORMAL HIGH (ref 9–46)
AST: 44 U/L — ABNORMAL HIGH (ref 10–40)
Albumin: 4.6 g/dL (ref 3.6–5.1)
Alkaline phosphatase (APISO): 64 U/L (ref 36–130)
BUN: 14 mg/dL (ref 7–25)
CO2: 26 mmol/L (ref 20–32)
Calcium: 9.6 mg/dL (ref 8.6–10.3)
Chloride: 106 mmol/L (ref 98–110)
Creat: 1.02 mg/dL (ref 0.60–1.35)
GFR, Est African American: 100 mL/min/{1.73_m2} (ref >=60)
GFR, Est Non African American: 86 mL/min/{1.73_m2} (ref >=60)
Globulin: 2.9 g/dL (calc) (ref 1.9–3.7)
Glucose, Bld: 106 mg/dL — ABNORMAL HIGH (ref 65–99)
Potassium: 4.4 mmol/L (ref 3.5–5.3)
Sodium: 141 mmol/L (ref 135–146)
Total Bilirubin: 0.6 mg/dL (ref 0.2–1.2)
Total Protein: 7.5 g/dL (ref 6.1–8.1)

## 2018-09-07 LAB — HEMOGLOBIN A1C
Hgb A1c MFr Bld: 5.9 % of total Hgb — ABNORMAL HIGH (ref <5.7)
Mean Plasma Glucose: 123 (calc)
eAG (mmol/L): 6.8 (calc)

## 2018-09-07 LAB — T4, FREE: Free T4: 1.1 ng/dL (ref 0.8–1.8)

## 2018-09-07 LAB — TSH: TSH: 1.41 mIU/L (ref 0.40–4.50)

## 2018-09-07 LAB — VITAMIN D 25 HYDROXY (VIT D DEFICIENCY, FRACTURES): Vit D, 25-Hydroxy: 37 ng/mL (ref 30–100)

## 2018-09-07 LAB — PSA: PSA: 0.3 ng/mL (ref <=4.0)

## 2018-09-13 ENCOUNTER — Other Ambulatory Visit: Payer: Self-pay

## 2018-09-13 ENCOUNTER — Encounter: Payer: Self-pay | Admitting: Family Medicine

## 2018-09-13 ENCOUNTER — Ambulatory Visit (INDEPENDENT_AMBULATORY_CARE_PROVIDER_SITE_OTHER): Payer: BC Managed Care – PPO | Admitting: Family Medicine

## 2018-09-13 VITALS — BP 128/79 | HR 60 | Temp 98.2°F | Resp 16 | Ht 76.0 in | Wt 239.0 lb

## 2018-09-13 DIAGNOSIS — E1169 Type 2 diabetes mellitus with other specified complication: Secondary | ICD-10-CM

## 2018-09-13 DIAGNOSIS — R945 Abnormal results of liver function studies: Secondary | ICD-10-CM

## 2018-09-13 DIAGNOSIS — Z0000 Encounter for general adult medical examination without abnormal findings: Secondary | ICD-10-CM

## 2018-09-13 DIAGNOSIS — E785 Hyperlipidemia, unspecified: Secondary | ICD-10-CM | POA: Diagnosis not present

## 2018-09-13 DIAGNOSIS — R7989 Other specified abnormal findings of blood chemistry: Secondary | ICD-10-CM

## 2018-09-13 LAB — POCT UA - MICROALBUMIN: Microalbumin Ur, POC: 0 mg/L

## 2018-09-13 NOTE — Progress Notes (Signed)
Subjective:    Patient ID: Isaiah Hensen., male    DOB: 1970-05-10, 48 y.o.   MRN: [id number]  Isaiah Genova. is a 48 y.o. male presenting on 09/13/2018 for Annual Exam   HPI   Here for Annual Physical and Lab Review.  History of Kidney Stone - Followed by [facility], see prior note for information, he was treated in past with flomax, imaging, and pain medication PRN for passing stone. No recent stones, doing well on more water  LFTs Elevated Recent lab shows significantly improved.  CHRONIC DM, Type 2: Reports no concerns, now improving with lifestyle changes. CBGs: Avg 100-120, Low >100, High < 150. Checks CBGs x 1 daily fasting AM Meds: Metformin XR 500mg  daily, Ozempic 0.5mg  weekly Reports  good compliance. Tolerating well w/o side-effects - has some appetite suppression on GLP1 but doing well with it Currently not on ACEi/ARB Lifestyle: Weight down 254 lbs down to 239 lbs - Diet (Breakfast usually protein, egg, protein bar, salad for lunch, and light)  - Exercise (limited exercise due to time) - DM eye done 05/2018 Denies hypoglycemia, polyuria, visual changes, numbness or tingling.  GERD Chronic history, has been controlled on PPI. Was taking intermittently in past, omeprazole 20mg  OTC, request rx  HYPERLIPIDEMIA: - Reports concerns. Last lipid panel 08/2018 improved reduced LDL from 160s to 120s. - Not on cholesterol medicine. No prior statin, was offered in past atorvastatin he did not take it, he is not ready to add other medicine at this time. Father high cholesterol and heart disease - 56 age (initial MI 90s)  Health Maintenance:  Prostate CA Screening: PSA negative 08/2018  UTD TDap  Due for Flu vaccine - not in stock currently will return  Due for pneumonia vaccine as diabetic patient < age 17, need 1st dose Pneumovax-23, will return for this in future.   Depression screen [facility] 2/9 09/13/2018 07/10/2018 02/23/2018  Decreased Interest 0 0 0   Down, Depressed, Hopeless 0 0 0  PHQ - 2 Score 0 0 0    Past Medical History:  Diagnosis Date  . Allergy   . GERD (gastroesophageal reflux disease)    Past Surgical History:  Procedure Laterality Date  . COLONOSCOPY    . ESOPHAGOGASTRODUODENOSCOPY (EGD) WITH PROPOFOL N/A 11/13/2014   Procedure: ESOPHAGOGASTRODUODENOSCOPY (EGD) WITH PROPOFOL;  Surgeon: Hulen Luster, MD;  Location[facility] ENDOSCOPY;  Service: Gastroenterology;  Laterality: N/A;  . TONSILLECTOMY    . WRIST MASS EXCISION     Social History   Socioeconomic History  . Marital status: Married    Spouse name: Not on file  . Number of children: Not on file  . Years of education: Not on file  . Highest education level: Not on file  Occupational History  . Not on file  Social Needs  . Financial resource strain: Not on file  . Food insecurity    Worry: Not on file    Inability: Not on file  . Transportation needs    Medical: Not on file    Non-medical: Not on file  Tobacco Use  . Smoking status: Former Smoker    Quit date: 01/25/2008    Years since quitting: 10.6  . Smokeless tobacco: Former Systems developer  . Tobacco comment: Initially quit 2001, then resumed temporarily 2010  Substance and Sexual Activity  . Alcohol use: Yes    Alcohol/week: 12.0 standard drinks    Types: 12 Cans of beer per week  . Drug use:  No  . Sexual activity: Not on file  Lifestyle  . Physical activity    Days per week: Not on file    Minutes per session: Not on file  . Stress: Not on file  Relationships  . Social Herbalist on phone: Not on file    Gets together: Not on file    Attends religious service: Not on file    Active member of club or organization: Not on file    Attends meetings of clubs or organizations: Not on file    Relationship status: Not on file  . Intimate partner violence    Fear of current or ex partner: Not on file    Emotionally abused: Not on file    Physically abused: Not on file    Forced sexual activity:  Not on file  Other Topics Concern  . Not on file  Social History Narrative  . Not on file   Family History  Problem Relation Age of Onset  . Cancer Mother        BREAST  . Heart attack Father 3  . Heart disease Father 42   Current Outpatient Medications on File Prior to Visit  Medication Sig  . acetaminophen (TYLENOL) 500 MG tablet Take 500 mg by mouth every 6 (six) hours as needed.  . cetirizine (ZYRTEC) 10 MG tablet Take 10 mg by mouth as needed for allergies.  . cyclobenzaprine (FLEXERIL) 10 MG tablet Take 1 tablet (10 mg total) by mouth 3 (three) times daily as needed for muscle spasms (kidney stones).  Marland Kitchen ibuprofen (ADVIL,MOTRIN) 200 MG tablet Take 200 mg by mouth every 6 (six) hours as needed.  . metFORMIN (GLUCOPHAGE-XR) 500 MG 24 hr tablet TAKE 1 TABLET BY MOUTH EVERY DAY WITH BREAKFAST  . Multiple Vitamin (MULTIVITAMIN) tablet Take 1 tablet by mouth daily.  Marland Kitchen omeprazole (PRILOSEC) 20 MG capsule Take 1 capsule (20 mg total) by mouth daily before breakfast.  . OZEMPIC, 0.25 OR 0.5 MG/DOSE, 2 MG/1.5ML SOPN Inject 0.25 mg into the skin once a week. For first 4 weeks. Then increase dose to 0.5mg  weekly  . tamsulosin (FLOMAX) 0.4 MG CAPS capsule Take 1 capsule (0.4 mg total) by mouth daily after breakfast. Only take for passing kidney stone (Patient not taking: Reported on 07/10/2018)   No current facility-administered medications on file prior to visit.     Review of Systems  Constitutional: Negative for activity change, appetite change, chills, diaphoresis, fatigue and fever.  HENT: Negative for congestion and hearing loss.   Eyes: Negative for visual disturbance.  Respiratory: Negative for apnea, cough, chest tightness, shortness of breath and wheezing.   Cardiovascular: Negative for chest pain, palpitations and leg swelling.  Gastrointestinal: Negative for abdominal pain, anal bleeding, blood in stool, constipation, diarrhea, nausea and vomiting.  Endocrine: Negative for cold  intolerance.  Genitourinary: Negative for difficulty urinating, dysuria, frequency and hematuria.  Musculoskeletal: Negative for arthralgias, back pain and neck pain.  Skin: Negative for rash.  Allergic/Immunologic: Negative for environmental allergies.  Neurological: Negative for dizziness, weakness, light-headedness, numbness and headaches.  Hematological: Negative for adenopathy.  Psychiatric/Behavioral: Negative for behavioral problems, dysphoric mood and sleep disturbance. The patient is not nervous/anxious.    Per HPI unless specifically indicated above       Objective:    BP 128/79   Pulse 60   Temp 98.2 F (36.8 C) (Oral)   Resp 16   Ht 6\' 4"  (1.93 m)   Wt 239 lb (  108.4 kg)   BMI 29.09 kg/m   Wt Readings from Last 3 Encounters:  09/13/18 239 lb (108.4 kg)  07/10/18 254 lb (115.2 kg)  02/23/18 248 lb (112.5 kg)    Physical Exam Vitals signs and nursing note reviewed.  Constitutional:      General: He is not in acute distress.    Appearance: He is well-developed. He is not diaphoretic.     Comments: Well-appearing, comfortable, cooperative  HENT:     Head: Normocephalic and atraumatic.  Eyes:     General:        Right eye: No discharge.        Left eye: No discharge.     Conjunctiva/sclera: Conjunctivae normal.     Pupils: Pupils are equal, round, and reactive to light.  Neck:     Musculoskeletal: Normal range of motion and neck supple.     Thyroid: No thyromegaly.     Comments: No carotid bruit Cardiovascular:     Rate and Rhythm: Normal rate and regular rhythm.     Heart sounds: Normal heart sounds. No murmur.  Pulmonary:     Effort: Pulmonary effort is normal. No respiratory distress.     Breath sounds: Normal breath sounds. No wheezing or rales.  Abdominal:     General: Bowel sounds are normal. There is no distension.     Palpations: Abdomen is soft. There is no mass.     Tenderness: There is no abdominal tenderness.  Musculoskeletal: Normal range  of motion.        General: No tenderness.     Comments: Upper / Lower Extremities: - Normal muscle tone, strength bilateral upper extremities 5/5, lower extremities 5/5  Lymphadenopathy:     Cervical: No cervical adenopathy.  Skin:    General: Skin is warm and dry.     Findings: No erythema or rash.  Neurological:     Mental Status: He is alert and oriented to person, place, and time.     Comments: Distal sensation intact to light touch all extremities  Psychiatric:        Behavior: Behavior normal.     Comments: Well groomed, good eye contact, normal speech and thoughts      Diabetic Foot Exam - Simple   Simple Foot Form Diabetic Foot exam was performed with the following findings: Yes 09/13/2018  9:06 AM  Visual Inspection No deformities, no ulcerations, no other skin breakdown bilaterally: Yes Sensation Testing Intact to touch and monofilament testing bilaterally: Yes Pulse Check Posterior Tibialis and Dorsalis pulse intact bilaterally: Yes Comments      Recent Labs    07/10/18 1523 09/06/18 0824  HGBA1C 7.4* 5.9*     Results for orders placed or performed in visit on 09/06/18  T4, free  Result Value Ref Range   Free T4 1.1 0.8 - 1.8 ng/dL  VITAMIN D 25 Hydroxy (Vit-D Deficiency, Fractures)  Result Value Ref Range   Vit D, 25-Hydroxy 37 30 - 100 ng/mL  TSH  Result Value Ref Range   TSH 1.41 0.40 - 4.50 mIU/L  PSA  Result Value Ref Range   PSA 0.3 < OR = 4.0 ng/mL  Lipid panel  Result Value Ref Range   Cholesterol 203 (H) <200 mg/dL   HDL 41 > OR = 40 mg/dL   Triglycerides 227 (H) <150 mg/dL   LDL Cholesterol (Calc) 125 (H) mg/dL (calc)   Total CHOL/HDL Ratio 5.0 (H) <5.0 (calc)   Non-HDL Cholesterol (Calc) 162 (H) <  130 mg/dL (calc)  COMPLETE METABOLIC PANEL WITH GFR  Result Value Ref Range   Glucose, Bld 106 (H) 65 - 99 mg/dL   BUN 14 7 - 25 mg/dL   Creat 1.02 0.60 - 1.35 mg/dL   GFR, Est Non African American 86 > OR = 60 mL/min/1.50m2   GFR, Est  African American 100 > OR = 60 mL/min/1.55m2   BUN/Creatinine Ratio NOT APPLICABLE 6 - 22 (calc)   Sodium 141 135 - 146 mmol/L   Potassium 4.4 3.5 - 5.3 mmol/L   Chloride 106 98 - 110 mmol/L   CO2 26 20 - 32 mmol/L   Calcium 9.6 8.6 - 10.3 mg/dL   Total Protein 7.5 6.1 - 8.1 g/dL   Albumin 4.6 3.6 - 5.1 g/dL   Globulin 2.9 1.9 - 3.7 g/dL (calc)   AG Ratio 1.6 1.0 - 2.5 (calc)   Total Bilirubin 0.6 0.2 - 1.2 mg/dL   Alkaline phosphatase (APISO) 64 36 - 130 U/L   AST 44 (H) 10 - 40 U/L   ALT 87 (H) 9 - 46 U/L  CBC with Differential/Platelet  Result Value Ref Range   WBC 7.1 3.8 - 10.8 Thousand/uL   RBC 4.81 4.20 - 5.80 Million/uL   Hemoglobin 15.1 13.2 - 17.1 g/dL   HCT 44.0 38.5 - 50.0 %   MCV 91.5 80.0 - 100.0 fL   MCH 31.4 27.0 - 33.0 pg   MCHC 34.3 32.0 - 36.0 g/dL   RDW 12.2 11.0 - 15.0 %   Platelets 156 140 - 400 Thousand/uL   MPV 11.0 7.5 - 12.5 fL   Neutro Abs 4,246 1,500 - 7,800 cells/uL   Lymphs Abs 1,931 850 - 3,900 cells/uL   Absolute Monocytes 724 200 - 950 cells/uL   Eosinophils Absolute 149 15 - 500 cells/uL   Basophils Absolute 50 0 - 200 cells/uL   Neutrophils Relative % 59.8 %   Total Lymphocyte 27.2 %   Monocytes Relative 10.2 %   Eosinophils Relative 2.1 %   Basophils Relative 0.7 %  Hemoglobin A1c  Result Value Ref Range   Hgb A1c MFr Bld 5.9 (H) <5.7 % of total Hgb   Mean Plasma Glucose 123 (calc)   eAG (mmol/L) 6.8 (calc)   Results for orders placed or performed in visit on 09/13/18 (from the past 24 hour(s))  POCT UA - Microalbumin     Status: Normal   Collection Time: 09/13/18  9:12 AM  Result Value Ref Range   Microalbumin Ur, POC 0 mg/L        Assessment & Plan:   Problem List Items Addressed This Visit    Elevated LFTs    Improved with lifestyle and reduced cholesterol      Hyperlipidemia associated with type 2 diabetes mellitus (HCC)    Significantly improved cholesterol on improved lifestyle diet now Last lipid panel 08/2018  Calculated ASCVD 10 yr risk score < 8% based on diabetes and other results  Plan: 1. Discussed ASCVD risk and DM - offered statin - patient opt to lower cholesterol with lifestyle - in future understands statin is recommended, will rediscuss age 50+ 2. Encourage improved lifestyle - low carb/cholesterol, reduce portion size, continue improving regular exercise      Type 2 diabetes mellitus with other specified complication (HCC)    Dramatic reduced A1c now to 5.9 from 7.4, improved lifestyle, and on GLP1 Weight loss No Hypoglycemia and RESOLVED Hyperglycemia. Complications - hyperlipidemia, GERD - increases risk of future  cardiovascular complications   Plan:  - CONTINUE Ozempic 0.5mg  weekly injection GLP1 - offered dose inc to 1mg  in future, he will consider this in 1-2 months and notify us if need new rx for improve wt loss - Conitnue on Metformin XR 500mg  daily for now - Encourage improved lifestyle - low carb, low sugar diet, reduce portion size, continue improving restart regular exercise - DM Foot today - Not on ACEi/ARB - check urine microalbumin today - result is 0 - excellent result negative - UTD DM Eye exam 05/2018 - Offered statin, discussed his concern ASCVD due to father history, will defer til age 29+ based on his preferences  Follow-up q 6 months now      Relevant Orders   POCT UA - Microalbumin (Completed)    Other Visit Diagnoses    Annual physical exam    -  Primary      Updated Health Maintenance information  PSA negative.  Due for routine colon cancer screening, >5+ years since last colonoscopy had earlier than age 21 in past. - Discussion today about recommendations for either Colonoscopy or Cologuard screening, benefits and risks of screening, interested in Cologuard, understands that if positive then recommendation is for diagnostic colonoscopy to follow-up.  - Patient advised to contact insurance first to learn cost, will notify us when ready for [place] to  order Cologuard  Reviewed recent lab results with patient Encouraged improvement to lifestyle with diet and exercise - Goal of weight loss   No orders of the defined types were placed in this encounter.  Orders Placed This Encounter  Procedures  . POCT UA - Microalbumin     Follow up plan: Return in about 6 months (around 03/16/2019) for 6 month follow-up DM A1c.  Nobie Putnam, [facility] 09/13/2018, 8:27 AM

## 2018-09-13 NOTE — Assessment & Plan Note (Signed)
Significantly improved cholesterol on improved lifestyle diet now Last lipid panel 08/2018 Calculated ASCVD 10 yr risk score < 8% based on diabetes and other results  Plan: 1. Discussed ASCVD risk and DM - offered statin - patient opt to lower cholesterol with lifestyle - in future understands statin is recommended, will rediscuss age 49+ 2. Encourage improved lifestyle - low carb/cholesterol, reduce portion size, continue improving regular exercise

## 2018-09-13 NOTE — Assessment & Plan Note (Addendum)
Dramatic reduced A1c now to 5.9 from 7.4, improved lifestyle, and on GLP1 Weight loss No Hypoglycemia and RESOLVED Hyperglycemia. Complications - hyperlipidemia, GERD - increases risk of future cardiovascular complications   Plan:  - CONTINUE Ozempic 0.5mg  weekly injection GLP1 - offered dose inc to 1mg  in future, he will consider this in 1-2 months and notify us if need new rx for improve wt loss - Conitnue on Metformin XR 500mg  daily for now - Encourage improved lifestyle - low carb, low sugar diet, reduce portion size, continue improving restart regular exercise - DM Foot today - Not on ACEi/ARB - check urine microalbumin today - result is 0 - excellent result negative - UTD DM Eye exam 05/2018 - Offered statin, discussed his concern ASCVD due to father history, will defer til age 105+ based on his preferences  Follow-up q 6 months now

## 2018-09-13 NOTE — Patient Instructions (Addendum)
Thank you for coming to the office today.  Try to improve walking cardiovascular exercise.  Ozempic is reducing your risk of heart disease, keep up good work  Continue improving lifestyle diet sounds like you are doing great to control sugar and lose weight!  In future after 1-2 months when near end of Ozempic 0.5 dose, can contact us if you want to increase to 25m for better weight loss and cardiovascular protection.  In future reconsider Statin medication and or Farxiga as well to help reduce cardiovascular risk.  Urine test today to check for protein in urine  ------------------------  Colon Cancer Screening: - For all adults age 49+routine colon cancer screening is highly recommended.     - [place] 417- Early detection of colon cancer is important, because often there are no warning signs or symptoms, also if found early usually it can be cured. Late stage is hard to treat.  - If you are not interested in Colonoscopy screening (if done and normal you could be cleared for 5 to 10 years until next due), then Cologuard is an excellent alternative for screening test for Colon Cancer. It is highly sensitive for detecting DNA of colon cancer from even the earliest stages. Also, there is NO bowel prep required. - If Cologuard is NEGATIVE, then it is good for 3 years before next due - If Cologuard is POSITIVE, then it is strongly advised to get a Colonoscopy, which allows the GI doctor to locate the source of the cancer or polyp (even very early stage) and treat it by removing it. ------------------------- If you would like to proceed with Cologuard (stool DNA test) - FIRST, call your insurance company and tell them you want to check cost of Cologuard tell them CPT Code [phone](it may be completely covered and you could get for no cost, OR max cost without any coverage is about $600). Also, keep in mind if you do NOT open the kit,  and decide not to do the test, you will NOT be charged, you should contact the company if you decide not to do the test. - If you want to proceed, you can notify [place](phone message, [place] or at next visit) and we will order it for you. The test kit will be delivered to you house within about 1 week. Follow instructions to collect sample, you may call the company for any help or questions, 24/7 telephone support at [phone]    Please schedule a Follow-up Appointment to: Return in about 6 months (around 03/16/2019) for 6 month follow-up DM A1c.  If you have any other questions or concerns, please feel free to call the office or send a message through [facility] You may also schedule an earlier appointment if necessary.  Additionally, you may be receiving a survey about your experience at our office within a few days to 1 week by e-mail or mail. We value your feedback.  ANobie Putnam DO [facility]

## 2018-09-13 NOTE — Assessment & Plan Note (Signed)
Improved with lifestyle and reduced cholesterol

## 2018-09-27 DIAGNOSIS — M545 Low back pain: Secondary | ICD-10-CM | POA: Diagnosis not present

## 2018-09-27 DIAGNOSIS — M9901 Segmental and somatic dysfunction of cervical region: Secondary | ICD-10-CM | POA: Diagnosis not present

## 2018-09-27 DIAGNOSIS — M542 Cervicalgia: Secondary | ICD-10-CM | POA: Diagnosis not present

## 2018-09-27 DIAGNOSIS — M5413 Radiculopathy, cervicothoracic region: Secondary | ICD-10-CM | POA: Diagnosis not present

## 2018-10-09 DIAGNOSIS — M545 Low back pain: Secondary | ICD-10-CM | POA: Diagnosis not present

## 2018-10-09 DIAGNOSIS — M9901 Segmental and somatic dysfunction of cervical region: Secondary | ICD-10-CM | POA: Diagnosis not present

## 2018-10-09 DIAGNOSIS — M5413 Radiculopathy, cervicothoracic region: Secondary | ICD-10-CM | POA: Diagnosis not present

## 2018-10-09 DIAGNOSIS — M542 Cervicalgia: Secondary | ICD-10-CM | POA: Diagnosis not present

## 2018-10-11 ENCOUNTER — Other Ambulatory Visit: Payer: Self-pay | Admitting: Family Medicine

## 2018-10-11 DIAGNOSIS — E1169 Type 2 diabetes mellitus with other specified complication: Secondary | ICD-10-CM

## 2018-10-27 ENCOUNTER — Other Ambulatory Visit: Payer: Self-pay | Admitting: Family Medicine

## 2018-10-27 DIAGNOSIS — E1169 Type 2 diabetes mellitus with other specified complication: Secondary | ICD-10-CM

## 2018-11-06 ENCOUNTER — Other Ambulatory Visit: Payer: Self-pay | Admitting: Nurse Practitioner

## 2018-11-06 DIAGNOSIS — E1169 Type 2 diabetes mellitus with other specified complication: Secondary | ICD-10-CM

## 2018-11-12 DIAGNOSIS — M9901 Segmental and somatic dysfunction of cervical region: Secondary | ICD-10-CM | POA: Diagnosis not present

## 2018-11-12 DIAGNOSIS — M5413 Radiculopathy, cervicothoracic region: Secondary | ICD-10-CM | POA: Diagnosis not present

## 2018-11-12 DIAGNOSIS — M542 Cervicalgia: Secondary | ICD-10-CM | POA: Diagnosis not present

## 2018-11-12 DIAGNOSIS — M545 Low back pain: Secondary | ICD-10-CM | POA: Diagnosis not present

## 2018-11-20 DIAGNOSIS — M9901 Segmental and somatic dysfunction of cervical region: Secondary | ICD-10-CM | POA: Diagnosis not present

## 2018-11-20 DIAGNOSIS — M542 Cervicalgia: Secondary | ICD-10-CM | POA: Diagnosis not present

## 2018-11-20 DIAGNOSIS — M545 Low back pain: Secondary | ICD-10-CM | POA: Diagnosis not present

## 2018-11-20 DIAGNOSIS — M5413 Radiculopathy, cervicothoracic region: Secondary | ICD-10-CM | POA: Diagnosis not present

## 2019-01-15 DIAGNOSIS — M542 Cervicalgia: Secondary | ICD-10-CM | POA: Diagnosis not present

## 2019-01-15 DIAGNOSIS — M5413 Radiculopathy, cervicothoracic region: Secondary | ICD-10-CM | POA: Diagnosis not present

## 2019-01-15 DIAGNOSIS — M545 Low back pain: Secondary | ICD-10-CM | POA: Diagnosis not present

## 2019-01-15 DIAGNOSIS — M9901 Segmental and somatic dysfunction of cervical region: Secondary | ICD-10-CM | POA: Diagnosis not present

## 2019-02-06 DIAGNOSIS — M545 Low back pain: Secondary | ICD-10-CM | POA: Diagnosis not present

## 2019-02-06 DIAGNOSIS — M25512 Pain in left shoulder: Secondary | ICD-10-CM | POA: Diagnosis not present

## 2019-02-06 DIAGNOSIS — M542 Cervicalgia: Secondary | ICD-10-CM | POA: Diagnosis not present

## 2019-02-06 DIAGNOSIS — M5413 Radiculopathy, cervicothoracic region: Secondary | ICD-10-CM | POA: Diagnosis not present

## 2019-03-09 DIAGNOSIS — Z20822 Contact with and (suspected) exposure to covid-19: Secondary | ICD-10-CM | POA: Diagnosis not present

## 2019-03-09 DIAGNOSIS — R0981 Nasal congestion: Secondary | ICD-10-CM | POA: Diagnosis not present

## 2019-03-15 ENCOUNTER — Ambulatory Visit: Payer: BC Managed Care – PPO | Admitting: Family Medicine

## 2019-03-28 DIAGNOSIS — M545 Low back pain: Secondary | ICD-10-CM | POA: Diagnosis not present

## 2019-03-28 DIAGNOSIS — M542 Cervicalgia: Secondary | ICD-10-CM | POA: Diagnosis not present

## 2019-03-28 DIAGNOSIS — M25512 Pain in left shoulder: Secondary | ICD-10-CM | POA: Diagnosis not present

## 2019-03-28 DIAGNOSIS — M5413 Radiculopathy, cervicothoracic region: Secondary | ICD-10-CM | POA: Diagnosis not present

## 2019-03-29 ENCOUNTER — Other Ambulatory Visit: Payer: Self-pay | Admitting: Family Medicine

## 2019-03-29 ENCOUNTER — Other Ambulatory Visit: Payer: Self-pay

## 2019-03-29 ENCOUNTER — Encounter: Payer: Self-pay | Admitting: Family Medicine

## 2019-03-29 ENCOUNTER — Ambulatory Visit (INDEPENDENT_AMBULATORY_CARE_PROVIDER_SITE_OTHER): Payer: BC Managed Care – PPO | Admitting: Family Medicine

## 2019-03-29 VITALS — BP 118/78 | HR 66 | Temp 97.8°F | Resp 16 | Ht 76.0 in | Wt 239.0 lb

## 2019-03-29 DIAGNOSIS — Z0000 Encounter for general adult medical examination without abnormal findings: Secondary | ICD-10-CM

## 2019-03-29 DIAGNOSIS — E1169 Type 2 diabetes mellitus with other specified complication: Secondary | ICD-10-CM

## 2019-03-29 DIAGNOSIS — E663 Overweight: Secondary | ICD-10-CM | POA: Diagnosis not present

## 2019-03-29 DIAGNOSIS — R7989 Other specified abnormal findings of blood chemistry: Secondary | ICD-10-CM

## 2019-03-29 DIAGNOSIS — I10 Essential (primary) hypertension: Secondary | ICD-10-CM | POA: Insufficient documentation

## 2019-03-29 DIAGNOSIS — H9319 Tinnitus, unspecified ear: Secondary | ICD-10-CM | POA: Insufficient documentation

## 2019-03-29 DIAGNOSIS — Z87442 Personal history of urinary calculi: Secondary | ICD-10-CM | POA: Diagnosis not present

## 2019-03-29 DIAGNOSIS — E785 Hyperlipidemia, unspecified: Secondary | ICD-10-CM

## 2019-03-29 DIAGNOSIS — R030 Elevated blood-pressure reading, without diagnosis of hypertension: Secondary | ICD-10-CM

## 2019-03-29 DIAGNOSIS — H9313 Tinnitus, bilateral: Secondary | ICD-10-CM

## 2019-03-29 DIAGNOSIS — E669 Obesity, unspecified: Secondary | ICD-10-CM | POA: Insufficient documentation

## 2019-03-29 DIAGNOSIS — Z125 Encounter for screening for malignant neoplasm of prostate: Secondary | ICD-10-CM

## 2019-03-29 DIAGNOSIS — K219 Gastro-esophageal reflux disease without esophagitis: Secondary | ICD-10-CM | POA: Diagnosis not present

## 2019-03-29 LAB — POCT GLYCOSYLATED HEMOGLOBIN (HGB A1C): Hemoglobin A1C: 5.6 % (ref 4.0–5.6)

## 2019-03-29 MED ORDER — OZEMPIC (1 MG/DOSE) 2 MG/1.5ML ~~LOC~~ SOPN: 1.0000 mg | PEN_INJECTOR | SUBCUTANEOUS | 1 refills | Status: DC

## 2019-03-29 MED ORDER — OMEPRAZOLE 20 MG PO CPDR: 20.0000 mg | DELAYED_RELEASE_CAPSULE | Freq: Every day | ORAL | 1 refills | Status: DC

## 2019-03-29 MED ORDER — TAMSULOSIN HCL 0.4 MG PO CAPS: 0.4000 mg | ORAL_CAPSULE | Freq: Every day | ORAL | 2 refills | Status: DC

## 2019-03-29 NOTE — Progress Notes (Signed)
Subjective:    Patient ID: Isaiah Doyle., male    DOB: 12-29-1970, 49 y.o.   MRN: [id number]  Isaiah Doyle. is a 49 y.o. male presenting on 03/29/2019 for Diabetes   HPI   CHRONIC DM, Type 2: Reports no concerns, now improving with lifestyle changes. CBGs: Avg 100-120, Low >100, High < 150. Checks CBGs x 1 daily fasting AM Meds: Metformin XR 500mg  daily, Ozempic 0.5mg  weekly Reports  good compliance. Tolerating well w/o side-effects Currently not on ACEi/ARB Lifestyle: Weight down to 220s then back up to 230s - Diet (Similar to last time - high protein options, salads) - Exercise (limited exercise due to time) - DM eye done 05/2018 Denies hypoglycemia, polyuria, visual changes, numbness or tingling.  Elevated BP Reports no new concerns. Chart review has some elevated DBP readings in 90s. No regular check of BP or history of this. Current Meds - None   Denies CP, dyspnea, HA, edema, dizziness / lightheadedness  GERD No problems. Due for refill  Tinnitus Previously seen by [facility] ENT, Dr Tami Ribas, no wax, trial on steroid no relief. Chronic problem. Has had hearing tests in past with declining hearing.  History of Kidney Stones Recent had slight back pain but it has resolved. No actual current symptoms. He usually takes Flomax PRN and is out now, request refill just in case.   Health Maintenance: UTD Flu Vaccine 10/2018  Depression screen [facility] 2/9 03/29/2019 09/13/2018 07/10/2018  Decreased Interest 0 0 0  Down, Depressed, Hopeless 0 0 0  PHQ - 2 Score 0 0 0    Social History   Tobacco Use  . Smoking status: Former Smoker    Quit date: 01/25/2008    Years since quitting: 11.1  . Smokeless tobacco: Former Systems developer  . Tobacco comment: Initially quit 2001, then resumed temporarily 2010  Substance Use Topics  . Alcohol use: Yes    Alcohol/week: 12.0 standard drinks    Types: 12 Cans of beer per week  . Drug use: No    Review of Systems Per HPI unless  specifically indicated above     Objective:    BP 118/78 (BP Location: Left Arm, Cuff Size: Normal)   Pulse 66   Temp 97.8 F (36.6 C) (Temporal)   Resp 16   Ht 6\' 4"  (1.93 m)   Wt 239 lb (108.4 kg)   BMI 29.09 kg/m   Wt Readings from Last 3 Encounters:  03/29/19 239 lb (108.4 kg)  09/13/18 239 lb (108.4 kg)  07/10/18 254 lb (115.2 kg)    Physical Exam Vitals and nursing note reviewed.  Constitutional:      General: He is not in acute distress.    Appearance: He is well-developed. He is not diaphoretic.     Comments: Well-appearing, comfortable, cooperative  HENT:     Head: Normocephalic and atraumatic.  Eyes:     General:        Right eye: No discharge.        Left eye: No discharge.     Conjunctiva/sclera: Conjunctivae normal.  Neck:     Thyroid: No thyromegaly.  Cardiovascular:     Rate and Rhythm: Normal rate and regular rhythm.     Heart sounds: Normal heart sounds. No murmur.  Pulmonary:     Effort: Pulmonary effort is normal. No respiratory distress.     Breath sounds: Normal breath sounds. No wheezing or rales.  Musculoskeletal:        General:  Normal range of motion.     Cervical back: Normal range of motion and neck supple.  Lymphadenopathy:     Cervical: No cervical adenopathy.  Skin:    General: Skin is warm and dry.     Findings: No erythema or rash.  Neurological:     Mental Status: He is alert and oriented to person, place, and time.  Psychiatric:        Behavior: Behavior normal.     Comments: Well groomed, good eye contact, normal speech and thoughts      Recent Labs    07/10/18 1523 09/06/18 0824 03/29/19 0814  HGBA1C 7.4* 5.9* 5.6    Results for orders placed or performed in visit on 03/29/19  POCT HgB A1C  Result Value Ref Range   Hemoglobin A1C 5.6 4.0 - 5.6 %      Assessment & Plan:   Problem List Items Addressed This Visit    Type 2 diabetes mellitus with other specified complication ([place]) - Primary    Further reduced  A1c now to 5.6 On lifestyle and GLP1 Some weight loss, and gain again No Hypoglycemia and RESOLVED Hyperglycemia. Complications - hyperlipidemia, GERD - increases risk of future cardiovascular complications   Plan:  INCREASE Ozempic from 0.5 up to 1mg  injection weekly, can finish 0.5 double dose if prefer DISCONTINUE Metformin XR 500mg  daily for now - Encourage improved lifestyle - low carb, low sugar diet, reduce portion size, continue improving restart regular exercise - Not on ACEi/ARB - last urine micro negative 08/2018 - UTD DM Eye exam 05/2018 - next due 05/2019 - Offered statin, discussed his concern ASCVD due to father history, will defer til age 66+ based on his preferences      Relevant Medications   OZEMPIC, 1 MG/DOSE, 2 MG/1.5ML SOPN   Other Relevant Orders   POCT HgB A1C (Completed)   Tinnitus    Chronic problem. Has seen [facility] Dr Tami Ribas in past. No clear diagnosis or management.      Overweight (BMI 25.0-29.9)    Some fluctuation of weight Stable to improving. Dose increase on GLP1 now       GERD (gastroesophageal reflux disease)    Controlled Refill omeprazole      Relevant Medications   omeprazole (PRILOSEC) 20 MG capsule   Elevated BP without diagnosis of hypertension    Mild elevated DBP, majorly improve on recheck Chart review several 90s  Check BP at home if able Continue lifestyle, counseling Follow-up       Other Visit Diagnoses    History of nephrolithiasis       Relevant Medications   tamsulosin (FLOMAX) 0.4 MG CAPS capsule    No active nephrolithiasis Will re order his tamsulosin PRN, he had mild symptoms other day but resolved.   Meds ordered this encounter  Medications  . tamsulosin (FLOMAX) 0.4 MG CAPS capsule    Sig: Take 1 capsule (0.4 mg total) by mouth daily after breakfast. Only take for passing kidney stone    Dispense:  30 capsule    Refill:  2  . OZEMPIC, 1 MG/DOSE, 2 MG/1.5ML SOPN    Sig: Inject 1 mg into the  skin once a week.    Dispense:  6 pen    Refill:  1    90 day supply, dose change from 0.5 to 1  . omeprazole (PRILOSEC) 20 MG capsule    Sig: Take 1 capsule (20 mg total) by mouth daily before breakfast.  Dispense:  90 capsule    Refill:  1     Follow up plan: Return in about 6 months (around 09/29/2019) for Annual Physical.  Future labs ordered for  09/13/19  Nobie Putnam, DO [facility] 03/29/2019, 8:12 AM

## 2019-03-29 NOTE — Assessment & Plan Note (Signed)
Chronic problem. Has seen [facility] Dr Tami Ribas in past. No clear diagnosis or management.

## 2019-03-29 NOTE — Assessment & Plan Note (Signed)
Some fluctuation of weight Stable to improving. Dose increase on GLP1 now

## 2019-03-29 NOTE — Assessment & Plan Note (Signed)
Mild elevated DBP, majorly improve on recheck Chart review several 90s  Check BP at home if able Continue lifestyle, counseling Follow-up

## 2019-03-29 NOTE — Assessment & Plan Note (Signed)
Further reduced A1c now to 5.6 On lifestyle and GLP1 Some weight loss, and gain again No Hypoglycemia and RESOLVED Hyperglycemia. Complications - hyperlipidemia, GERD - increases risk of future cardiovascular complications   Plan:  INCREASE Ozempic from 0.5 up to 1mg  injection weekly, can finish 0.5 double dose if prefer DISCONTINUE Metformin XR 500mg  daily for now - Encourage improved lifestyle - low carb, low sugar diet, reduce portion size, continue improving restart regular exercise - Not on ACEi/ARB - last urine micro negative 08/2018 - UTD DM Eye exam 05/2018 - next due 05/2019 - Offered statin, discussed his concern ASCVD due to father history, will defer til age 63+ based on his preferences

## 2019-03-29 NOTE — Patient Instructions (Addendum)
Thank you for coming to the office today.  Increase Ozempic from 0.5 to 1mg , new pen ordered. eventually this will change from 2 doses per pen, up to 4 doses per pen.  Can double dose 0.5mg  x2 doses one on each side.  Discontinue Metformin  Check BP occasionally, concern with bottom number >90 sometimes.  Refilled Omeprazole, Flomax as needed   DUE for FASTING BLOOD WORK (no food or drink after midnight before the lab appointment, only water or coffee without cream/sugar on the morning of)  SCHEDULE "Lab Only" visit in the morning at the clinic for lab draw in 6 MONTHS   - Make sure Lab Only appointment is at about 1 week before your next appointment, so that results will be available  For Lab Results, once available within 2-3 days of blood draw, you can can log in to MyChart online to view your results and a brief explanation. Also, we can discuss results at next follow-up visit.   Please schedule a Follow-up Appointment to: Return in about 6 months (around 09/29/2019) for Annual Physical.  If you have any other questions or concerns, please feel free to call the office or send a message through [place]. You may also schedule an earlier appointment if necessary.  Additionally, you may be receiving a survey about your experience at our office within a few days to 1 week by e-mail or mail. We value your feedback.  Nobie Putnam, DO [place]

## 2019-03-29 NOTE — Assessment & Plan Note (Signed)
Controlled Refill omeprazole

## 2019-04-22 DIAGNOSIS — M545 Low back pain: Secondary | ICD-10-CM | POA: Diagnosis not present

## 2019-04-22 DIAGNOSIS — M5413 Radiculopathy, cervicothoracic region: Secondary | ICD-10-CM | POA: Diagnosis not present

## 2019-04-22 DIAGNOSIS — M25512 Pain in left shoulder: Secondary | ICD-10-CM | POA: Diagnosis not present

## 2019-04-22 DIAGNOSIS — M542 Cervicalgia: Secondary | ICD-10-CM | POA: Diagnosis not present

## 2019-04-26 ENCOUNTER — Ambulatory Visit: Payer: Self-pay | Attending: Internal Medicine

## 2019-04-26 DIAGNOSIS — Z23 Encounter for immunization: Secondary | ICD-10-CM

## 2019-04-26 NOTE — Progress Notes (Signed)
   [id number] Vaccination Clinic  Name:  Tal Mccreedy.    MRN: [id number] DOB: September 26, 1970  04/26/2019  Mr. Cella was observed post Covid-19 immunization for 15 minutes without incident. He was provided with Vaccine Information Sheet and instruction to access the V-Safe system.   Mr. Midura was instructed to call 911 with any severe reactions post vaccine: Marland Kitchen Difficulty breathing  . Swelling of face and throat  . A fast heartbeat  . A bad rash all over body  . Dizziness and weakness   Immunizations Administered    Name Date Dose VIS Date Route   Pfizer COVID-19 Vaccine 04/26/2019  8:19 AM 0.3 mL 01/04/2019 Intramuscular   Manufacturer: [place]   Lot: [id number]   [place]: [id number]

## 2019-05-09 DIAGNOSIS — M5413 Radiculopathy, cervicothoracic region: Secondary | ICD-10-CM | POA: Diagnosis not present

## 2019-05-09 DIAGNOSIS — M25512 Pain in left shoulder: Secondary | ICD-10-CM | POA: Diagnosis not present

## 2019-05-09 DIAGNOSIS — M542 Cervicalgia: Secondary | ICD-10-CM | POA: Diagnosis not present

## 2019-05-09 DIAGNOSIS — M545 Low back pain: Secondary | ICD-10-CM | POA: Diagnosis not present

## 2019-05-21 ENCOUNTER — Ambulatory Visit: Payer: Self-pay | Attending: Internal Medicine

## 2019-05-21 DIAGNOSIS — M5413 Radiculopathy, cervicothoracic region: Secondary | ICD-10-CM | POA: Diagnosis not present

## 2019-05-21 DIAGNOSIS — M545 Low back pain: Secondary | ICD-10-CM | POA: Diagnosis not present

## 2019-05-21 DIAGNOSIS — M25512 Pain in left shoulder: Secondary | ICD-10-CM | POA: Diagnosis not present

## 2019-05-21 DIAGNOSIS — M542 Cervicalgia: Secondary | ICD-10-CM | POA: Diagnosis not present

## 2019-05-21 DIAGNOSIS — Z23 Encounter for immunization: Secondary | ICD-10-CM

## 2019-05-21 NOTE — Progress Notes (Signed)
   [id number] Vaccination Clinic  Name:  Isaiah Doyle.    MRN: [id number] DOB: [birth year]  05/21/2019  [name] was observed post Covid-19 immunization for 15 minutes without incident. He was provided with Vaccine Information Sheet and instruction to access the V-Safe system.   [name] was instructed to call 911 with any severe reactions post vaccine: Marland Kitchen Difficulty breathing  . Swelling of face and throat  . A fast heartbeat  . A bad rash all over body  . Dizziness and weakness   Immunizations Administered    Name Date Dose VIS Date Route   Pfizer COVID-19 Vaccine 05/21/2019  4:36 PM 0.3 mL 03/20/2018 Intramuscular   Manufacturer: [place]   Lot: [id number]   [place]: [id number]

## 2019-06-03 DIAGNOSIS — M25512 Pain in left shoulder: Secondary | ICD-10-CM | POA: Diagnosis not present

## 2019-06-03 DIAGNOSIS — M545 Low back pain: Secondary | ICD-10-CM | POA: Diagnosis not present

## 2019-06-03 DIAGNOSIS — M5413 Radiculopathy, cervicothoracic region: Secondary | ICD-10-CM | POA: Diagnosis not present

## 2019-06-03 DIAGNOSIS — M542 Cervicalgia: Secondary | ICD-10-CM | POA: Diagnosis not present

## 2019-06-23 ENCOUNTER — Other Ambulatory Visit: Payer: Self-pay | Admitting: Family Medicine

## 2019-06-23 DIAGNOSIS — Z87442 Personal history of urinary calculi: Secondary | ICD-10-CM

## 2019-06-23 NOTE — Telephone Encounter (Signed)
Requested Prescriptions  Pending Prescriptions Disp Refills  . tamsulosin (FLOMAX) 0.4 MG CAPS capsule [Pharmacy Med Name: TAMSULOSIN HCL 0.4 MG CAPSULE] 30 capsule 2    Sig: TAKE 1 CAPSULE (0.4 MG TOTAL) BY MOUTH DAILY AFTER BREAKFAST. ONLY TAKE FOR PASSING KIDNEY STONE     Urology: Alpha-Adrenergic Blocker Passed - 06/23/2019 12:49 AM      Passed - Last BP in normal range    BP Readings from Last 1 Encounters:  03/29/19 118/78         Passed - Valid encounter within last 12 months    Recent Outpatient Visits          2 months ago Type 2 diabetes mellitus with other specified complication, without long-term current use of insulin [facility])   Cook, DO   9 months ago Annual physical exam   [facility] Olin Hauser, DO   11 months ago Type 2 diabetes mellitus with other specified complication, without long-term current use of insulin [facility])   [facility] Olin Hauser, DO   1 year ago Influenza A   [facility] Olin Hauser, DO   1 year ago Annual physical exam   [place], Devonne Doughty, DO

## 2019-07-04 DIAGNOSIS — M542 Cervicalgia: Secondary | ICD-10-CM | POA: Diagnosis not present

## 2019-07-04 DIAGNOSIS — M5413 Radiculopathy, cervicothoracic region: Secondary | ICD-10-CM | POA: Diagnosis not present

## 2019-07-04 DIAGNOSIS — M25512 Pain in left shoulder: Secondary | ICD-10-CM | POA: Diagnosis not present

## 2019-07-04 DIAGNOSIS — M545 Low back pain: Secondary | ICD-10-CM | POA: Diagnosis not present

## 2019-07-23 DIAGNOSIS — M545 Low back pain: Secondary | ICD-10-CM | POA: Diagnosis not present

## 2019-07-23 DIAGNOSIS — M5413 Radiculopathy, cervicothoracic region: Secondary | ICD-10-CM | POA: Diagnosis not present

## 2019-07-23 DIAGNOSIS — M542 Cervicalgia: Secondary | ICD-10-CM | POA: Diagnosis not present

## 2019-07-23 DIAGNOSIS — M25512 Pain in left shoulder: Secondary | ICD-10-CM | POA: Diagnosis not present

## 2019-07-30 DIAGNOSIS — M542 Cervicalgia: Secondary | ICD-10-CM | POA: Diagnosis not present

## 2019-07-30 DIAGNOSIS — M5413 Radiculopathy, cervicothoracic region: Secondary | ICD-10-CM | POA: Diagnosis not present

## 2019-07-30 DIAGNOSIS — M545 Low back pain: Secondary | ICD-10-CM | POA: Diagnosis not present

## 2019-07-30 DIAGNOSIS — M25512 Pain in left shoulder: Secondary | ICD-10-CM | POA: Diagnosis not present

## 2019-08-16 LAB — HM DIABETES EYE EXAM

## 2019-08-20 ENCOUNTER — Encounter: Payer: Self-pay | Admitting: Family Medicine

## 2019-09-09 DIAGNOSIS — M545 Low back pain: Secondary | ICD-10-CM | POA: Diagnosis not present

## 2019-09-09 DIAGNOSIS — M5413 Radiculopathy, cervicothoracic region: Secondary | ICD-10-CM | POA: Diagnosis not present

## 2019-09-09 DIAGNOSIS — M542 Cervicalgia: Secondary | ICD-10-CM | POA: Diagnosis not present

## 2019-09-09 DIAGNOSIS — M25512 Pain in left shoulder: Secondary | ICD-10-CM | POA: Diagnosis not present

## 2019-09-13 ENCOUNTER — Other Ambulatory Visit: Payer: BC Managed Care – PPO

## 2019-09-13 ENCOUNTER — Other Ambulatory Visit: Payer: Self-pay

## 2019-09-13 DIAGNOSIS — Z0000 Encounter for general adult medical examination without abnormal findings: Secondary | ICD-10-CM

## 2019-09-13 DIAGNOSIS — E663 Overweight: Secondary | ICD-10-CM

## 2019-09-13 DIAGNOSIS — E785 Hyperlipidemia, unspecified: Secondary | ICD-10-CM

## 2019-09-13 DIAGNOSIS — E1169 Type 2 diabetes mellitus with other specified complication: Secondary | ICD-10-CM | POA: Diagnosis not present

## 2019-09-13 DIAGNOSIS — R030 Elevated blood-pressure reading, without diagnosis of hypertension: Secondary | ICD-10-CM | POA: Diagnosis not present

## 2019-09-13 DIAGNOSIS — R7989 Other specified abnormal findings of blood chemistry: Secondary | ICD-10-CM

## 2019-09-13 DIAGNOSIS — Z125 Encounter for screening for malignant neoplasm of prostate: Secondary | ICD-10-CM

## 2019-09-14 LAB — COMPLETE METABOLIC PANEL WITH GFR
AG Ratio: 1.9 (calc) (ref 1.0–2.5)
ALT: 150 U/L — ABNORMAL HIGH (ref 9–46)
AST: 68 U/L — ABNORMAL HIGH (ref 10–35)
Albumin: 4.8 g/dL (ref 3.6–5.1)
Alkaline phosphatase (APISO): 59 U/L (ref 35–144)
BUN: 14 mg/dL (ref 7–25)
CO2: 27 mmol/L (ref 20–32)
Calcium: 9.3 mg/dL (ref 8.6–10.3)
Chloride: 105 mmol/L (ref 98–110)
Creat: 1.02 mg/dL (ref 0.70–1.33)
GFR, Est African American: 99 mL/min/{1.73_m2} (ref >=60)
GFR, Est Non African American: 85 mL/min/{1.73_m2} (ref >=60)
Globulin: 2.5 g/dL (calc) (ref 1.9–3.7)
Glucose, Bld: 105 mg/dL — ABNORMAL HIGH (ref 65–99)
Potassium: 4 mmol/L (ref 3.5–5.3)
Sodium: 141 mmol/L (ref 135–146)
Total Bilirubin: 1 mg/dL (ref 0.2–1.2)
Total Protein: 7.3 g/dL (ref 6.1–8.1)

## 2019-09-14 LAB — HEMOGLOBIN A1C
Hgb A1c MFr Bld: 5.8 % of total Hgb — ABNORMAL HIGH (ref <5.7)
Mean Plasma Glucose: 120 (calc)
eAG (mmol/L): 6.6 (calc)

## 2019-09-14 LAB — CBC WITH DIFFERENTIAL/PLATELET
Absolute Monocytes: 770 cells/uL (ref 200–950)
Basophils Absolute: 79 cells/uL (ref 0–200)
Basophils Relative: 1.1 %
Eosinophils Absolute: 144 cells/uL (ref 15–500)
Eosinophils Relative: 2 %
HCT: 42.9 % (ref 38.5–50.0)
Hemoglobin: 15.1 g/dL (ref 13.2–17.1)
Lymphs Abs: 2527 cells/uL (ref 850–3900)
MCH: 31.6 pg (ref 27.0–33.0)
MCHC: 35.2 g/dL (ref 32.0–36.0)
MCV: 89.7 fL (ref 80.0–100.0)
MPV: 10.1 fL (ref 7.5–12.5)
Monocytes Relative: 10.7 %
Neutro Abs: 3679 cells/uL (ref 1500–7800)
Neutrophils Relative %: 51.1 %
Platelets: 162 10*3/uL (ref 140–400)
RBC: 4.78 10*6/uL (ref 4.20–5.80)
RDW: 12.6 % (ref 11.0–15.0)
Total Lymphocyte: 35.1 %
WBC: 7.2 10*3/uL (ref 3.8–10.8)

## 2019-09-14 LAB — LIPID PANEL
Cholesterol: 221 mg/dL — ABNORMAL HIGH (ref <200)
HDL: 45 mg/dL (ref >=40)
LDL Cholesterol (Calc): 144 mg/dL (calc) — ABNORMAL HIGH
Non-HDL Cholesterol (Calc): 176 mg/dL (calc) — ABNORMAL HIGH (ref <130)
Total CHOL/HDL Ratio: 4.9 (calc) (ref <5.0)
Triglycerides: 188 mg/dL — ABNORMAL HIGH (ref <150)

## 2019-09-14 LAB — PSA: PSA: 0.2 ng/mL (ref <=4.0)

## 2019-09-18 ENCOUNTER — Ambulatory Visit (INDEPENDENT_AMBULATORY_CARE_PROVIDER_SITE_OTHER): Payer: BC Managed Care – PPO | Admitting: Family Medicine

## 2019-09-18 ENCOUNTER — Encounter: Payer: Self-pay | Admitting: Family Medicine

## 2019-09-18 ENCOUNTER — Other Ambulatory Visit: Payer: Self-pay

## 2019-09-18 ENCOUNTER — Other Ambulatory Visit: Payer: Self-pay | Admitting: Family Medicine

## 2019-09-18 VITALS — BP 134/86 | HR 61 | Temp 97.7°F | Resp 16 | Ht 76.0 in | Wt 243.6 lb

## 2019-09-18 DIAGNOSIS — R7989 Other specified abnormal findings of blood chemistry: Secondary | ICD-10-CM

## 2019-09-18 DIAGNOSIS — R030 Elevated blood-pressure reading, without diagnosis of hypertension: Secondary | ICD-10-CM

## 2019-09-18 DIAGNOSIS — E1169 Type 2 diabetes mellitus with other specified complication: Secondary | ICD-10-CM | POA: Diagnosis not present

## 2019-09-18 DIAGNOSIS — Z0000 Encounter for general adult medical examination without abnormal findings: Secondary | ICD-10-CM | POA: Diagnosis not present

## 2019-09-18 DIAGNOSIS — K760 Fatty (change of) liver, not elsewhere classified: Secondary | ICD-10-CM

## 2019-09-18 DIAGNOSIS — Z1211 Encounter for screening for malignant neoplasm of colon: Secondary | ICD-10-CM

## 2019-09-18 DIAGNOSIS — Z23 Encounter for immunization: Secondary | ICD-10-CM | POA: Diagnosis not present

## 2019-09-18 DIAGNOSIS — E663 Overweight: Secondary | ICD-10-CM

## 2019-09-18 DIAGNOSIS — E785 Hyperlipidemia, unspecified: Secondary | ICD-10-CM

## 2019-09-18 LAB — POCT UA - MICROALBUMIN: Microalbumin Ur, POC: 0 mg/L

## 2019-09-18 NOTE — Assessment & Plan Note (Signed)
DM A1c controlled at 5.8 On lifestyle and GLP1 Some weight loss, and gain again No Hypoglycemia and RESOLVED Hyperglycemia. Complications - hyperlipidemia, GERD - increases risk of future cardiovascular complications   Plan:  Continue Ozempic 1mg  weekly - will order new pen when he needs refill. Should be changed to 4mg /39mL for the 1mg  dosing pen now, 4 doses per pen, = 3 pens for 90 day supply - Encourage improved lifestyle - low carb, low sugar diet, reduce portion size, continue improving restart regular exercise - Not on ACEi/ARB - urine micro negative today, 0 Defer statin due to elevated LFTs for now - DM Eye updated 07/2019 DM Foot exam done

## 2019-09-18 NOTE — Patient Instructions (Addendum)
Thank you for coming to the office today.  Check BP occasionally  Pneumovax 23 booster today, good til 65  Next time can do Shingrix shingles vaccine, 2 dose series, about 2 months apart, can cause side effects of flu like symptoms.   Repeat labs in 3 months, fasting for liver. Try to limit alcohol, weight loss Future maybe ultrasound of liver and consider GI specialist.  Flu shot today  Urine test for protein / diabetes.  Recent Labs    03/29/19 0814 09/13/19 0828  HGBA1C 5.6 5.8*    [facility]) [place], [place] [address] Phone: [phone]  Stay tuned for your apt to setup colonoscopy.   DUE for FASTING BLOOD WORK (no food or drink after midnight before the lab appointment, only water or coffee without cream/sugar on the morning of)  SCHEDULE "Lab Only" visit in the morning at the clinic for lab draw in 3 MONTHS   - Make sure Lab Only appointment is at about 1 week before your next appointment, so that results will be available  For Lab Results, once available within 2-3 days of blood draw, you can can log in to MyChart online to view your results and a brief explanation. Also, we can discuss results at next follow-up visit.    Please schedule a Follow-up Appointment to: Return in about 3 months (around 12/19/2019) for 3 month fasting lab only then 1 week later follow-up Liver LFTs, BP, weight.  If you have any other questions or concerns, please feel free to call the office or send a message through [place]. You may also schedule an earlier appointment if necessary.  Additionally, you may be receiving a survey about your experience at our office within a few days to 1 week by e-mail or mail. We value your feedback.  Nobie Putnam, DO [place]

## 2019-09-18 NOTE — Progress Notes (Signed)
Subjective:    Patient ID: Isaiah Doyle., male    DOB: 1970-07-21, 49 y.o.   MRN: [id number]  Isaiah Doyle. is a 49 y.o. male presenting on 09/18/2019 for Annual Exam   HPI  Here for Annual Physical and Lab Review.  CHRONIC DM, Type 2: Reportsno concerns, now improving with lifestyle changes. CBGs: Avg100-120, Low>100, High< 150. Checks CBGsx 1 daily fasting AM Meds:Ozempic 1mg  weekly Off Metformin Reports good compliance. Tolerating well w/o side-effects Currentlynot on ACEi/ARB Lifestyle: Weight down to 220s then back up to 230s - Diet (Similar to last time - high protein options, salads) - Exercise (limited exercise due to time) Denies hypoglycemia, polyuria, visual changes, numbness or tingling.  HYPERLIPIDEMIA: Weight gain, elevated lipids Last lipid panel 08/2019, elevated LDL again Not on statin  Elevated BP Reports no new concerns. Chart review has some elevated DBP readings in 90s. No regular check of BP or history of this. Current Meds - None   Denies CP, dyspnea, HA, edema, dizziness / lightheadedness  Tinnitus Previously seen by [facility], Dr Tami Ribas, no wax, trial on steroid no relief. Chronic problem. Has had hearing tests in past with declining hearing.  History of Kidney Stones Recent had slight back pain but it has resolved. No actual current symptoms. He usually takes Flomax PRN none lately.  Elevated LFTs Drinks 2-3 shots of liquor nightly Previously lost weight in past when had elevated LFTs in 2011, had seen Endocrinologist, this resolved back then.  Health Maintenance: Due for Flu Shot, will receive today   Prostate CA Screening: PSA negative 08/2019  UTD TDap  Colon CA Screening: Last Colonoscopy Dr [facility] 2016, had one prior, results with negative no polyp, good for 5 years. Currently asymptomatic. known family history of colon CA paternal grandfather colon CA age 26. Due for screening test return to GI for  colonoscopy referral  Due for pneumonia vaccine pneumovax23 booster age < 72, with diabetes.  Next time will start Shingrix series   Depression screen [facility] 2/9 09/18/2019 09/18/2019 03/29/2019  Decreased Interest 0 - 0  Down, Depressed, Hopeless 0 0 0  PHQ - 2 Score 0 0 0    Past Medical History:  Diagnosis Date  . Allergy   . GERD (gastroesophageal reflux disease)    Past Surgical History:  Procedure Laterality Date  . COLONOSCOPY    . ESOPHAGOGASTRODUODENOSCOPY (EGD) WITH PROPOFOL N/A 11/13/2014   Procedure: ESOPHAGOGASTRODUODENOSCOPY (EGD) WITH PROPOFOL;  Surgeon: Hulen Luster, MD;  Location[facility] ENDOSCOPY;  Service: Gastroenterology;  Laterality: N/A;  . TONSILLECTOMY    . WRIST MASS EXCISION     Social History   Socioeconomic History  . Marital status: Married    Spouse name: Not on file  . Number of children: Not on file  . Years of education: Not on file  . Highest education level: Not on file  Occupational History  . Not on file  Tobacco Use  . Smoking status: Former Smoker    Quit date: 01/25/2008    Years since quitting: 11.6  . Smokeless tobacco: Former Systems developer  . Tobacco comment: Initially quit 2001, then resumed temporarily 2010  Substance and Sexual Activity  . Alcohol use: Yes    Alcohol/week: 12.0 standard drinks    Types: 12 Cans of beer per week  . Drug use: No  . Sexual activity: Not on file  Other Topics Concern  . Not on file  Social History Narrative  . Not on  file   Social Determinants of Health   Financial Resource Strain:   . Difficulty of Paying Living Expenses: Not on file  Food Insecurity:   . Worried About Charity fundraiser in the Last Year: Not on file  . Ran Out of Food in the Last Year: Not on file  Transportation Needs:   . Lack of Transportation (Medical): Not on file  . Lack of Transportation (Non-Medical): Not on file  Physical Activity:   . Days of Exercise per Week: Not on file  . Minutes of Exercise per Session: Not on file   Stress:   . Feeling of Stress : Not on file  Social Connections:   . Frequency of Communication with Friends and Family: Not on file  . Frequency of Social Gatherings with Friends and Family: Not on file  . Attends Religious Services: Not on file  . Active Member of Clubs or Organizations: Not on file  . Attends Archivist Meetings: Not on file  . Marital Status: Not on file  Intimate Partner Violence:   . Fear of Current or Ex-Partner: Not on file  . Emotionally Abused: Not on file  . Physically Abused: Not on file  . Sexually Abused: Not on file   Family History  Problem Relation Age of Onset  . Cancer Mother        BREAST  . Heart attack Father 30  . Heart disease Father 25  . Colon cancer Paternal Grandfather 1  . Prostate cancer Neg Hx    Current Outpatient Medications on File Prior to Visit  Medication Sig  . acetaminophen (TYLENOL) 500 MG tablet Take 500 mg by mouth every 6 (six) hours as needed.  . cetirizine (ZYRTEC) 10 MG tablet Take 10 mg by mouth as needed for allergies.  . cyclobenzaprine (FLEXERIL) 10 MG tablet Take 1 tablet (10 mg total) by mouth 3 (three) times daily as needed for muscle spasms (kidney stones).  Marland Kitchen ibuprofen (ADVIL,MOTRIN) 200 MG tablet Take 200 mg by mouth every 6 (six) hours as needed.  . Multiple Vitamin (MULTIVITAMIN) tablet Take 1 tablet by mouth daily.  Marland Kitchen omeprazole (PRILOSEC) 20 MG capsule Take 1 capsule (20 mg total) by mouth daily before breakfast.  . OZEMPIC, 1 MG/DOSE, 2 MG/1.5ML SOPN Inject 1 mg into the skin once a week.   No current facility-administered medications on file prior to visit.    Review of Systems  Constitutional: Negative for activity change, appetite change, chills, diaphoresis, fatigue and fever.  HENT: Negative for congestion and hearing loss.   Eyes: Negative for visual disturbance.  Respiratory: Negative for apnea, cough, chest tightness, shortness of breath and wheezing.   Cardiovascular: Negative  for chest pain, palpitations and leg swelling.  Gastrointestinal: Negative for abdominal pain, anal bleeding, blood in stool, constipation, diarrhea, nausea and vomiting.  Endocrine: Negative for cold intolerance.  Genitourinary: Negative for difficulty urinating, dysuria, frequency and hematuria.  Musculoskeletal: Negative for arthralgias, back pain and neck pain.  Skin: Negative for rash.  Allergic/Immunologic: Negative for environmental allergies.  Neurological: Negative for dizziness, weakness, light-headedness, numbness and headaches.  Hematological: Negative for adenopathy.  Psychiatric/Behavioral: Negative for behavioral problems, dysphoric mood and sleep disturbance. The patient is not nervous/anxious.    Per HPI unless specifically indicated above     Objective:    BP 134/86 (BP Location: Left Arm, Cuff Size: Normal)   Pulse 61   Temp 97.7 F (36.5 C) (Temporal)   Resp 16   Ht  6\' 4"  (1.93 m)   Wt 243 lb 9.6 oz (110.5 kg)   SpO2 98%   BMI 29.65 kg/m   Wt Readings from Last 3 Encounters:  09/18/19 243 lb 9.6 oz (110.5 kg)  03/29/19 239 lb (108.4 kg)  09/13/18 239 lb (108.4 kg)    Physical Exam Vitals and nursing note reviewed.  Constitutional:      General: He is not in acute distress.    Appearance: He is well-developed. He is not diaphoretic.     Comments: Well-appearing, comfortable, cooperative  HENT:     Head: Normocephalic and atraumatic.  Eyes:     General:        Right eye: No discharge.        Left eye: No discharge.     Conjunctiva/sclera: Conjunctivae normal.     Pupils: Pupils are equal, round, and reactive to light.  Neck:     Thyroid: No thyromegaly.     Vascular: No carotid bruit.  Cardiovascular:     Rate and Rhythm: Normal rate and regular rhythm.     Heart sounds: Normal heart sounds. No murmur heard.   Pulmonary:     Effort: Pulmonary effort is normal. No respiratory distress.     Breath sounds: Normal breath sounds. No wheezing or  rales.  Abdominal:     General: Bowel sounds are normal. There is no distension.     Palpations: Abdomen is soft. There is no mass.     Tenderness: There is no abdominal tenderness.  Musculoskeletal:        General: No tenderness. Normal range of motion.     Cervical back: Normal range of motion and neck supple.     Comments: Upper / Lower Extremities: - Normal muscle tone, strength bilateral upper extremities 5/5, lower extremities 5/5  Lymphadenopathy:     Cervical: No cervical adenopathy.  Skin:    General: Skin is warm and dry.     Findings: No erythema or rash.  Neurological:     Mental Status: He is alert and oriented to person, place, and time.     Comments: Distal sensation intact to light touch all extremities  Psychiatric:        Behavior: Behavior normal.     Comments: Well groomed, good eye contact, normal speech and thoughts      Diabetic Foot Exam - Simple   Simple Foot Form Diabetic Foot exam was performed with the following findings: Yes 09/18/2019  8:35 AM  Visual Inspection No deformities, no ulcerations, no other skin breakdown bilaterally: Yes Sensation Testing Intact to touch and monofilament testing bilaterally: Yes Pulse Check Posterior Tibialis and Dorsalis pulse intact bilaterally: Yes Comments      Chemistry      Component Value Date/Time   NA 141 09/13/2019 0828   NA 142 04/21/2015 0812   K 4.0 09/13/2019 0828   CL 105 09/13/2019 0828   CO2 27 09/13/2019 0828   BUN 14 09/13/2019 0828   BUN 19 04/21/2015 0812   CREATININE 1.02 09/13/2019 0828      Component Value Date/Time   CALCIUM 9.3 09/13/2019 0828   ALKPHOS 69 04/21/2015 0812   AST 68 (H) 09/13/2019 0828   ALT 150 (H) 09/13/2019 0828   BILITOT 1.0 09/13/2019 0828   BILITOT 0.8 04/21/2015 0812     Recent Labs    03/29/19 0814 09/13/19 0828  HGBA1C 5.6 5.8*   Lipid Panel     Component Value Date/Time   CHOL  221 (H) 09/13/2019 0828   CHOL 174 04/21/2015 0812   TRIG 188 (H)  09/13/2019 0828   HDL 45 09/13/2019 0828   HDL 47 04/21/2015 0812   CHOLHDL 4.9 09/13/2019 0828   LDLCALC 144 (H) 09/13/2019 0828   LABVLDL 55 (H) 04/21/2015 0812   CBC Latest Ref Rng & Units 09/13/2019 09/06/2018 08/30/2017  WBC 3.8 - 10.8 Thousand/uL 7.2 7.1 6.6  Hemoglobin 13.2 - 17.1 g/dL 15.1 15.1 14.7  Hematocrit 38 - 50 % 42.9 44.0 43.1  Platelets 140 - 400 Thousand/uL 162 156 156     Results for orders placed or performed in visit on 09/18/19  POCT UA - Microalbumin  Result Value Ref Range   Microalbumin Ur, POC 0 mg/L   I have personally reviewed the radiology report from [place] 09/29/09   Narrative & Impression  * PRIOR REPORT IMPORTED FROM AN EXTERNAL SYSTEM *   PRIOR REPORT IMPORTED FROM THE SYNGO [place] EXAM:    RUQ abd pain  epigastric pain  COMMENTS:   PROCEDURE:     [place]  - US ABDOMEN GENERAL SURVEY  - Sep 29 2009  8:10AM   RESULT:     The liver is hyperechogenic, consistent with fatty  infiltration.  The pancreas is not visualized adequately for evaluation on this exam. The  spleen is mildly enlarged and measures 13.76 cm at maximum AP diameter.  The  spleen measured 12.3 cm at maximum AP diameter on the prior exam of  09/08/2006. The abdominal aorta and inferior vena cava show no significant  abnormalities. No gallstones are seen. There is no thickening of the  gallbladder wall. The common bile duct measures 4.1 mm in diameter which  is  within normal limits. The kidneys show no hydronephrosis. There is a  questionable nonobstructive tiny stone in the left kidney. No ascites is  seen.   IMPRESSION:  1. No gallstones are identified.  2. The pancreas is not visualized adequately for evaluation on this exam.  3. Probable fatty infiltration of the liver.  4. Possible nonobstructive left renal stone.   Thank you for the opportunity to contribute to the care of your patient.         Assessment & Plan:   Problem List Items  Addressed This Visit    Type 2 diabetes mellitus with other specified complication ([place])    DM A1c controlled at 5.8 On lifestyle and GLP1 Some weight loss, and gain again No Hypoglycemia and RESOLVED Hyperglycemia. Complications - hyperlipidemia, GERD - increases risk of future cardiovascular complications   Plan:  Continue Ozempic 1mg  weekly - will order new pen when he needs refill. Should be changed to 4mg /68mL for the 1mg  dosing pen now, 4 doses per pen, = 3 pens for 90 day supply - Encourage improved lifestyle - low carb, low sugar diet, reduce portion size, continue improving restart regular exercise - Not on ACEi/ARB - urine micro negative today, 0 Defer statin due to elevated LFTs for now - DM Eye updated 07/2019 DM Foot exam done      Relevant Orders   POCT UA - Microalbumin (Completed)   Overweight (BMI 25.0-29.9)   Hyperlipidemia associated with type 2 diabetes mellitus (HCC)    Elevated lipids, some weight gain Last lipid panel 06/9483 Complication hepatic steatosis, prior Liver US 2011  The 10-year ASCVD risk score Mikey Bussing DC Jr., et al., 2013) is: 9.1%   Plan: 1. Discussed ASCVD risk and DM -  offered statin - but we agree to pause for now while we work up his elevated LFTs 2. Encourage improved lifestyle - low carb/cholesterol, reduce portion size, continue improving regular exercise      Elevated LFTs    Prior trend over past 10 years with similar pattern elevated LFTs Related to alcohol and weight and cholesterol Had improved in past with lifestyle Last Liver US 2011 had hepatic steatosis  Advice on reduce alcohol, weight loss, lifestyle, control cholesterol - hold on new cholesterol med for now, repeat labs in 3 months, consider RUQ US liver imaging vs GI refer if indicated      Elevated BP without diagnosis of hypertension    Mild elevated DBP, majorly improve on recheck Chart review several 90s  Check BP at home if able - needs to check outside  readings Continue lifestyle, counseling Follow-up  Goal for wt loss improve BP       Other Visit Diagnoses    Annual physical exam    -  Primary   Needs flu shot       Relevant Orders   Flu Vaccine QUAD 36+ mos IM (Completed)   Need for 23-polyvalent pneumococcal polysaccharide vaccine       Relevant Orders   Pneumococcal polysaccharide vaccine 23-valent greater than or equal to 2yo subcutaneous/IM (Completed)   Screening for colon cancer       Relevant Orders   Ambulatory referral to Gastroenterology   Hepatic steatosis          Updated Health Maintenance information - Referral to GI for Colonoscopy - Flu vaccine today - Pneumovax23 today, next due age 50+ for routine series - Offer Shingrix, can do series when ready Reviewed recent lab results with patient Encouraged improvement to lifestyle with diet and exercise - Goal of weight loss  Orders Placed This Encounter  Procedures  . Flu Vaccine QUAD 36+ mos IM  . Pneumococcal polysaccharide vaccine 23-valent greater than or equal to 2yo subcutaneous/IM  . Ambulatory referral to Gastroenterology    Referral Priority:   Routine    Referral Type:   Consultation    Referral Reason:   Specialty Services Required    Number of Visits Requested:   1  . POCT UA - Microalbumin      No orders of the defined types were placed in this encounter.    Follow up plan: Return in about 3 months (around 12/19/2019) for 3 month fasting lab only then 1 week later follow-up Liver LFTs, BP, weight.  Future orders Lipid Hepatic Fxn Panel, Hep C ab, GGT ordered for 12/26/19  Nobie Putnam, [facility] 09/18/2019, 8:09 AM

## 2019-09-18 NOTE — Assessment & Plan Note (Addendum)
Elevated lipids, some weight gain Last lipid panel 06/7207 Complication hepatic steatosis, prior Liver US 2011  The 10-year ASCVD risk score Isaiah Doyle DC Jr., et al., 2013) is: 9.1%   Plan: 1. Discussed ASCVD risk and DM - offered statin - but we agree to pause for now while we work up his elevated LFTs 2. Encourage improved lifestyle - low carb/cholesterol, reduce portion size, continue improving regular exercise

## 2019-09-18 NOTE — Assessment & Plan Note (Signed)
Prior trend over past 10 years with similar pattern elevated LFTs Related to alcohol and weight and cholesterol Had improved in past with lifestyle Last Liver US 2011 had hepatic steatosis  Advice on reduce alcohol, weight loss, lifestyle, control cholesterol - hold on new cholesterol med for now, repeat labs in 3 months, consider RUQ US liver imaging vs GI refer if indicated

## 2019-09-18 NOTE — Assessment & Plan Note (Signed)
Mild elevated DBP, majorly improve on recheck Chart review several 90s  Check BP at home if able - needs to check outside readings Continue lifestyle, counseling Follow-up  Goal for wt loss improve BP

## 2019-09-23 DIAGNOSIS — M25512 Pain in left shoulder: Secondary | ICD-10-CM | POA: Diagnosis not present

## 2019-09-23 DIAGNOSIS — M545 Low back pain: Secondary | ICD-10-CM | POA: Diagnosis not present

## 2019-09-23 DIAGNOSIS — M5413 Radiculopathy, cervicothoracic region: Secondary | ICD-10-CM | POA: Diagnosis not present

## 2019-09-23 DIAGNOSIS — M542 Cervicalgia: Secondary | ICD-10-CM | POA: Diagnosis not present

## 2019-09-29 ENCOUNTER — Other Ambulatory Visit: Payer: Self-pay | Admitting: Family Medicine

## 2019-09-29 DIAGNOSIS — E1169 Type 2 diabetes mellitus with other specified complication: Secondary | ICD-10-CM

## 2019-09-29 NOTE — Telephone Encounter (Signed)
Requested Prescriptions  Pending Prescriptions Disp Refills  . OZEMPIC, 1 MG/DOSE, 2 MG/1.5ML SOPN [Pharmacy Med Name: OZEMPIC 1 MG/DOSE (2 MG/1.5ML)] 9 mL 1    Sig: INJECT 1 MG INTO THE SKIN ONCE A WEEK.     Endocrinology:  Diabetes - GLP-1 Receptor Agonists Passed - 09/29/2019 12:49 AM      Passed - HBA1C is between 0 and 7.9 and within 180 days    Hgb A1c MFr Bld  Date Value Ref Range Status  09/13/2019 5.8 (H) <5.7 % of total Hgb Final    Comment:    For someone without known diabetes, a hemoglobin  A1c value between 5.7% and 6.4% is consistent with prediabetes and should be confirmed with a  follow-up test. . For someone with known diabetes, a value <7% indicates that their diabetes is well controlled. A1c targets should be individualized based on duration of diabetes, age, comorbid conditions, and other considerations. . This assay result is consistent with an increased risk of diabetes. . Currently, no consensus exists regarding use of hemoglobin A1c for diagnosis of diabetes for children. Renella Cunas - Valid encounter within last 6 months    Recent Outpatient Visits          1 week ago Annual physical exam   [place], DO   6 months ago Type 2 diabetes mellitus with other specified complication, without long-term current use of insulin [facility])   [place], DO   1 year ago Annual physical exam   [facility] Olin Hauser, DO   1 year ago Type 2 diabetes mellitus with other specified complication, without long-term current use of insulin [facility])   [facility] Olin Hauser, DO   1 year ago Influenza A   [place], DO      Future Appointments            In 3 months Parks Ranger, Devonne Doughty, DO [facility], [facility]

## 2019-10-07 ENCOUNTER — Telehealth (INDEPENDENT_AMBULATORY_CARE_PROVIDER_SITE_OTHER): Payer: Self-pay | Admitting: Gastroenterology

## 2019-10-07 ENCOUNTER — Other Ambulatory Visit: Payer: Self-pay

## 2019-10-07 DIAGNOSIS — Z1211 Encounter for screening for malignant neoplasm of colon: Secondary | ICD-10-CM

## 2019-10-07 MED ORDER — PEG 3350-KCL-NA BICARB-NACL 420 G PO SOLR: 4000.0000 mL | Freq: Once | ORAL | 0 refills | Status: AC

## 2019-10-07 NOTE — Progress Notes (Signed)
Gastroenterology Pre-Procedure Review  Request Date: Friday 11/01/19 Requesting Physician: Dr. Allen Norris  PATIENT REVIEW QUESTIONS: The patient responded to the following health history questions as indicated:    1. Are you having any GI issues? life time of IBS 2. Do you have a personal history of Polyps? no 3. Do you have a family history of Colon Cancer or Polyps? yes (Paternal grandfather had colon cancer at the age of 48) 58. Diabetes Mellitus? yes (takes ozempic) 5. Joint replacements in the past 12 months?no 6. Major health problems in the past 3 months?no 7. Any artificial heart valves, MVP, or defibrillator?no    MEDICATIONS & ALLERGIES:    Patient reports the following regarding taking any anticoagulation/antiplatelet therapy:   Plavix, Coumadin, Eliquis, Xarelto, Lovenox, Pradaxa, Brilinta, or Effient? no Aspirin? no  Patient confirms/reports the following medications:  Current Outpatient Medications  Medication Sig Dispense Refill  . acetaminophen (TYLENOL) 500 MG tablet Take 500 mg by mouth every 6 (six) hours as needed.    . cetirizine (ZYRTEC) 10 MG tablet Take 10 mg by mouth as needed for allergies.    Marland Kitchen ibuprofen (ADVIL,MOTRIN) 200 MG tablet Take 200 mg by mouth every 6 (six) hours as needed.    . Multiple Vitamin (MULTIVITAMIN) tablet Take 1 tablet by mouth daily.    Marland Kitchen omeprazole (PRILOSEC) 20 MG capsule Take 1 capsule (20 mg total) by mouth daily before breakfast. 90 capsule 1  . OZEMPIC, 1 MG/DOSE, 2 MG/1.5ML SOPN INJECT 1 MG INTO THE SKIN ONCE A WEEK. 9 mL 1  . cyclobenzaprine (FLEXERIL) 10 MG tablet Take 1 tablet (10 mg total) by mouth 3 (three) times daily as needed for muscle spasms (kidney stones). (Patient not taking: Reported on 10/07/2019) 30 tablet 0  . polyethylene glycol-electrolytes (NULYTELY) 420 g solution Take 4,000 mLs by mouth once for 1 dose. Follow the preparation instructions for Nulytely bowel prep.  Drink 8 oz every 30 minutes until entire contents  have been completed. Do not eat or drink anything 4 hours prior to colonoscopy. 4000 mL 0   No current facility-administered medications for this visit.    Patient confirms/reports the following allergies:  Allergies  Allergen Reactions  . Erythromycin Nausea And Vomiting    Orders Placed This Encounter  Procedures  . Procedural/ Surgical Case Request: COLONOSCOPY WITH PROPOFOL    Standing Status:   Standing    Number of Occurrences:   1    Order Specific Question:   Pre-op diagnosis    Answer:   screening colonoscopy    Order Specific Question:   CPT Code    Answer:   60630    AUTHORIZATION INFORMATION Primary Insurance: 1D#: Group #:  Secondary Insurance: 1D#: Group #:  SCHEDULE INFORMATION: Date: Friday 11/01/19 Time: Location:[facility]

## 2019-10-13 ENCOUNTER — Other Ambulatory Visit: Payer: Self-pay | Admitting: Family Medicine

## 2019-10-13 DIAGNOSIS — K219 Gastro-esophageal reflux disease without esophagitis: Secondary | ICD-10-CM

## 2019-10-13 NOTE — Telephone Encounter (Signed)
Requested Prescriptions  Pending Prescriptions Disp Refills  . omeprazole (PRILOSEC) 20 MG capsule [Pharmacy Med Name: OMEPRAZOLE DR 20 MG CAPSULE] 90 capsule 3    Sig: TAKE 1 CAPSULE (20 MG TOTAL) BY MOUTH DAILY BEFORE BREAKFAST.     Gastroenterology: Proton Pump Inhibitors Passed - 10/13/2019 12:52 AM      Passed - Valid encounter within last 12 months    Recent Outpatient Visits          3 weeks ago Annual physical exam   [place], DO   6 months ago Type 2 diabetes mellitus with other specified complication, without long-term current use of insulin ([place])   [place], DO   1 year ago Annual physical exam   [facility] Olin Hauser, DO   1 year ago Type 2 diabetes mellitus with other specified complication, without long-term current use of insulin [facility])   [facility] Olin Hauser, DO   1 year ago Influenza A   [place], DO      Future Appointments            In 2 months Parks Ranger, Devonne Doughty, [facility], [facility]

## 2019-10-14 DIAGNOSIS — M5413 Radiculopathy, cervicothoracic region: Secondary | ICD-10-CM | POA: Diagnosis not present

## 2019-10-14 DIAGNOSIS — M25512 Pain in left shoulder: Secondary | ICD-10-CM | POA: Diagnosis not present

## 2019-10-14 DIAGNOSIS — M542 Cervicalgia: Secondary | ICD-10-CM | POA: Diagnosis not present

## 2019-10-14 DIAGNOSIS — M545 Low back pain: Secondary | ICD-10-CM | POA: Diagnosis not present

## 2019-10-30 ENCOUNTER — Other Ambulatory Visit
Admission: RE | Admit: 2019-10-30 | Discharge: 2019-10-30 | Disposition: A | Discharge status: Home / Self Care | Payer: BC Managed Care – PPO | Source: Ambulatory Visit | Attending: Gastroenterology | Admitting: Gastroenterology

## 2019-10-30 ENCOUNTER — Other Ambulatory Visit: Payer: Self-pay

## 2019-10-30 DIAGNOSIS — Z01818 Encounter for other preprocedural examination: Secondary | ICD-10-CM | POA: Insufficient documentation

## 2019-10-30 DIAGNOSIS — Z20822 Contact with and (suspected) exposure to covid-19: Secondary | ICD-10-CM | POA: Insufficient documentation

## 2019-10-30 LAB — SARS CORONAVIRUS 2 (TAT 6-24 HRS): SARS Coronavirus 2: NEGATIVE

## 2019-11-01 ENCOUNTER — Ambulatory Visit
Admission: RE | Admit: 2019-11-01 | Discharge: 2019-11-01 | Disposition: A | Discharge status: Home / Self Care | Payer: BC Managed Care – PPO | Attending: Gastroenterology | Admitting: Gastroenterology

## 2019-11-01 ENCOUNTER — Ambulatory Visit: Payer: BC Managed Care – PPO | Admitting: Certified Registered Nurse Anesthetist

## 2019-11-01 ENCOUNTER — Encounter: Payer: Self-pay | Admitting: Gastroenterology

## 2019-11-01 ENCOUNTER — Encounter
Admission: RE | Disposition: A | Discharge status: Home / Self Care | Payer: Self-pay | Source: Home / Self Care | Attending: Gastroenterology

## 2019-11-01 DIAGNOSIS — Z87891 Personal history of nicotine dependence: Secondary | ICD-10-CM | POA: Diagnosis not present

## 2019-11-01 DIAGNOSIS — E1122 Type 2 diabetes mellitus with diabetic chronic kidney disease: Secondary | ICD-10-CM | POA: Diagnosis not present

## 2019-11-01 DIAGNOSIS — E119 Type 2 diabetes mellitus without complications: Secondary | ICD-10-CM | POA: Diagnosis not present

## 2019-11-01 DIAGNOSIS — D125 Benign neoplasm of sigmoid colon: Secondary | ICD-10-CM | POA: Insufficient documentation

## 2019-11-01 DIAGNOSIS — Z1211 Encounter for screening for malignant neoplasm of colon: Secondary | ICD-10-CM

## 2019-11-01 DIAGNOSIS — K219 Gastro-esophageal reflux disease without esophagitis: Secondary | ICD-10-CM | POA: Diagnosis not present

## 2019-11-01 DIAGNOSIS — K635 Polyp of colon: Secondary | ICD-10-CM | POA: Diagnosis not present

## 2019-11-01 DIAGNOSIS — Z79899 Other long term (current) drug therapy: Secondary | ICD-10-CM | POA: Diagnosis not present

## 2019-11-01 DIAGNOSIS — D124 Benign neoplasm of descending colon: Secondary | ICD-10-CM | POA: Insufficient documentation

## 2019-11-01 DIAGNOSIS — N189 Chronic kidney disease, unspecified: Secondary | ICD-10-CM | POA: Diagnosis not present

## 2019-11-01 HISTORY — PX: COLONOSCOPY WITH PROPOFOL: SHX5780

## 2019-11-01 LAB — GLUCOSE, CAPILLARY: Glucose-Capillary: 120 mg/dL — ABNORMAL HIGH (ref 70–99)

## 2019-11-01 SURGERY — COLONOSCOPY WITH PROPOFOL
Anesthesia: General

## 2019-11-01 MED ORDER — LIDOCAINE HCL (PF) 2 % IJ SOLN
INTRAMUSCULAR | Status: AC
  Filled 2019-11-01: qty 5

## 2019-11-01 MED ORDER — SPOT INK MARKER SYRINGE KIT
PACK | SUBMUCOSAL | Status: DC | PRN
  Administered 2019-11-01: 3 mL via SUBMUCOSAL

## 2019-11-01 MED ORDER — PROPOFOL 500 MG/50ML IV EMUL
INTRAVENOUS | Status: DC | PRN
  Administered 2019-11-01: 150 ug/kg/min via INTRAVENOUS

## 2019-11-01 MED ORDER — PROPOFOL 10 MG/ML IV BOLUS
INTRAVENOUS | Status: DC | PRN
  Administered 2019-11-01: 60 mg via INTRAVENOUS

## 2019-11-01 MED ORDER — LIDOCAINE HCL (CARDIAC) PF 100 MG/5ML IV SOSY
PREFILLED_SYRINGE | INTRAVENOUS | Status: DC | PRN
  Administered 2019-11-01: 50 mg via INTRAVENOUS

## 2019-11-01 MED ORDER — SODIUM CHLORIDE 0.9 % IV SOLN: INTRAVENOUS | Status: DC

## 2019-11-01 MED ORDER — PROPOFOL 500 MG/50ML IV EMUL
INTRAVENOUS | Status: AC
  Filled 2019-11-01: qty 50

## 2019-11-01 NOTE — H&P (Signed)
Isaiah Antigua, MD [address] [place], [place], [place], [address] [id number], [place], [place], [place], [address] Phone: [phone]  Fax: [phone]  Primary Care Physician:  Olin Hauser, DO   Pre-Procedure History & Physical: HPI:  Isaiah Doyle. is a 49 y.o. male is here for a colonoscopy.   Past Medical History:  Diagnosis Date  . Allergy   . Diabetes mellitus without complication ([place])   . GERD (gastroesophageal reflux disease)     Past Surgical History:  Procedure Laterality Date  . COLONOSCOPY    . ESOPHAGOGASTRODUODENOSCOPY (EGD) WITH PROPOFOL N/A 11/13/2014   Procedure: ESOPHAGOGASTRODUODENOSCOPY (EGD) WITH PROPOFOL;  Surgeon: Hulen Luster, MD;  Location: [facility] ENDOSCOPY;  Service: Gastroenterology;  Laterality: N/A;  . TONSILLECTOMY    . WRIST MASS EXCISION      Prior to Admission medications   Medication Sig Start Date End Date Taking? Authorizing Provider  acetaminophen (TYLENOL) 500 MG tablet Take 500 mg by mouth every 6 (six) hours as needed.   Yes [provider]  cetirizine (ZYRTEC) 10 MG tablet Take 10 mg by mouth as needed for allergies.   Yes [provider]  ibuprofen (ADVIL,MOTRIN) 200 MG tablet Take 200 mg by mouth every 6 (six) hours as needed.   Yes [provider]  Multiple Vitamin (MULTIVITAMIN) tablet Take 1 tablet by mouth daily.   Yes [provider]  omeprazole (PRILOSEC) 20 MG capsule TAKE 1 CAPSULE (20 MG TOTAL) BY MOUTH DAILY BEFORE BREAKFAST. 10/13/19  Yes Karamalegos, Devonne Doughty, DO  OZEMPIC, 1 MG/DOSE, 2 MG/1.5ML SOPN INJECT 1 MG INTO THE SKIN ONCE A WEEK. 09/29/19  Yes Karamalegos, Devonne Doughty, DO  cyclobenzaprine (FLEXERIL) 10 MG tablet Take 1 tablet (10 mg total) by mouth 3 (three) times daily as needed for muscle spasms (kidney stones). Patient not taking: Reported on 10/07/2019 05/29/17   Olin Hauser, DO    Allergies as of 10/07/2019 - Review Complete 09/18/2019   Allergen Reaction Noted  . Erythromycin Nausea And Vomiting 11/10/2014    Family History  Problem Relation Age of Onset  . Cancer Mother        BREAST  . Heart attack Father 74  . Heart disease Father 59  . Colon cancer Paternal Grandfather 25  . Prostate cancer Neg Hx     Social History   Socioeconomic History  . Marital status: Married    Spouse name: Not on file  . Number of children: Not on file  . Years of education: Not on file  . Highest education level: Not on file  Occupational History  . Not on file  Tobacco Use  . Smoking status: Former Smoker    Quit date: 01/25/2008    Years since quitting: 11.7  . Smokeless tobacco: Former Systems developer  . Tobacco comment: Initially quit 2001, then resumed temporarily 2010  Substance and Sexual Activity  . Alcohol use: Yes    Alcohol/week: 12.0 standard drinks    Types: 12 Cans of beer per week  . Drug use: No  . Sexual activity: Not on file  Other Topics Concern  . Not on file  Social History Narrative  . Not on file   Social Determinants of Health   Financial Resource Strain:   . Difficulty of Paying Living Expenses: Not on file  Food Insecurity:   . Worried About Charity fundraiser in the Last Year: Not on file  . Ran Out of Food in the Last  Year: Not on file  Transportation Needs:   . Lack of Transportation (Medical): Not on file  . Lack of Transportation (Non-Medical): Not on file  Physical Activity:   . Days of Exercise per Week: Not on file  . Minutes of Exercise per Session: Not on file  Stress:   . Feeling of Stress : Not on file  Social Connections:   . Frequency of Communication with Friends and Family: Not on file  . Frequency of Social Gatherings with Friends and Family: Not on file  . Attends Religious Services: Not on file  . Active Member of Clubs or Organizations: Not on file  . Attends Archivist Meetings: Not on file  . Marital Status: Not on file  Intimate Partner Violence:   . Fear  of Current or Ex-Partner: Not on file  . Emotionally Abused: Not on file  . Physically Abused: Not on file  . Sexually Abused: Not on file    Review of Systems: See HPI, otherwise negative ROS  Physical Exam: BP (!) 150/97   Pulse 73   Temp (!) 97.5 F (36.4 C) (Temporal)   Resp 16   Ht 6\' 4"  (1.93 m)   Wt 107 kg   SpO2 99%   BMI 28.73 kg/m  General:   Alert,  pleasant and cooperative in NAD Head:  Normocephalic and atraumatic. Neck:  Supple; no masses or thyromegaly. Lungs:  Clear throughout to auscultation, normal respiratory effort.    Heart:  +S1, +S2, Regular rate and rhythm, No edema. Abdomen:  Soft, nontender and nondistended. Normal bowel sounds, without guarding, and without rebound.   Neurologic:  Alert and  oriented x4;  grossly normal neurologically.  Impression/Plan: [name] is here for a colonoscopy to be performed for average risk screening.  Risks, benefits, limitations, and alternatives regarding  colonoscopy have been reviewed with the patient.  Questions have been answered.  All parties agreeable.   Virgel Manifold, MD  11/01/2019, 9:06 AM

## 2019-11-01 NOTE — Anesthesia Preprocedure Evaluation (Signed)
Anesthesia Evaluation  Patient identified by MRN, date of birth, ID band Patient awake    Reviewed: Allergy & Precautions, H&P , NPO status , Patient's Chart, lab work & pertinent test results  History of Anesthesia Complications Negative for: history of anesthetic complications  Airway Mallampati: II  TM Distance: >3 FB Neck ROM: full    Dental no notable dental hx. (+) Teeth Intact   Pulmonary neg shortness of breath, neg recent URI, former smoker,    Pulmonary exam normal breath sounds clear to auscultation       Cardiovascular Exercise Tolerance: Good (-) hypertension(-) angina(-) Past MI and (-) DOE negative cardio ROS Normal cardiovascular exam(-) dysrhythmias (-) Valvular Problems/Murmurs Rhythm:regular Rate:Normal     Neuro/Psych negative neurological ROS  negative psych ROS   GI/Hepatic Neg liver ROS, GERD  ,  Endo/Other  diabetes, Well Controlled  Renal/GU Renal disease  negative genitourinary   Musculoskeletal   Abdominal   Peds  Hematology negative hematology ROS (+)   Anesthesia Other Findings Past Medical History:   Diabetes mellitus without complication (HCC)                 Chronic kidney disease                                      Past Surgical History:   TONSILLECTOMY                                                 WRIST MASS EXCISION                                           COLONOSCOPY                                                     Reproductive/Obstetrics negative OB ROS                             Anesthesia Physical  Anesthesia Plan  ASA: III  Anesthesia Plan: General   Post-op Pain Management:    Induction: Intravenous  PONV Risk Score and Plan: 2 and Propofol infusion and TIVA  Airway Management Planned: Natural Airway and Nasal Cannula  Additional Equipment:   Intra-op Plan:   Post-operative Plan:   Informed Consent: I have reviewed  the patients History and Physical, chart, labs and discussed the procedure including the risks, benefits and alternatives for the proposed anesthesia with the patient or authorized representative who has indicated his/her understanding and acceptance.     Dental Advisory Given  Plan Discussed with: Anesthesiologist, CRNA and Surgeon  Anesthesia Plan Comments: (Requested that patient remove contact lenses to prevent corneal abrasion  )        Anesthesia Quick Evaluation

## 2019-11-01 NOTE — Transfer of Care (Signed)
Immediate Anesthesia Transfer of Care Note  Patient: Isaiah Doyle.  Procedure(s) Performed: COLONOSCOPY WITH PROPOFOL (N/A )  Patient Location: PACU  Anesthesia Type:General  Level of Consciousness: awake, alert  and oriented  Airway & Oxygen Therapy: Patient Spontanous Breathing  Post-op Assessment: Report given to RN and Post -op Vital signs reviewed and stable  Post vital signs: Reviewed and stable  Last Vitals:  Vitals Value Taken Time  BP 125/76 11/01/19 1100  Temp    Pulse 74 11/01/19 1101  Resp 15 11/01/19 1101  SpO2 95 % 11/01/19 1101    Last Pain:  Vitals:   11/01/19 0847  TempSrc: Temporal         Complications: No complications documented.

## 2019-11-01 NOTE — Op Note (Signed)
[facility] Patient Name: Isaiah Doyle Procedure Date: 11/01/2019 9:53 AM MRN: [id number] Account #: [id number] Date of Birth: 1970/12/20 Admit Type: Outpatient Age: 49 Room: [facility] ENDO ROOM 4 Gender: Male Note Status: Finalized Procedure:             Colonoscopy Indications:           Screening for colorectal malignant neoplasm Providers:             Joshawa Dubin B. Bonna Gains MD, MD Referring MD:          Olin Hauser (Referring MD) Medicines:             Monitored Anesthesia Care Complications:         No immediate complications. Procedure:             Pre-Anesthesia Assessment:                        - ASA Grade Assessment: II - A patient with mild                         systemic disease.                        - Prior to the procedure, a History and Physical was                         performed, and patient medications, allergies and                         sensitivities were reviewed. The patient's tolerance                         of previous anesthesia was reviewed.                        - The risks and benefits of the procedure and the                         sedation options and risks were discussed with the                         patient. All questions were answered and informed                         consent was obtained.                        - Patient identification and proposed procedure were                         verified prior to the procedure by the physician, the                         nurse, the anesthesiologist, the anesthetist and the                         technician. The procedure was verified in the                         procedure room.  After obtaining informed consent, the colonoscope was                         passed under direct vision. Throughout the procedure,                         the patient's blood pressure, pulse, and oxygen                         saturations were  monitored continuously. The                         Colonoscope was introduced through the anus and                         advanced to the the cecum, identified by appendiceal                         orifice and ileocecal valve. The colonoscopy was                         performed with ease. The patient tolerated the                         procedure well. The quality of the bowel preparation                         was good. Findings:      The perianal and digital rectal examinations were normal.      Two flat polyps were found in the descending colon. The polyps were 10       to 12 mm in size. These polyps were removed with a cold snare. Resection       and retrieval were complete.      A 10 mm polyp was found in the descending colon. The polyp was flat.       Polypectomy was attempted, initially using a cold snare. Polyp resection       was incomplete with this device. This intervention then required a       different device and polypectomy technique. The polyp was removed with a       jumbo cold forceps. Resection and retrieval were complete. Area was       tattooed with an injection of 3 mL of Spot (carbon black).      Two flat polyps were found in the sigmoid colon. The polyps were 5 to 7       mm in size. These polyps were removed with a cold snare. Resection and       retrieval were complete.      The exam was otherwise without abnormality.      The rectum, sigmoid colon, descending colon, transverse colon, ascending       colon and cecum appeared normal.      The retroflexed view of the distal rectum and anal verge was normal and       showed no anal or rectal abnormalities. Impression:            - Two 10 to 12 mm polyps in the descending colon,  removed with a cold snare. Resected and retrieved.                        - One 10 mm polyp in the descending colon, removed                         with a jumbo cold forceps. Resected and retrieved.                          Tattooed.                        - Two 5 to 7 mm polyps in the sigmoid colon, removed                         with a cold snare. Resected and retrieved.                        - The examination was otherwise normal.                        - The rectum, sigmoid colon, descending colon,                         transverse colon, ascending colon and cecum are normal.                        - The distal rectum and anal verge are normal on                         retroflexion view. Recommendation:        - Discharge patient to home (with escort).                        - Advance diet as tolerated.                        - Continue present medications.                        - Await pathology results.                        - Repeat colonoscopy in 1 year if piecemeal                         polypectomy site is pre-cancerous, otherwise 3 years.                        - The findings and recommendations were discussed with                         the patient.                        - The findings and recommendations were discussed with                         the patient's family.                        -  Return to primary care physician as previously                         scheduled. Procedure Code(s):     --- Professional ---                        [phone], Colonoscopy, flexible; with removal of                         tumor(s), polyp(s), or other lesion(s) by snare                         technique                        45381, Colonoscopy, flexible; with directed submucosal                         injection(s), any substance                        80881, 63, Colonoscopy, flexible; with biopsy, single                         or multiple Diagnosis Code(s):     --- Professional ---                        K63.5, Polyp of colon                        Z12.11, Encounter for screening for malignant neoplasm                         of colon CPT copyright 2019 American Medical  Association. All rights reserved. The codes documented in this report are preliminary and upon coder review may  be revised to meet current compliance requirements.  Vonda Antigua, MD Margretta Sidle B. Bonna Gains MD, MD 11/01/2019 11:04:11 AM This report has been signed electronically. Number of Addenda: 0 Note Initiated On: 11/01/2019 9:53 AM Scope Withdrawal Time: 0 hours 47 minutes 7 seconds  Total Procedure Duration: 0 hours 52 minutes 32 seconds  Estimated Blood Loss:  Estimated blood loss: none.      [facility]

## 2019-11-02 NOTE — Anesthesia Postprocedure Evaluation (Signed)
Anesthesia Post Note  Patient: Isaiah Doyle.  Procedure(s) Performed: COLONOSCOPY WITH PROPOFOL (N/A )  Patient location during evaluation: Endoscopy Anesthesia Type: General Level of consciousness: awake and alert Pain management: pain level controlled Vital Signs Assessment: post-procedure vital signs reviewed and stable Respiratory status: spontaneous breathing, nonlabored ventilation, respiratory function stable and patient connected to nasal cannula oxygen Cardiovascular status: blood pressure returned to baseline and stable Postop Assessment: no apparent nausea or vomiting Anesthetic complications: no   No complications documented.   Last Vitals:  Vitals:   11/01/19 1120 11/01/19 1130  BP: 115/86 (!) 118/94  Pulse: 62 61  Resp: 20 14  Temp:    SpO2: 100% 100%    Last Pain:  Vitals:   11/01/19 0847  TempSrc: Temporal                 Martha Clan

## 2019-11-04 ENCOUNTER — Encounter: Payer: Self-pay | Admitting: Gastroenterology

## 2019-11-04 LAB — SURGICAL PATHOLOGY

## 2019-11-07 ENCOUNTER — Encounter: Payer: Self-pay | Admitting: Gastroenterology

## 2019-11-18 DIAGNOSIS — M25512 Pain in left shoulder: Secondary | ICD-10-CM | POA: Diagnosis not present

## 2019-11-18 DIAGNOSIS — M5413 Radiculopathy, cervicothoracic region: Secondary | ICD-10-CM | POA: Diagnosis not present

## 2019-11-18 DIAGNOSIS — M545 Low back pain, unspecified: Secondary | ICD-10-CM | POA: Diagnosis not present

## 2019-11-18 DIAGNOSIS — M542 Cervicalgia: Secondary | ICD-10-CM | POA: Diagnosis not present

## 2019-11-28 DIAGNOSIS — M25512 Pain in left shoulder: Secondary | ICD-10-CM | POA: Diagnosis not present

## 2019-11-28 DIAGNOSIS — M545 Low back pain, unspecified: Secondary | ICD-10-CM | POA: Diagnosis not present

## 2019-11-28 DIAGNOSIS — M542 Cervicalgia: Secondary | ICD-10-CM | POA: Diagnosis not present

## 2019-11-28 DIAGNOSIS — M5413 Radiculopathy, cervicothoracic region: Secondary | ICD-10-CM | POA: Diagnosis not present

## 2019-12-24 ENCOUNTER — Ambulatory Visit: Payer: BC Managed Care – PPO | Admitting: Family Medicine

## 2019-12-26 ENCOUNTER — Other Ambulatory Visit: Payer: BC Managed Care – PPO

## 2019-12-26 ENCOUNTER — Other Ambulatory Visit: Payer: Self-pay

## 2019-12-26 DIAGNOSIS — K760 Fatty (change of) liver, not elsewhere classified: Secondary | ICD-10-CM | POA: Diagnosis not present

## 2019-12-26 DIAGNOSIS — R7989 Other specified abnormal findings of blood chemistry: Secondary | ICD-10-CM

## 2019-12-26 DIAGNOSIS — E785 Hyperlipidemia, unspecified: Secondary | ICD-10-CM

## 2019-12-26 DIAGNOSIS — E1169 Type 2 diabetes mellitus with other specified complication: Secondary | ICD-10-CM | POA: Diagnosis not present

## 2019-12-27 LAB — LIPID PANEL
Cholesterol: 214 mg/dL — ABNORMAL HIGH (ref <200)
HDL: 45 mg/dL (ref >=40)
LDL Cholesterol (Calc): 137 mg/dL (calc) — ABNORMAL HIGH
Non-HDL Cholesterol (Calc): 169 mg/dL (calc) — ABNORMAL HIGH (ref <130)
Total CHOL/HDL Ratio: 4.8 (calc) (ref <5.0)
Triglycerides: 183 mg/dL — ABNORMAL HIGH (ref <150)

## 2019-12-27 LAB — HEPATIC FUNCTION PANEL
AG Ratio: 1.6 (calc) (ref 1.0–2.5)
ALT: 87 U/L — ABNORMAL HIGH (ref 9–46)
AST: 40 U/L — ABNORMAL HIGH (ref 10–35)
Albumin: 4.8 g/dL (ref 3.6–5.1)
Alkaline phosphatase (APISO): 68 U/L (ref 35–144)
Bilirubin, Direct: 0.1 mg/dL (ref 0.0–0.2)
Globulin: 3 g/dL (calc) (ref 1.9–3.7)
Indirect Bilirubin: 0.6 mg/dL (calc) (ref 0.2–1.2)
Total Bilirubin: 0.7 mg/dL (ref 0.2–1.2)
Total Protein: 7.8 g/dL (ref 6.1–8.1)

## 2019-12-27 LAB — HEMOGLOBIN A1C
Hgb A1c MFr Bld: 5.7 % of total Hgb — ABNORMAL HIGH (ref <5.7)
Mean Plasma Glucose: 117 (calc)
eAG (mmol/L): 6.5 (calc)

## 2019-12-27 LAB — GAMMA GT: GGT: 35 U/L (ref 3–95)

## 2019-12-27 LAB — HEPATITIS C ANTIBODY
Hepatitis C Ab: NONREACTIVE
SIGNAL TO CUT-OFF: 0.01 (ref <1.00)

## 2020-01-01 DIAGNOSIS — M5413 Radiculopathy, cervicothoracic region: Secondary | ICD-10-CM | POA: Diagnosis not present

## 2020-01-01 DIAGNOSIS — M9903 Segmental and somatic dysfunction of lumbar region: Secondary | ICD-10-CM | POA: Diagnosis not present

## 2020-01-01 DIAGNOSIS — M9904 Segmental and somatic dysfunction of sacral region: Secondary | ICD-10-CM | POA: Diagnosis not present

## 2020-01-01 DIAGNOSIS — M9901 Segmental and somatic dysfunction of cervical region: Secondary | ICD-10-CM | POA: Diagnosis not present

## 2020-01-02 ENCOUNTER — Other Ambulatory Visit: Payer: Self-pay

## 2020-01-02 ENCOUNTER — Ambulatory Visit (INDEPENDENT_AMBULATORY_CARE_PROVIDER_SITE_OTHER): Payer: BC Managed Care – PPO | Admitting: Family Medicine

## 2020-01-02 ENCOUNTER — Encounter: Payer: Self-pay | Admitting: Family Medicine

## 2020-01-02 VITALS — BP 130/86 | HR 80 | Ht 76.0 in | Wt 248.0 lb

## 2020-01-02 DIAGNOSIS — R7989 Other specified abnormal findings of blood chemistry: Secondary | ICD-10-CM

## 2020-01-02 DIAGNOSIS — R030 Elevated blood-pressure reading, without diagnosis of hypertension: Secondary | ICD-10-CM

## 2020-01-02 DIAGNOSIS — E785 Hyperlipidemia, unspecified: Secondary | ICD-10-CM

## 2020-01-02 DIAGNOSIS — E1169 Type 2 diabetes mellitus with other specified complication: Secondary | ICD-10-CM | POA: Diagnosis not present

## 2020-01-02 MED ORDER — OZEMPIC (1 MG/DOSE) 2 MG/1.5ML ~~LOC~~ SOPN: 1.0000 mg | PEN_INJECTOR | SUBCUTANEOUS | 3 refills | Status: DC

## 2020-01-02 MED ORDER — OZEMPIC (1 MG/DOSE) 4 MG/3ML ~~LOC~~ SOPN: 1.0000 mg | PEN_INJECTOR | SUBCUTANEOUS | 3 refills | Status: DC

## 2020-01-02 NOTE — Assessment & Plan Note (Signed)
DM A1c controlled at 5.7 On lifestyle and GLP1 Still goal for wt loss No Hypoglycemia and RESOLVED Hyperglycemia. Complications - hyperlipidemia, GERD - increases risk of future cardiovascular complications   Plan:  Continue Ozempic 1mg  weekly - re order, 90 day, fixed pen quantity on resubmit - Encourage improved lifestyle - low carb, low sugar diet, reduce portion size, continue improving restart regular exercise Last urine micro negative. Offer ACEi/ARB today, declines. Reconsider at next apt, has had some elevated BP recently but noto checking BP at home Defer statin due to elevated LFTs for now - DM Eye updated 07/2019

## 2020-01-02 NOTE — Assessment & Plan Note (Signed)
Last lipid panel 12/2019, LDL down from 471 to 855 Complication hepatic steatosis, prior Liver US 2011 LFTs improved, ALT still >80, not at baseline The 10-year ASCVD risk score Mikey Bussing DC Jr., et al., 2013) is: 8.3%  Plan: 1. Discussed ASCVD risk and DM - offered statin again he is interested, but opts to wait until next apt in 2022 2. Encourage improved lifestyle - low carb/cholesterol, reduce portion size, continue improving regular exercise

## 2020-01-02 NOTE — Progress Notes (Signed)
Subjective:    Patient ID: Isaiah Doyle., male    DOB: 24-Jul-1970, 49 y.o.   MRN: [id number]  Isaiah Doyle. is a 49 y.o. male presenting on 01/02/2020 for Hypertension, LFTs, and Weight Check   HPI   CHRONIC DM, Type 2: Reportsno concerns, now improving with lifestyle changes. CBGs: Avg100-120, Low>100, High< 150. Checks CBGsx 1 daily fasting AM Meds:Ozempic 1mg  weekly Off Metformin Reports good compliance. Tolerating well w/o side-effects Currentlynot on ACEi/ARB Lifestyle: Weight up - Diet (Similar to last time - high protein options, salads) - Exercise (limited exercise due to time) Denies hypoglycemia, polyuria, visual changes, numbness or tingling.  HYPERLIPIDEMIA: Some to limited improvement on Lipids in past 4 months with some lifestyle modification, still has not lost dramatic wt yet. Last lipid panel 12/2019, LDL down from 144 to 137 Not on statin Fam history of MI age 59 and 13 with father  Elevated BP without dx HTN He admitted a dizzy spell almost vertigo recently. Not checking home BP, has had some elevation before Fam history of HTN Current Meds -None Denies CP, dyspnea, HA, edema, dizziness / lightheadedness  History of Kidney Stones Recent had slight back pain but it has resolved. No actual current symptoms. He usually takes Flomax PRN none lately.  Elevated LFTs Last lab showed significant improvement with AST 40 (from 68) and  ALT now down to 87 (from 150) Drinks 2 shots most days after work, no more, he used to drink more but has reduced.  Health Maintenance: Hep C negative  Depression screen [facility] 2/9 01/02/2020 09/18/2019 09/18/2019  Decreased Interest 0 0 -  Down, Depressed, Hopeless 0 0 0  PHQ - 2 Score 0 0 0    Social History   Tobacco Use  . Smoking status: Former Smoker    Quit date: 01/25/2008    Years since quitting: 11.9  . Smokeless tobacco: Former Systems developer  . Tobacco comment: Initially quit 2001, then resumed  temporarily 2010  Substance Use Topics  . Alcohol use: Yes    Alcohol/week: 12.0 standard drinks    Types: 12 Cans of beer per week  . Drug use: No    Review of Systems Per HPI unless specifically indicated above     Objective:    BP 130/86 (BP Location: Left Arm, Cuff Size: Normal)   Pulse 80   Ht 6\' 4"  (1.93 m)   Wt 248 lb (112.5 kg)   SpO2 100%   BMI 30.19 kg/m   Wt Readings from Last 3 Encounters:  01/02/20 248 lb (112.5 kg)  11/01/19 236 lb (107 kg)  09/18/19 243 lb 9.6 oz (110.5 kg)    Physical Exam Vitals and nursing note reviewed.  Constitutional:      General: He is not in acute distress.    Appearance: He is well-developed and well-nourished. He is not diaphoretic.     Comments: Well-appearing, comfortable, cooperative  HENT:     Head: Normocephalic and atraumatic.     Mouth/Throat:     Mouth: Oropharynx is clear and moist.  Eyes:     General:        Right eye: No discharge.        Left eye: No discharge.     Conjunctiva/sclera: Conjunctivae normal.  Cardiovascular:     Rate and Rhythm: Normal rate.  Pulmonary:     Effort: Pulmonary effort is normal.  Musculoskeletal:        General: No edema.  Skin:  General: Skin is warm and dry.     Findings: No erythema or rash.  Neurological:     Mental Status: He is alert and oriented to person, place, and time.  Psychiatric:        Mood and Affect: Mood and affect normal.        Behavior: Behavior normal.     Comments: Well groomed, good eye contact, normal speech and thoughts       Results for orders placed or performed in visit on 12/26/19  Lipid panel  Result Value Ref Range   Cholesterol 214 (H) <200 mg/dL   HDL 45 > OR = 40 mg/dL   Triglycerides 183 (H) <150 mg/dL   LDL Cholesterol (Calc) 137 (H) mg/dL (calc)   Total CHOL/HDL Ratio 4.8 <5.0 (calc)   Non-HDL Cholesterol (Calc) 169 (H) <130 mg/dL (calc)  Hepatic function panel  Result Value Ref Range   Total Protein 7.8 6.1 - 8.1 g/dL    Albumin 4.8 3.6 - 5.1 g/dL   Globulin 3.0 1.9 - 3.7 g/dL (calc)   AG Ratio 1.6 1.0 - 2.5 (calc)   Total Bilirubin 0.7 0.2 - 1.2 mg/dL   Bilirubin, Direct 0.1 0.0 - 0.2 mg/dL   Indirect Bilirubin 0.6 0.2 - 1.2 mg/dL (calc)   Alkaline phosphatase (APISO) 68 35 - 144 U/L   AST 40 (H) 10 - 35 U/L   ALT 87 (H) 9 - 46 U/L  Gamma GT  Result Value Ref Range   GGT 35 3 - 95 U/L  Hepatitis C antibody  Result Value Ref Range   Hepatitis C Ab NON-REACTIVE NON-REACTI   SIGNAL TO CUT-OFF 0.01 <1.00  Hemoglobin A1c  Result Value Ref Range   Hgb A1c MFr Bld 5.7 (H) <5.7 % of total Hgb   Mean Plasma Glucose 117 (calc)   eAG (mmol/L) 6.5 (calc)      Assessment & Plan:   Problem List Items Addressed This Visit    Type 2 diabetes mellitus with other specified complication (HCC) - Primary    DM A1c controlled at 5.7 On lifestyle and GLP1 Still goal for wt loss No Hypoglycemia and RESOLVED Hyperglycemia. Complications - hyperlipidemia, GERD - increases risk of future cardiovascular complications   Plan:  Continue Ozempic 1mg  weekly - re order, 90 day, fixed pen quantity on resubmit - Encourage improved lifestyle - low carb, low sugar diet, reduce portion size, continue improving restart regular exercise Last urine micro negative. Offer ACEi/ARB today, declines. Reconsider at next apt, has had some elevated BP recently but noto checking BP at home Defer statin due to elevated LFTs for now - DM Eye updated 07/2019      Relevant Medications   OZEMPIC, 1 MG/DOSE, 4 MG/3ML SOPN   Hyperlipidemia associated with type 2 diabetes mellitus ([place])    Last lipid panel 12/2019, LDL down from 762 to 263 Complication hepatic steatosis, prior Liver US 2011 LFTs improved, ALT still >80, not at baseline The 10-year ASCVD risk score Mikey Bussing DC Jr., et al., 2013) is: 8.3%  Plan: 1. Discussed ASCVD risk and DM - offered statin again he is interested, but opts to wait until next apt in 2022 2. Encourage improved  lifestyle - low carb/cholesterol, reduce portion size, continue improving regular exercise      Relevant Medications   OZEMPIC, 1 MG/DOSE, 4 MG/3ML SOPN   Elevated LFTs    Prior trend over past 10 years with similar pattern elevated LFTs Related to alcohol and weight  and cholesterol  Improved again now, but not at baseline Last Liver US 2011 had hepatic steatosis  Advice on reduce alcohol, weight loss, lifestyle, control cholesterol - hold on new cholesterol med for now. Future reconsider RUQ abdomen imaging  Anticipate start statin next visit if agreeable      Elevated BP without diagnosis of hypertension    Elevated DBP, majorly improve on recheck  Check BP at home if able - needs to check outside readings again reviewed. Continue lifestyle, counseling Follow-up  Goal for wt loss improve BP  Next apt if still elevated BP - will offer ACEi/ARB as well as DM patient         Meds ordered this encounter  Medications  . OZEMPIC, 1 MG/DOSE, 2 MG/1.5ML SOPN    Sig: Inject 1 mg into the skin once a week.    Dispense:  9 mL    Refill:  3    Refills added      Follow up plan: Return in about 3 months (around 04/01/2020) for 1 month nurse visit [facility] - 3 month f/u DM A1c needs Urine POC Microalbumin - start Statin/ARB?Marland Kitchen  Nobie Putnam, [facility] 01/02/2020, 8:57 AM

## 2020-01-02 NOTE — Patient Instructions (Addendum)
Thank you for coming to the office today.  Recommend calling GI specialist about 2-3 months BEFORE you are due for next repeat 1 year colonoscopy in October 2022  Keep on lower amount of alcohol  Goal to improve lifestyle weight management  Consider new medications  Losartan (previous older medicine Lisinopril - don't use this one as much) - these are BP medications that protect kidneys in diabetic patients to prevent protein / sugar / pressure damage to kidneys. - would be very low dose to help BP and protect kidneys  You are at increased risk of future Cardiovascular complications such as Heart Attack or Stroke from an artery blockage due to abnormal cholesterol and/or risk factors. - As discussed, Statin Cholesterol pills both can both LOWER cholesterol and REDUCE this future risk of heart attack and stroke Rosuvastatin (generic Crestor) or Atorvastatin (Generic Lipitor) pill once at bedtime every night  If you develop mild aches or pains in muscle or joint that does NOT improve or go away after first 3-4 weeks then this may require [place] to adjust the dose. First I would recommend STOPPING the medication for a few weeks until your ache and pain symptoms completely RESOLVE. Then you can restart at a LOWER DOSE either HALF a pill at bedtime every night or LESS OFTEN such as one pill a week only and then gradually increase to every other day or max dose of 3 times a week  Lastly, sometimes we need to try other versions of this medicine to find one that works for you and does not cause side effects.   Please schedule a Follow-up Appointment to: Return in about 3 months (around 04/01/2020) for 1 month nurse visit [facility] - 3 month f/u DM A1c needs Urine POC Microalbumin - start Statin/ARB?Marland Kitchen  If you have any other questions or concerns, please feel free to call the office or send a message through [place]. You may also schedule an earlier appointment if necessary.  Additionally, you may be  receiving a survey about your experience at our office within a few days to 1 week by e-mail or mail. We value your feedback.  Nobie Putnam, DO [place]

## 2020-01-02 NOTE — Assessment & Plan Note (Signed)
Elevated DBP, majorly improve on recheck  Check BP at home if able - needs to check outside readings again reviewed. Continue lifestyle, counseling Follow-up  Goal for wt loss improve BP  Next apt if still elevated BP - will offer ACEi/ARB as well as DM patient

## 2020-01-02 NOTE — Assessment & Plan Note (Signed)
Prior trend over past 10 years with similar pattern elevated LFTs Related to alcohol and weight and cholesterol  Improved again now, but not at baseline Last Liver US 2011 had hepatic steatosis  Advice on reduce alcohol, weight loss, lifestyle, control cholesterol - hold on new cholesterol med for now. Future reconsider RUQ abdomen imaging  Anticipate start statin next visit if agreeable

## 2020-01-16 DIAGNOSIS — M9903 Segmental and somatic dysfunction of lumbar region: Secondary | ICD-10-CM | POA: Diagnosis not present

## 2020-01-16 DIAGNOSIS — M5413 Radiculopathy, cervicothoracic region: Secondary | ICD-10-CM | POA: Diagnosis not present

## 2020-01-16 DIAGNOSIS — M9901 Segmental and somatic dysfunction of cervical region: Secondary | ICD-10-CM | POA: Diagnosis not present

## 2020-01-16 DIAGNOSIS — M9904 Segmental and somatic dysfunction of sacral region: Secondary | ICD-10-CM | POA: Diagnosis not present

## 2020-01-21 ENCOUNTER — Other Ambulatory Visit: Payer: Self-pay

## 2020-01-21 ENCOUNTER — Ambulatory Visit (INDEPENDENT_AMBULATORY_CARE_PROVIDER_SITE_OTHER): Payer: BC Managed Care – PPO

## 2020-01-21 DIAGNOSIS — Z23 Encounter for immunization: Secondary | ICD-10-CM | POA: Diagnosis not present

## 2020-02-03 DIAGNOSIS — M9901 Segmental and somatic dysfunction of cervical region: Secondary | ICD-10-CM | POA: Diagnosis not present

## 2020-02-03 DIAGNOSIS — M9904 Segmental and somatic dysfunction of sacral region: Secondary | ICD-10-CM | POA: Diagnosis not present

## 2020-02-03 DIAGNOSIS — M9903 Segmental and somatic dysfunction of lumbar region: Secondary | ICD-10-CM | POA: Diagnosis not present

## 2020-02-03 DIAGNOSIS — M5413 Radiculopathy, cervicothoracic region: Secondary | ICD-10-CM | POA: Diagnosis not present

## 2020-02-24 ENCOUNTER — Ambulatory Visit: Payer: BC Managed Care – PPO

## 2020-03-03 DIAGNOSIS — M9903 Segmental and somatic dysfunction of lumbar region: Secondary | ICD-10-CM | POA: Diagnosis not present

## 2020-03-03 DIAGNOSIS — M9904 Segmental and somatic dysfunction of sacral region: Secondary | ICD-10-CM | POA: Diagnosis not present

## 2020-03-03 DIAGNOSIS — M9901 Segmental and somatic dysfunction of cervical region: Secondary | ICD-10-CM | POA: Diagnosis not present

## 2020-03-03 DIAGNOSIS — M5413 Radiculopathy, cervicothoracic region: Secondary | ICD-10-CM | POA: Diagnosis not present

## 2020-03-12 ENCOUNTER — Other Ambulatory Visit: Payer: Self-pay | Admitting: Family Medicine

## 2020-03-12 DIAGNOSIS — E1169 Type 2 diabetes mellitus with other specified complication: Secondary | ICD-10-CM

## 2020-03-12 NOTE — Telephone Encounter (Signed)
Requested medication (s) are due for refill today: Yes  Requested medication (s) are on the active medication list: Yes  Future visit scheduled: Yes  Notes to clinic:  See notes from pharmacy, need a substitution.     Requested Prescriptions  Pending Prescriptions Disp Refills   OZEMPIC, 1 MG/DOSE, 4 MG/3ML SOPN [Pharmacy Med Name: OZEMPIC 1 MG/DOSE (4 MG/3 ML)]  3    Sig: Inject 1 mg into the skin once a week.      Endocrinology:  Diabetes - GLP-1 Receptor Agonists Passed - 03/12/2020 10:40 AM      Passed - HBA1C is between 0 and 7.9 and within 180 days    Hgb A1c MFr Bld  Date Value Ref Range Status  12/26/2019 5.7 (H) <5.7 % of total Hgb Final    Comment:    For someone without known diabetes, a hemoglobin  A1c value between 5.7% and 6.4% is consistent with prediabetes and should be confirmed with a  follow-up test. . For someone with known diabetes, a value <7% indicates that their diabetes is well controlled. A1c targets should be individualized based on duration of diabetes, age, comorbid conditions, and other considerations. . This assay result is consistent with an increased risk of diabetes. . Currently, no consensus exists regarding use of hemoglobin A1c for diagnosis of diabetes for children. Renella Cunas - Valid encounter within last 6 months    Recent Outpatient Visits           2 months ago Type 2 diabetes mellitus with other specified complication, without long-term current use of insulin ([place])   [facility], Devonne Doughty, DO   5 months ago Annual physical exam   [facility] Olin Hauser, DO   11 months ago Type 2 diabetes mellitus with other specified complication, without long-term current use of insulin [facility])   [place], DO   1 year ago Annual physical exam   [facility] Olin Hauser, DO   1 year ago Type 2  diabetes mellitus with other specified complication, without long-term current use of insulin ([place])   [facility], Devonne Doughty, DO       Future Appointments             In 2 weeks [facility], Devonne Doughty, DO [facility], [facility]

## 2020-03-12 NOTE — Telephone Encounter (Signed)
I would prefer not to change this particular medication.  Would you be able to reach our [employer] drug rep Andris Flurry to see if he can find out what the status of the back order is and if this is on back order everywhere or if we can switch pharmacy to get this med filled?  Nobie Putnam, [facility] 03/12/2020, 3:33 PM

## 2020-03-13 ENCOUNTER — Other Ambulatory Visit: Payer: Self-pay

## 2020-03-13 ENCOUNTER — Ambulatory Visit (INDEPENDENT_AMBULATORY_CARE_PROVIDER_SITE_OTHER): Payer: BC Managed Care – PPO

## 2020-03-13 DIAGNOSIS — Z23 Encounter for immunization: Secondary | ICD-10-CM | POA: Diagnosis not present

## 2020-03-13 NOTE — Telephone Encounter (Signed)
I called and spoke with Aaron Edelman and he informed me that Ozempic is not on backorder. He said that they have [facility] the [place] medication in [facility]. He will follow up with the pharmacy and call us back.

## 2020-03-13 NOTE — Telephone Encounter (Signed)
Thank you. I will decline this refill now and will wait on call back from [place].  If you can follow-up with him next week that would be great.  Thanks  Nobie Putnam, [facility] 03/13/2020, 5:26 PM

## 2020-03-16 DIAGNOSIS — M9901 Segmental and somatic dysfunction of cervical region: Secondary | ICD-10-CM | POA: Diagnosis not present

## 2020-03-16 DIAGNOSIS — M9903 Segmental and somatic dysfunction of lumbar region: Secondary | ICD-10-CM | POA: Diagnosis not present

## 2020-03-16 DIAGNOSIS — M9904 Segmental and somatic dysfunction of sacral region: Secondary | ICD-10-CM | POA: Diagnosis not present

## 2020-03-16 DIAGNOSIS — M5413 Radiculopathy, cervicothoracic region: Secondary | ICD-10-CM | POA: Diagnosis not present

## 2020-03-17 DIAGNOSIS — M9901 Segmental and somatic dysfunction of cervical region: Secondary | ICD-10-CM | POA: Diagnosis not present

## 2020-03-17 DIAGNOSIS — M9903 Segmental and somatic dysfunction of lumbar region: Secondary | ICD-10-CM | POA: Diagnosis not present

## 2020-03-17 DIAGNOSIS — M9904 Segmental and somatic dysfunction of sacral region: Secondary | ICD-10-CM | POA: Diagnosis not present

## 2020-03-17 DIAGNOSIS — M5413 Radiculopathy, cervicothoracic region: Secondary | ICD-10-CM | POA: Diagnosis not present

## 2020-03-19 DIAGNOSIS — M5413 Radiculopathy, cervicothoracic region: Secondary | ICD-10-CM | POA: Diagnosis not present

## 2020-03-19 DIAGNOSIS — M9904 Segmental and somatic dysfunction of sacral region: Secondary | ICD-10-CM | POA: Diagnosis not present

## 2020-03-19 DIAGNOSIS — M9901 Segmental and somatic dysfunction of cervical region: Secondary | ICD-10-CM | POA: Diagnosis not present

## 2020-03-19 DIAGNOSIS — M9903 Segmental and somatic dysfunction of lumbar region: Secondary | ICD-10-CM | POA: Diagnosis not present

## 2020-04-01 ENCOUNTER — Ambulatory Visit (INDEPENDENT_AMBULATORY_CARE_PROVIDER_SITE_OTHER): Payer: BC Managed Care – PPO | Admitting: Family Medicine

## 2020-04-01 ENCOUNTER — Other Ambulatory Visit: Payer: Self-pay

## 2020-04-01 ENCOUNTER — Encounter: Payer: Self-pay | Admitting: Family Medicine

## 2020-04-01 ENCOUNTER — Other Ambulatory Visit: Payer: Self-pay | Admitting: Family Medicine

## 2020-04-01 VITALS — BP 129/83 | HR 66 | Ht 75.0 in | Wt 244.8 lb

## 2020-04-01 DIAGNOSIS — R7989 Other specified abnormal findings of blood chemistry: Secondary | ICD-10-CM

## 2020-04-01 DIAGNOSIS — E1169 Type 2 diabetes mellitus with other specified complication: Secondary | ICD-10-CM | POA: Diagnosis not present

## 2020-04-01 DIAGNOSIS — H9313 Tinnitus, bilateral: Secondary | ICD-10-CM

## 2020-04-01 DIAGNOSIS — R42 Dizziness and giddiness: Secondary | ICD-10-CM | POA: Diagnosis not present

## 2020-04-01 DIAGNOSIS — R55 Syncope and collapse: Secondary | ICD-10-CM

## 2020-04-01 DIAGNOSIS — E785 Hyperlipidemia, unspecified: Secondary | ICD-10-CM

## 2020-04-01 DIAGNOSIS — R030 Elevated blood-pressure reading, without diagnosis of hypertension: Secondary | ICD-10-CM

## 2020-04-01 DIAGNOSIS — K760 Fatty (change of) liver, not elsewhere classified: Secondary | ICD-10-CM

## 2020-04-01 DIAGNOSIS — R351 Nocturia: Secondary | ICD-10-CM

## 2020-04-01 DIAGNOSIS — Z0000 Encounter for general adult medical examination without abnormal findings: Secondary | ICD-10-CM

## 2020-04-01 DIAGNOSIS — Z125 Encounter for screening for malignant neoplasm of prostate: Secondary | ICD-10-CM

## 2020-04-01 LAB — POCT UA - MICROALBUMIN: Microalbumin Ur, POC: NEGATIVE mg/L

## 2020-04-01 NOTE — Assessment & Plan Note (Signed)
DM A1c controlled at 5.7 previously - will re order A1c lab since POC not resulting today with error On lifestyle and GLP1 Still goal for wt loss No Hypoglycemia and RESOLVED Hyperglycemia. Complications - hyperlipidemia, GERD - increases risk of future cardiovascular complications   Plan:  Continue Ozempic 1mg  weekly Encourage improved lifestyle - low carb, low sugar diet, reduce portion size, continue improving restart regular exercise Urine microalbumin negative today. BP stable

## 2020-04-01 NOTE — Patient Instructions (Addendum)
Thank you for coming to the office today.  Sample for Ozempic  Today, 0.5 mg dosage would require x 2 doses on the day you use it, good for 2 weeks total  Call your pharmacy and others to find out if it is in stock, we can switch the order to a different pharmacy if needed  Lab test for A1c / blood count.  [facility] - Neurology Dept [address], [place] [address] Phone: [phone]  Referral to Neuro for Vertigo / Near Syncope  DUE for FASTING BLOOD WORK (no food or drink after midnight before the lab appointment, only water or coffee without cream/sugar on the morning of)  SCHEDULE "Lab Only" visit in the morning at the clinic for lab draw in 6 MONTHS   - Make sure Lab Only appointment is at about 1 week before your next appointment, so that results will be available  For Lab Results, once available within 2-3 days of blood draw, you can can log in to MyChart online to view your results and a brief explanation. Also, we can discuss results at next follow-up visit.    Please schedule a Follow-up Appointment to: Return in about 6 months (around 10/02/2020) for 6 month fasting lab only then 1 week later Annual PHysical.  If you have any other questions or concerns, please feel free to call the office or send a message through [place]. You may also schedule an earlier appointment if necessary.  Additionally, you may be receiving a survey about your experience at our office within a few days to 1 week by e-mail or mail. We value your feedback.  Nobie Putnam, DO [place]

## 2020-04-01 NOTE — Progress Notes (Signed)
Subjective:    Patient ID: Isaiah Hensen., male    DOB: 1970-06-11, 50 y.o.   MRN: [id number]  Isaiah Doyle. is a 50 y.o. male presenting on 04/01/2020 for Diabetes and elevated LFT's   HPI   Vertigo vs Dizziness Spells Reports history of Vertigo in past. He said one episode while driving, and also had one at a conference at work. - Prior history of Vertigo, he was having brief syncopal episodes and concern for vertigo, he was referred to [facility]. They did vertigo testing to confirm. He had episode recurrent episode 1 year later. He was taken to [facility] ED and again said likely vertigo - last episode 2005 and 2006 approx. - He did well until last year 2021, with the above described vertigo or dizzy spells, has had near syncope but no actual syncope. - He is interested in a 2nd opinion on Vertigo - He has been following with [facility] and Dr Tami Ribas for years, he had seen him for ear drum rupture in past, and has sensation of ear fullness and clogged, he has some tinnitus for a while as well. He feels like something is "in his ears" Denies chest pain or tightness, focal numbness tingling  CHRONIC DM, Type 2: Reportsno concerns, now improving with lifestyle changes. CBGs: Avg100-120, Low>100, High< 150. Checks CBGsx 1 daily fasting AM Meds:Ozempic 1mg  weekly Off Metformin Reports good compliance. Tolerating well w/o side-effects Currentlynot on ACEi/ARB Lifestyle: Weight up - Diet (Similar to last time - high protein options, salads) - Exercise (limited exercise due to time) Denies hypoglycemia, polyuria, visual changes, numbness or tingling.  Elevated BP without dx HTN He admitted a dizzy spell almost vertigo recently. Not checking home BP, has had some elevation before Fam history of HTN Current Meds -None Denies CP, dyspnea, HA, edema, dizziness / lightheadedness  History of Kidney Stones Recent had slight back pain but it has resolved. No actual  current symptoms. He usually takes Flomax PRNnone lately.  Elevated LFTs Last lab showed significant improvement with AST 40 (from 68) and  ALT now down to 87 (from 150) Drinks 2 shots most days after work, no more, he used to drink more but has reduced.  Future labs will be in 08/2020  Depression screen [facility] 2/9 01/02/2020 09/18/2019 09/18/2019  Decreased Interest 0 0 -  Down, Depressed, Hopeless 0 0 0  PHQ - 2 Score 0 0 0    Social History   Tobacco Use  . Smoking status: Former Smoker    Quit date: 01/25/2008    Years since quitting: 12.1  . Smokeless tobacco: Former Systems developer  . Tobacco comment: Initially quit 2001, then resumed temporarily 2010  Substance Use Topics  . Alcohol use: Yes    Alcohol/week: 12.0 standard drinks    Types: 12 Cans of beer per week  . Drug use: No    Review of Systems Per HPI unless specifically indicated above     Objective:    BP 129/83   Pulse 66   Ht 6\' 3"  (1.905 m)   Wt 244 lb 12.8 oz (111 kg)   SpO2 97%   BMI 30.60 kg/m   Wt Readings from Last 3 Encounters:  04/01/20 244 lb 12.8 oz (111 kg)  01/02/20 248 lb (112.5 kg)  11/01/19 236 lb (107 kg)    Physical Exam Vitals and nursing note reviewed.  Constitutional:      General: He is not in acute distress.  Appearance: He is well-developed and well-nourished. He is not diaphoretic.     Comments: Well-appearing, comfortable, cooperative  HENT:     Head: Normocephalic and atraumatic.     Mouth/Throat:     Mouth: Oropharynx is clear and moist.  Eyes:     General:        Right eye: No discharge.        Left eye: No discharge.     Conjunctiva/sclera: Conjunctivae normal.  Cardiovascular:     Rate and Rhythm: Normal rate.  Pulmonary:     Effort: Pulmonary effort is normal.  Musculoskeletal:        General: No edema.  Skin:    General: Skin is warm and dry.     Findings: No erythema or rash.  Neurological:     Mental Status: He is alert and oriented to person, place, and time.   Psychiatric:        Mood and Affect: Mood and affect normal.        Behavior: Behavior normal.     Comments: Well groomed, good eye contact, normal speech and thoughts      Recent Labs    09/13/19 0828 12/26/19 0823  HGBA1C 5.8* 5.7*     Results for orders placed or performed in visit on 04/01/20  POCT UA - Microalbumin  Result Value Ref Range   Microalbumin Ur, POC Negative mg/L   Creatinine, POC     Albumin/Creatinine Ratio, Urine, POC        Assessment & Plan:   Problem List Items Addressed This Visit    Type 2 diabetes mellitus with other specified complication ([place]) - Primary    DM A1c controlled at 5.7 previously - will re order A1c lab since POC not resulting today with error On lifestyle and GLP1 Still goal for wt loss No Hypoglycemia and RESOLVED Hyperglycemia. Complications - hyperlipidemia, GERD - increases risk of future cardiovascular complications   Plan:  Continue Ozempic 1mg  weekly Encourage improved lifestyle - low carb, low sugar diet, reduce portion size, continue improving restart regular exercise Urine microalbumin negative today. BP stable      Relevant Orders   Hemoglobin A1c   CBC with Differential/Platelet   Tinnitus   Elevated LFTs   Relevant Orders   POCT UA - Microalbumin (Completed)    Other Visit Diagnoses    Vertigo       Relevant Orders   Ambulatory referral to Neurology   Near syncope       Relevant Orders   Ambulatory referral to Neurology      No orders of the defined types were placed in this encounter.  #Vertigo / Near Syncopal episodes Chronic conditions, has had issue onset back >10-15 years ago, had prior work up Has followed Dr Jess Barters ENT in past with mixed results Now has had recurrence of symptoms Discussion today given concern with near syncopal episodes, not classic vertigo symptoms reproduced by movement or postural symptoms - no other complicating factor without concern of blood pressure or focal  neuro deficit or cardiovascular symptoms  Will proceed with referral to [facility] for consultation. May warrant further imaging or work up as to etiology of symptoms. If ultimately negative can consider Cardiology consult vs return to ENT  # Sample ozempic until can get from pharmacy, [facility] He will contact us if cannot [facility] or other pharmacy we can send other order in if need of Ozempic to new pharmacy if requested  Orders Placed This Encounter  Procedures  . Hemoglobin A1c  . CBC with Differential/Platelet  . Ambulatory referral to Neurology    Referral Priority:   Routine    Referral Type:   Consultation    Referral Reason:   Specialty Services Required    Requested Specialty:   Neurology    Number of Visits Requested:   1  . POCT UA - Microalbumin    Follow up plan: Return in about 6 months (around 10/02/2020) for 6 month fasting lab only then 1 week later Annual PHysical.  Future labs ordered for 08/2020  Nobie Putnam, [facility] 04/01/2020, 9:24 AM

## 2020-04-02 LAB — CBC WITH DIFFERENTIAL/PLATELET
Absolute Monocytes: 692 cells/uL (ref 200–950)
Basophils Absolute: 68 cells/uL (ref 0–200)
Basophils Relative: 0.9 %
Eosinophils Absolute: 152 cells/uL (ref 15–500)
Eosinophils Relative: 2 %
HCT: 44.3 % (ref 38.5–50.0)
Hemoglobin: 15.2 g/dL (ref 13.2–17.1)
Lymphs Abs: 2417 cells/uL (ref 850–3900)
MCH: 30.5 pg (ref 27.0–33.0)
MCHC: 34.3 g/dL (ref 32.0–36.0)
MCV: 89 fL (ref 80.0–100.0)
MPV: 11 fL (ref 7.5–12.5)
Monocytes Relative: 9.1 %
Neutro Abs: 4271 cells/uL (ref 1500–7800)
Neutrophils Relative %: 56.2 %
Platelets: 196 10*3/uL (ref 140–400)
RBC: 4.98 10*6/uL (ref 4.20–5.80)
RDW: 12.7 % (ref 11.0–15.0)
Total Lymphocyte: 31.8 %
WBC: 7.6 10*3/uL (ref 3.8–10.8)

## 2020-04-02 LAB — HEMOGLOBIN A1C
Hgb A1c MFr Bld: 5.7 % of total Hgb — ABNORMAL HIGH (ref <5.7)
Mean Plasma Glucose: 117 mg/dL
eAG (mmol/L): 6.5 mmol/L

## 2020-04-13 DIAGNOSIS — M9903 Segmental and somatic dysfunction of lumbar region: Secondary | ICD-10-CM | POA: Diagnosis not present

## 2020-04-13 DIAGNOSIS — M9901 Segmental and somatic dysfunction of cervical region: Secondary | ICD-10-CM | POA: Diagnosis not present

## 2020-04-13 DIAGNOSIS — M5413 Radiculopathy, cervicothoracic region: Secondary | ICD-10-CM | POA: Diagnosis not present

## 2020-04-13 DIAGNOSIS — M9904 Segmental and somatic dysfunction of sacral region: Secondary | ICD-10-CM | POA: Diagnosis not present

## 2020-04-23 ENCOUNTER — Other Ambulatory Visit: Payer: Self-pay

## 2020-04-23 ENCOUNTER — Telehealth: Payer: Self-pay

## 2020-04-23 DIAGNOSIS — E1169 Type 2 diabetes mellitus with other specified complication: Secondary | ICD-10-CM

## 2020-04-23 MED ORDER — OZEMPIC (1 MG/DOSE) 4 MG/3ML ~~LOC~~ SOPN: 1.0000 mg | PEN_INJECTOR | SUBCUTANEOUS | 3 refills | Status: DC

## 2020-04-23 NOTE — Telephone Encounter (Signed)
Copied from [place] [id number]. Topic: General - Other >> Apr 23, 2020 10:07 AM Jefferson Fuel, CMA wrote: Patient is requesting to have Ozempic sample until he is able to get from the pharmacy. [facility] so [facility] script to [facility] per patient request.

## 2020-04-23 NOTE — Telephone Encounter (Signed)
That is fine. He can come by to get sample Ozempic. He should be aware we do not sample 1mg  dosage. It will be max dosage 0.5mg  he can use TWO doses or just use one dose until he can get the full dose pen. The sample would have 4 doses of 0.5mg  in it.  Nobie Putnam, [facility] 04/23/2020, 12:36 PM

## 2020-04-23 NOTE — Telephone Encounter (Signed)
I called and spoke with the patient and informed him that Isaiah Doyle approved him picking up the sample of Ozempic. The pt stated he will come by today or tomorrow to get the sample.

## 2020-04-24 ENCOUNTER — Other Ambulatory Visit: Payer: Self-pay

## 2020-04-24 DIAGNOSIS — K219 Gastro-esophageal reflux disease without esophagitis: Secondary | ICD-10-CM

## 2020-04-24 DIAGNOSIS — E1169 Type 2 diabetes mellitus with other specified complication: Secondary | ICD-10-CM

## 2020-04-24 MED ORDER — OZEMPIC (1 MG/DOSE) 4 MG/3ML ~~LOC~~ SOPN: 1.0000 mg | PEN_INJECTOR | SUBCUTANEOUS | 3 refills | Status: DC

## 2020-04-30 ENCOUNTER — Telehealth: Payer: Self-pay

## 2020-04-30 NOTE — Telephone Encounter (Signed)
Patient states that he is due for another colonoscopy in October and would like the order to be sent over to GI.

## 2020-04-30 NOTE — Telephone Encounter (Signed)
Dr Bonna Gains,  Would you be able to assist this patient in scheduling future repeat 1 year colonoscopy in October 2022? Last result from 2021 on your result note shows the larger / multiple polyps were precancerous and repeat in 1 year was recommended. He contacted [place], but I do not believe he needs a new referral since has already been established at your office. Let me know if need anything else on our end. Thank you!  Nobie Putnam, [facility] 04/30/2020, 4:17 PM

## 2020-05-05 NOTE — Telephone Encounter (Signed)
Message will be sent through MyChart letting the patient know that he had been placed in our recall list and that we will let him know that he had been placed in our recall list via chart.

## 2020-05-08 IMAGING — CT CT RENAL STONE PROTOCOL
2 of 4 series · 16 of 46 positions shown, 18 images · non-contrast
Comparison: CT abdomen pelvis 11/10/2014.

CLINICAL DATA: Patient with right groin pain for 6 weeks.

EXAM:
CT ABDOMEN AND PELVIS WITHOUT CONTRAST
TECHNIQUE: Multidetector CT imaging of the abdomen and pelvis was performed
following the standard protocol without IV contrast.

[Series 2: renal stone · axial · 0.86mm/px · z∈[-1448,-923]mm · 13 of 117 slices shown, 15 images (1 of 2)]
[im 6/117  soft-tissue]
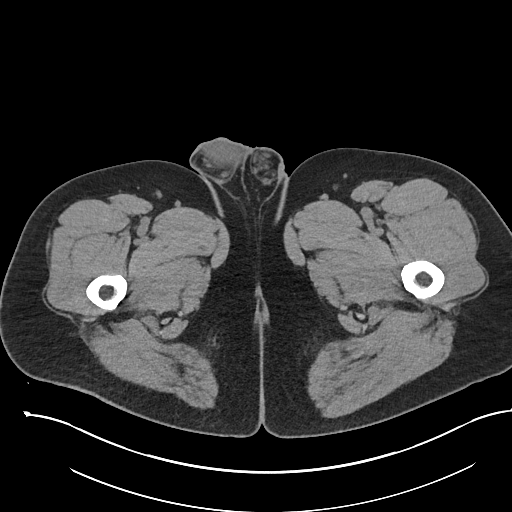
[im 6/117  bone]
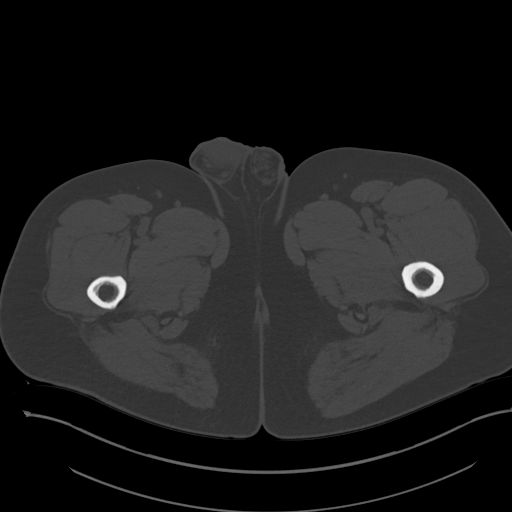
[im 16/117  soft-tissue]
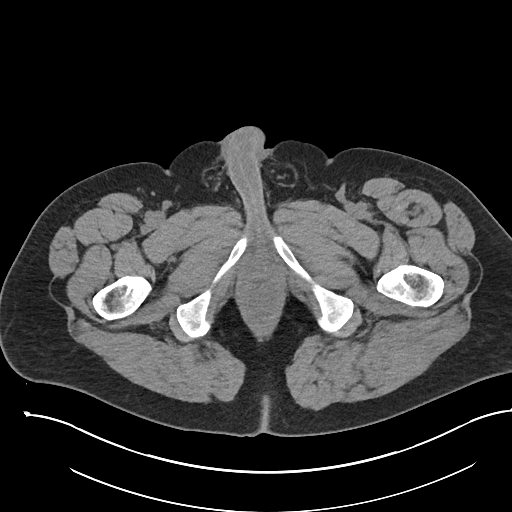
[im 26/117  soft-tissue]
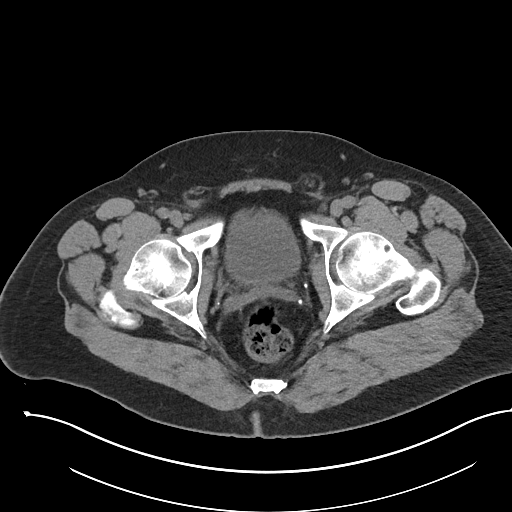
[im 31/117  soft-tissue]
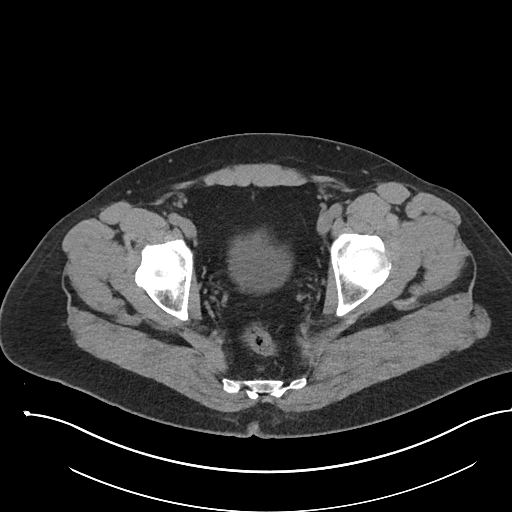
[im 41/117  soft-tissue]
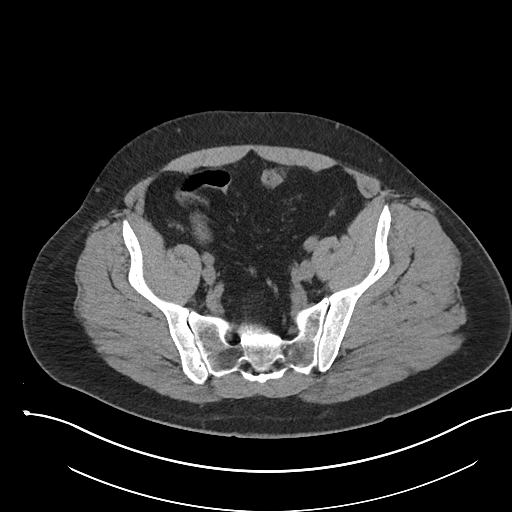
[im 51/117  soft-tissue]
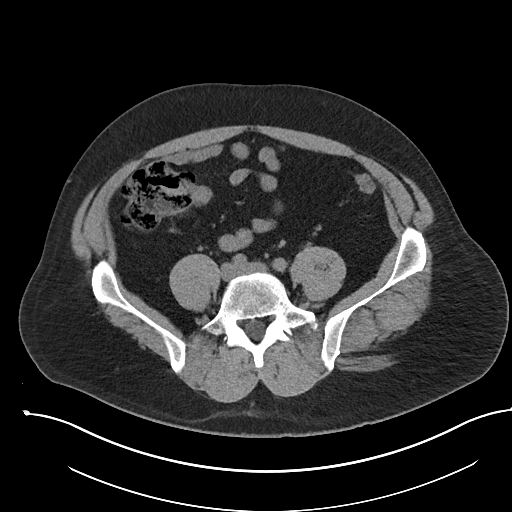
[im 61/117  soft-tissue]
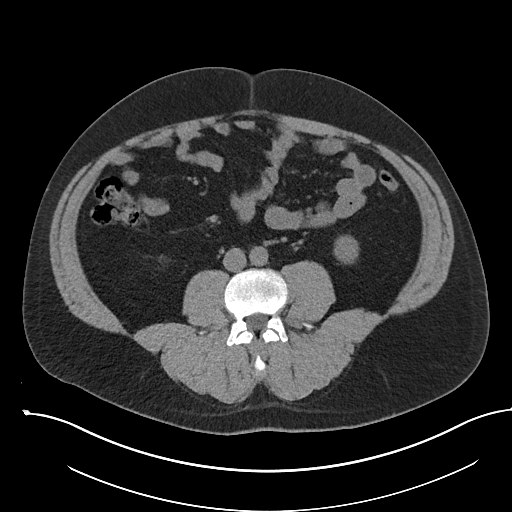
[im 66/117  soft-tissue]
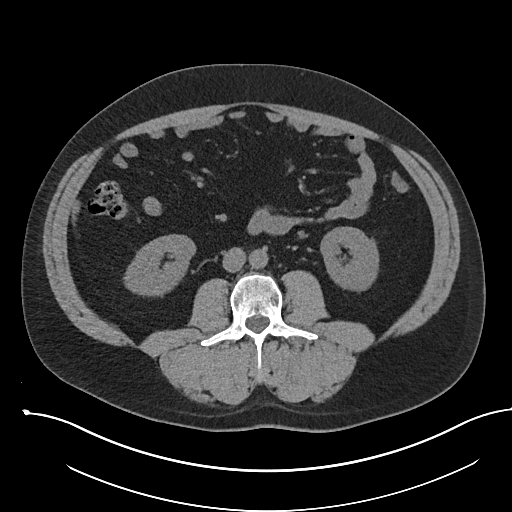
[im 76/117  soft-tissue]
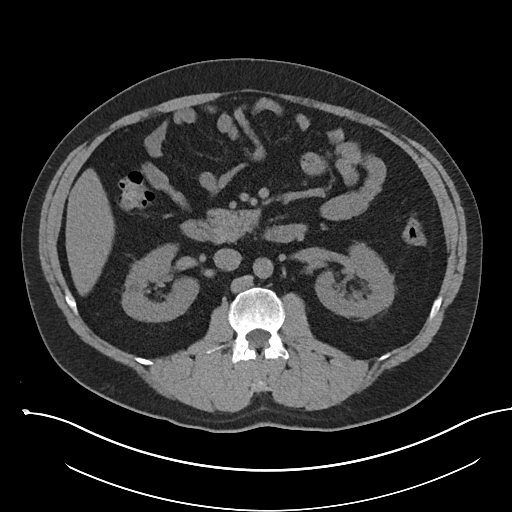
[im 76/117  bone]
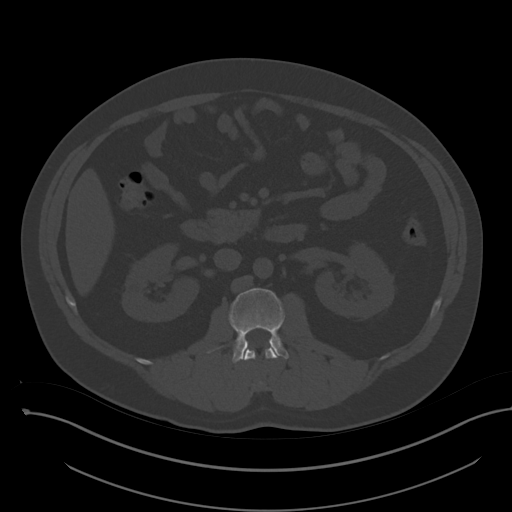
[im 86/117  soft-tissue]
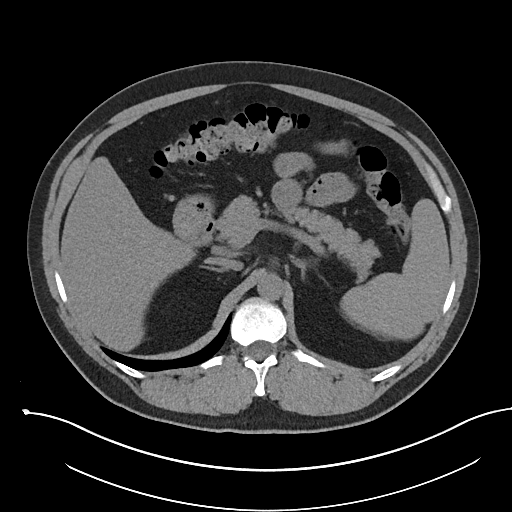
[im 91/117  soft-tissue]
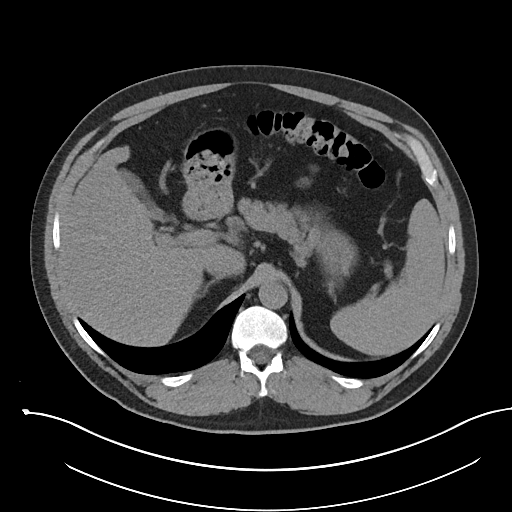
[im 101/117  soft-tissue]
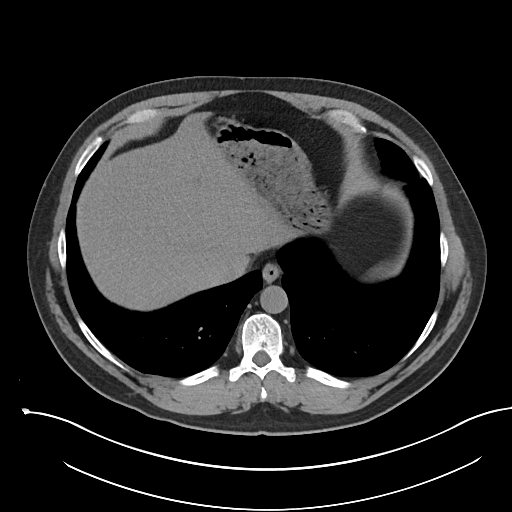
[im 111/117  soft-tissue]
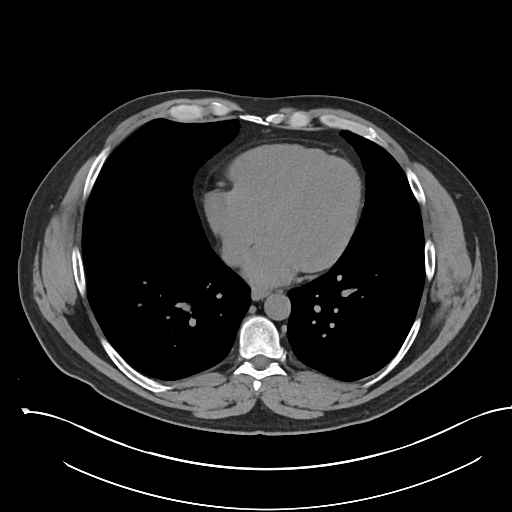

[Series 4: renal stone · coronal · 0.86mm/px · 3 of 169 slices shown (2 of 2)]
[im 57/169  soft-tissue]
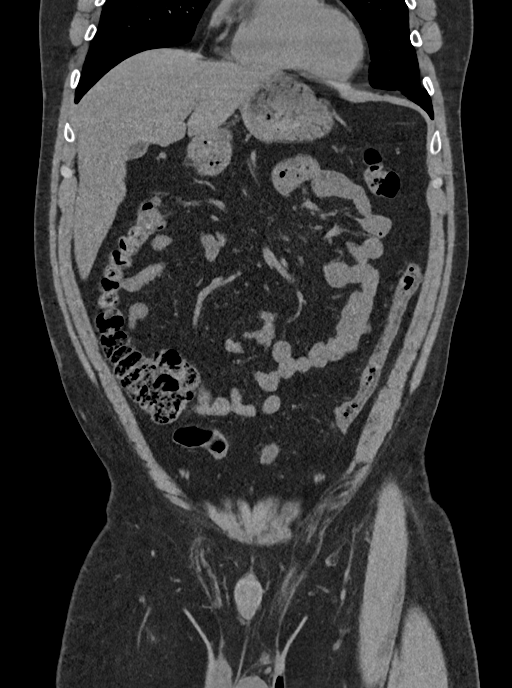
[im 75/169  soft-tissue]
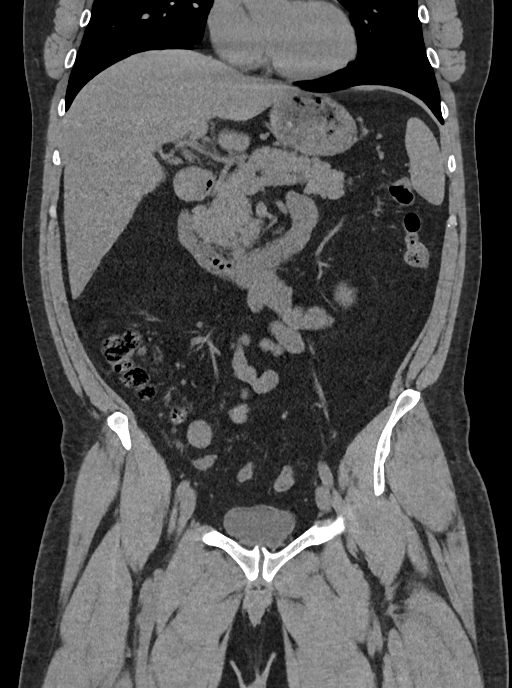
[im 94/169  soft-tissue]
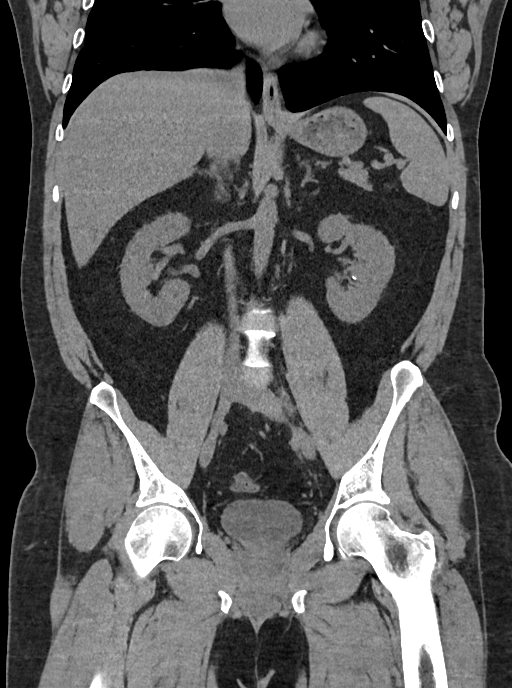

[16 of 46 positions shown; findings below may reference images not displayed]

FINDINGS: Lower chest: Normal heart size. Lung bases are clear. No pleural
effusion.

Hepatobiliary: Liver is normal in size and contour. No focal hepatic
lesion identified. Gallbladder is decompressed. No intrahepatic or
extrahepatic biliary ductal dilatation.

Pancreas: Unremarkable

Spleen: Unremarkable

Adrenals/Urinary Tract: Adrenal glands are unremarkable. Kidneys are
symmetric in size. Punctate stone inferior pole left kidney (image
88; series 4). 5 mm stone interpolar region left kidney and adjacent
6 mm stone interpolar region left kidney. 1.6 cm fluid attenuation
structure superior pole left kidney. Urinary bladder is
unremarkable. No ureterolithiasis. No hydronephrosis.

Stomach/Bowel: No abnormal bowel wall thickening or evidence for
bowel obstruction. No free fluid or free intraperitoneal air. Normal
morphology of the stomach.

Vascular/Lymphatic: Normal caliber abdominal aorta. No
retroperitoneal lymphadenopathy.

Reproductive: Prostate unremarkable.

Other: Small fat containing inguinal hernias bilaterally.

Musculoskeletal: Lumbar spine degenerative changes. No aggressive or
acute appearing osseous lesions.
IMPRESSION: 1. Nonobstructing left-sided renal stones.  No ureterolithiasis.
2. No acute process within the abdomen or pelvis.

## 2020-05-28 DIAGNOSIS — M5413 Radiculopathy, cervicothoracic region: Secondary | ICD-10-CM | POA: Diagnosis not present

## 2020-05-28 DIAGNOSIS — M9903 Segmental and somatic dysfunction of lumbar region: Secondary | ICD-10-CM | POA: Diagnosis not present

## 2020-05-28 DIAGNOSIS — M9904 Segmental and somatic dysfunction of sacral region: Secondary | ICD-10-CM | POA: Diagnosis not present

## 2020-05-28 DIAGNOSIS — M9901 Segmental and somatic dysfunction of cervical region: Secondary | ICD-10-CM | POA: Diagnosis not present

## 2020-06-11 DIAGNOSIS — M9904 Segmental and somatic dysfunction of sacral region: Secondary | ICD-10-CM | POA: Diagnosis not present

## 2020-06-11 DIAGNOSIS — M9903 Segmental and somatic dysfunction of lumbar region: Secondary | ICD-10-CM | POA: Diagnosis not present

## 2020-06-11 DIAGNOSIS — M5413 Radiculopathy, cervicothoracic region: Secondary | ICD-10-CM | POA: Diagnosis not present

## 2020-06-11 DIAGNOSIS — M9901 Segmental and somatic dysfunction of cervical region: Secondary | ICD-10-CM | POA: Diagnosis not present

## 2020-06-23 DIAGNOSIS — M5413 Radiculopathy, cervicothoracic region: Secondary | ICD-10-CM | POA: Diagnosis not present

## 2020-06-23 DIAGNOSIS — M9904 Segmental and somatic dysfunction of sacral region: Secondary | ICD-10-CM | POA: Diagnosis not present

## 2020-06-23 DIAGNOSIS — M9903 Segmental and somatic dysfunction of lumbar region: Secondary | ICD-10-CM | POA: Diagnosis not present

## 2020-06-23 DIAGNOSIS — M9901 Segmental and somatic dysfunction of cervical region: Secondary | ICD-10-CM | POA: Diagnosis not present

## 2020-07-08 DIAGNOSIS — M5413 Radiculopathy, cervicothoracic region: Secondary | ICD-10-CM | POA: Diagnosis not present

## 2020-07-08 DIAGNOSIS — M9903 Segmental and somatic dysfunction of lumbar region: Secondary | ICD-10-CM | POA: Diagnosis not present

## 2020-07-08 DIAGNOSIS — M9901 Segmental and somatic dysfunction of cervical region: Secondary | ICD-10-CM | POA: Diagnosis not present

## 2020-07-08 DIAGNOSIS — M9904 Segmental and somatic dysfunction of sacral region: Secondary | ICD-10-CM | POA: Diagnosis not present

## 2020-07-20 DIAGNOSIS — M5413 Radiculopathy, cervicothoracic region: Secondary | ICD-10-CM | POA: Diagnosis not present

## 2020-07-20 DIAGNOSIS — M9904 Segmental and somatic dysfunction of sacral region: Secondary | ICD-10-CM | POA: Diagnosis not present

## 2020-07-20 DIAGNOSIS — M9903 Segmental and somatic dysfunction of lumbar region: Secondary | ICD-10-CM | POA: Diagnosis not present

## 2020-07-20 DIAGNOSIS — M9901 Segmental and somatic dysfunction of cervical region: Secondary | ICD-10-CM | POA: Diagnosis not present

## 2020-07-28 DIAGNOSIS — M5413 Radiculopathy, cervicothoracic region: Secondary | ICD-10-CM | POA: Diagnosis not present

## 2020-07-28 DIAGNOSIS — M9903 Segmental and somatic dysfunction of lumbar region: Secondary | ICD-10-CM | POA: Diagnosis not present

## 2020-07-28 DIAGNOSIS — M9904 Segmental and somatic dysfunction of sacral region: Secondary | ICD-10-CM | POA: Diagnosis not present

## 2020-07-28 DIAGNOSIS — M9901 Segmental and somatic dysfunction of cervical region: Secondary | ICD-10-CM | POA: Diagnosis not present

## 2020-08-03 DIAGNOSIS — M9903 Segmental and somatic dysfunction of lumbar region: Secondary | ICD-10-CM | POA: Diagnosis not present

## 2020-08-03 DIAGNOSIS — M9901 Segmental and somatic dysfunction of cervical region: Secondary | ICD-10-CM | POA: Diagnosis not present

## 2020-08-03 DIAGNOSIS — M9904 Segmental and somatic dysfunction of sacral region: Secondary | ICD-10-CM | POA: Diagnosis not present

## 2020-08-03 DIAGNOSIS — M5413 Radiculopathy, cervicothoracic region: Secondary | ICD-10-CM | POA: Diagnosis not present

## 2020-08-13 DIAGNOSIS — M9904 Segmental and somatic dysfunction of sacral region: Secondary | ICD-10-CM | POA: Diagnosis not present

## 2020-08-13 DIAGNOSIS — M9903 Segmental and somatic dysfunction of lumbar region: Secondary | ICD-10-CM | POA: Diagnosis not present

## 2020-08-13 DIAGNOSIS — M5413 Radiculopathy, cervicothoracic region: Secondary | ICD-10-CM | POA: Diagnosis not present

## 2020-08-13 DIAGNOSIS — M9901 Segmental and somatic dysfunction of cervical region: Secondary | ICD-10-CM | POA: Diagnosis not present

## 2020-09-09 ENCOUNTER — Other Ambulatory Visit: Payer: BC Managed Care – PPO

## 2020-09-16 ENCOUNTER — Encounter: Payer: BC Managed Care – PPO | Admitting: Family Medicine

## 2020-09-21 DIAGNOSIS — M5413 Radiculopathy, cervicothoracic region: Secondary | ICD-10-CM | POA: Diagnosis not present

## 2020-09-21 DIAGNOSIS — M9904 Segmental and somatic dysfunction of sacral region: Secondary | ICD-10-CM | POA: Diagnosis not present

## 2020-09-21 DIAGNOSIS — M9901 Segmental and somatic dysfunction of cervical region: Secondary | ICD-10-CM | POA: Diagnosis not present

## 2020-09-21 DIAGNOSIS — M9903 Segmental and somatic dysfunction of lumbar region: Secondary | ICD-10-CM | POA: Diagnosis not present

## 2020-09-23 ENCOUNTER — Other Ambulatory Visit: Payer: Self-pay

## 2020-09-23 DIAGNOSIS — E785 Hyperlipidemia, unspecified: Secondary | ICD-10-CM

## 2020-09-23 DIAGNOSIS — E1169 Type 2 diabetes mellitus with other specified complication: Secondary | ICD-10-CM

## 2020-09-23 DIAGNOSIS — R351 Nocturia: Secondary | ICD-10-CM

## 2020-09-23 DIAGNOSIS — K760 Fatty (change of) liver, not elsewhere classified: Secondary | ICD-10-CM

## 2020-09-23 DIAGNOSIS — Z0000 Encounter for general adult medical examination without abnormal findings: Secondary | ICD-10-CM

## 2020-09-23 DIAGNOSIS — M9901 Segmental and somatic dysfunction of cervical region: Secondary | ICD-10-CM | POA: Diagnosis not present

## 2020-09-23 DIAGNOSIS — M5413 Radiculopathy, cervicothoracic region: Secondary | ICD-10-CM | POA: Diagnosis not present

## 2020-09-23 DIAGNOSIS — M9904 Segmental and somatic dysfunction of sacral region: Secondary | ICD-10-CM | POA: Diagnosis not present

## 2020-09-23 DIAGNOSIS — Z125 Encounter for screening for malignant neoplasm of prostate: Secondary | ICD-10-CM

## 2020-09-23 DIAGNOSIS — M9903 Segmental and somatic dysfunction of lumbar region: Secondary | ICD-10-CM | POA: Diagnosis not present

## 2020-09-23 DIAGNOSIS — R7989 Other specified abnormal findings of blood chemistry: Secondary | ICD-10-CM

## 2020-09-23 DIAGNOSIS — R030 Elevated blood-pressure reading, without diagnosis of hypertension: Secondary | ICD-10-CM

## 2020-09-24 ENCOUNTER — Other Ambulatory Visit: Payer: BC Managed Care – PPO

## 2020-09-24 ENCOUNTER — Other Ambulatory Visit: Payer: Self-pay

## 2020-09-24 DIAGNOSIS — E1169 Type 2 diabetes mellitus with other specified complication: Secondary | ICD-10-CM | POA: Diagnosis not present

## 2020-09-24 DIAGNOSIS — K760 Fatty (change of) liver, not elsewhere classified: Secondary | ICD-10-CM | POA: Diagnosis not present

## 2020-09-24 DIAGNOSIS — Z125 Encounter for screening for malignant neoplasm of prostate: Secondary | ICD-10-CM | POA: Diagnosis not present

## 2020-09-24 DIAGNOSIS — E785 Hyperlipidemia, unspecified: Secondary | ICD-10-CM | POA: Diagnosis not present

## 2020-09-24 DIAGNOSIS — R7989 Other specified abnormal findings of blood chemistry: Secondary | ICD-10-CM | POA: Diagnosis not present

## 2020-09-25 LAB — CBC WITH DIFFERENTIAL/PLATELET
Absolute Monocytes: 501 cells/uL (ref 200–950)
Basophils Absolute: 52 cells/uL (ref 0–200)
Basophils Relative: 0.8 %
Eosinophils Absolute: 169 cells/uL (ref 15–500)
Eosinophils Relative: 2.6 %
HCT: 45.7 % (ref 38.5–50.0)
Hemoglobin: 15.3 g/dL (ref 13.2–17.1)
Lymphs Abs: 2171 cells/uL (ref 850–3900)
MCH: 30.1 pg (ref 27.0–33.0)
MCHC: 33.5 g/dL (ref 32.0–36.0)
MCV: 89.8 fL (ref 80.0–100.0)
MPV: 10.4 fL (ref 7.5–12.5)
Monocytes Relative: 7.7 %
Neutro Abs: 3608 cells/uL (ref 1500–7800)
Neutrophils Relative %: 55.5 %
Platelets: 184 10*3/uL (ref 140–400)
RBC: 5.09 10*6/uL (ref 4.20–5.80)
RDW: 12.3 % (ref 11.0–15.0)
Total Lymphocyte: 33.4 %
WBC: 6.5 10*3/uL (ref 3.8–10.8)

## 2020-09-25 LAB — COMPLETE METABOLIC PANEL WITH GFR
AG Ratio: 1.7 (calc) (ref 1.0–2.5)
ALT: 115 U/L — ABNORMAL HIGH (ref 9–46)
AST: 53 U/L — ABNORMAL HIGH (ref 10–35)
Albumin: 4.7 g/dL (ref 3.6–5.1)
Alkaline phosphatase (APISO): 63 U/L (ref 35–144)
BUN: 12 mg/dL (ref 7–25)
CO2: 24 mmol/L (ref 20–32)
Calcium: 9.6 mg/dL (ref 8.6–10.3)
Chloride: 107 mmol/L (ref 98–110)
Creat: 0.99 mg/dL (ref 0.70–1.30)
Globulin: 2.8 g/dL (calc) (ref 1.9–3.7)
Glucose, Bld: 112 mg/dL — ABNORMAL HIGH (ref 65–99)
Potassium: 3.9 mmol/L (ref 3.5–5.3)
Sodium: 142 mmol/L (ref 135–146)
Total Bilirubin: 0.7 mg/dL (ref 0.2–1.2)
Total Protein: 7.5 g/dL (ref 6.1–8.1)
eGFR: 92 mL/min/{1.73_m2} (ref >=60)

## 2020-09-25 LAB — HEMOGLOBIN A1C
Hgb A1c MFr Bld: 5.7 % of total Hgb — ABNORMAL HIGH (ref <5.7)
Mean Plasma Glucose: 117 mg/dL
eAG (mmol/L): 6.5 mmol/L

## 2020-09-25 LAB — LIPID PANEL
Cholesterol: 222 mg/dL — ABNORMAL HIGH (ref <200)
HDL: 47 mg/dL (ref >=40)
LDL Cholesterol (Calc): 146 mg/dL (calc) — ABNORMAL HIGH
Non-HDL Cholesterol (Calc): 175 mg/dL (calc) — ABNORMAL HIGH (ref <130)
Total CHOL/HDL Ratio: 4.7 (calc) (ref <5.0)
Triglycerides: 159 mg/dL — ABNORMAL HIGH (ref <150)

## 2020-09-25 LAB — PSA: PSA: 0.25 ng/mL (ref <=4.00)

## 2020-09-25 LAB — TSH: TSH: 1.73 mIU/L (ref 0.40–4.50)

## 2020-10-01 ENCOUNTER — Other Ambulatory Visit: Payer: Self-pay

## 2020-10-01 ENCOUNTER — Telehealth: Payer: Self-pay

## 2020-10-01 ENCOUNTER — Encounter: Payer: Self-pay | Admitting: Family Medicine

## 2020-10-01 ENCOUNTER — Ambulatory Visit (INDEPENDENT_AMBULATORY_CARE_PROVIDER_SITE_OTHER): Payer: BC Managed Care – PPO | Admitting: Family Medicine

## 2020-10-01 ENCOUNTER — Other Ambulatory Visit: Payer: Self-pay | Admitting: Family Medicine

## 2020-10-01 VITALS — BP 161/87 | HR 73 | Ht 76.0 in | Wt 244.8 lb

## 2020-10-01 DIAGNOSIS — E1169 Type 2 diabetes mellitus with other specified complication: Secondary | ICD-10-CM

## 2020-10-01 DIAGNOSIS — I10 Essential (primary) hypertension: Secondary | ICD-10-CM

## 2020-10-01 DIAGNOSIS — E663 Overweight: Secondary | ICD-10-CM

## 2020-10-01 DIAGNOSIS — E785 Hyperlipidemia, unspecified: Secondary | ICD-10-CM

## 2020-10-01 DIAGNOSIS — Z86010 Personal history of colonic polyps: Secondary | ICD-10-CM

## 2020-10-01 DIAGNOSIS — Z0000 Encounter for general adult medical examination without abnormal findings: Secondary | ICD-10-CM

## 2020-10-01 DIAGNOSIS — Z8349 Family history of other endocrine, nutritional and metabolic diseases: Secondary | ICD-10-CM

## 2020-10-01 MED ORDER — PEG 3350-KCL-NA BICARB-NACL 420 G PO SOLR: ORAL | 0 refills | Status: DC

## 2020-10-01 MED ORDER — OLMESARTAN MEDOXOMIL 5 MG PO TABS: 5.0000 mg | ORAL_TABLET | Freq: Every day | ORAL | 1 refills | Status: DC

## 2020-10-01 NOTE — Assessment & Plan Note (Signed)
Lipids stable Complication hepatic steatosis, prior Liver US 2011 LFTs improved, ALT still >80, not at baseline The 10-year ASCVD risk score (Arnett DK, et al., 2019) is: 15.2%  Plan: 1. Discussed ASCVD risk and DM - offered statin again he is interested - will pursue once stable on ARB as next therapy now. 2. Encourage improved lifestyle - low carb/cholesterol, reduce portion size, continue improving regular exercise

## 2020-10-01 NOTE — Assessment & Plan Note (Signed)
DM A1c controlled at 5.7 On lifestyle and GLP1 Still goal for wt loss No Hypoglycemia and RESOLVED Hyperglycemia. Complications - hyperlipidemia, GERD - increases risk of future cardiovascular complications   Plan:  Continue Ozempic '1mg'$  weekly Encourage improved lifestyle - low carb, low sugar diet, reduce portion size, continue improving restart regular exercise Start ARB today Consider statin therapy will offer

## 2020-10-01 NOTE — Progress Notes (Signed)
Subjective:    Patient ID: Isaiah Hensen., male    DOB: [birth year], 50 y.o.   MRN: [id number]  Isaiah Beber. is a 50 y.o. male presenting on 10/01/2020 for Annual Exam and Diabetes   HPI  Here for Annual Physical and Lab Review.  CHRONIC DM, Type 2: Reports no concerns, now improving with lifestyle changes. CBGs: Avg 100-120, Low >100, High < 150. Checks CBGs x 1 daily fasting AM Meds:Ozempic 78m weekly Off Metformin Reports  good compliance. Tolerating well w/o side-effects Currently not on ACEi/ARB Lifestyle: Weight up - Diet (Similar to last time - high protein options, salads) - Exercise (limited exercise due to time) Denies hypoglycemia, polyuria, visual changes, numbness or tingling.  HYPERLIPIDEMIA: - Reports no concerns. Last lipid panel 08/2020, elevated LDL 140s Not on statin therapy  Sister dx Hemochromotosis  Elevated BP without dx HTN He admitted a dizzy spell almost vertigo recently. Not checking home BP, has had some elevation before Fam history of HTN Current Meds - None   Denies CP, dyspnea, HA, edema, dizziness / lightheadedness     History of Kidney Stones Recent had slight back pain but it has resolved. No actual current symptoms. He usually takes Flomax PRN none lately.   Elevated LFTs    Health Maintenance:  PSA 0.25 (2022)  Colon CA Screening, last colonoscopy 11/01/19 with multiple polyps, AGI Dr TBonna Gains advised repeat colonoscopy in 1 year. Now 10/2020 anticipated. He is waiting for this to be scheduled.   [facility] 01/02/2020 09/18/2019 09/18/2019  Decreased Interest 0 0 -  Down, Depressed, Hopeless 0 0 0  PHQ - 2 Score 0 0 0    Past Medical History:  Diagnosis Date   Allergy    Diabetes mellitus without complication (HCC)    GERD (gastroesophageal reflux disease)    Past Surgical History:  Procedure Laterality Date   COLONOSCOPY     COLONOSCOPY WITH PROPOFOL N/A 11/01/2019   Procedure: COLONOSCOPY WITH  PROPOFOL;  Surgeon: TVirgel Manifold MD;  Location: [facility] ENDOSCOPY;  Service: Endoscopy;  Laterality: N/A;   ESOPHAGOGASTRODUODENOSCOPY (EGD) WITH PROPOFOL N/A 11/13/2014   Procedure: ESOPHAGOGASTRODUODENOSCOPY (EGD) WITH PROPOFOL;  Surgeon: PHulen Luster MD;  [facility]  Service: Gastroenterology;  Laterality: N/A;   TONSILLECTOMY     WRIST MASS EXCISION     Social History   Socioeconomic History   Marital status: Married    Spouse name: Not on file   Number of children: Not on file   Years of education: Not on file   Highest education level: Not on file  Occupational History   Not on file  Tobacco Use   Smoking status: Former    Types: Cigarettes    Quit date: 01/25/2008    Years since quitting: 12.6   Smokeless tobacco: Former   Tobacco comments:    Initially quit 2001, then resumed temporarily 2010  Substance and Sexual Activity   Alcohol use: Yes    Alcohol/week: 12.0 standard drinks    Types: 12 Cans of beer per week   Drug use: No   Sexual activity: Not on file  Other Topics Concern   Not on file  Social History Narrative   Not on file   Social Determinants of Health   Financial Resource Strain: Not on file  Food Insecurity: Not on file  Transportation Needs: Not on file  Physical Activity: Not on file  Stress: Not on file  Social Connections: Not  on file  Intimate Partner Violence: Not on file   Family History  Problem Relation Age of Onset   Cancer Mother        BREAST   Heart attack Father 28       57 repeat   Heart disease Father 39   Colon cancer Paternal Grandfather 21   Prostate cancer Neg Hx    Current Outpatient Medications on File Prior to Visit  Medication Sig   acetaminophen (TYLENOL) 500 MG tablet Take 500 mg by mouth every 6 (six) hours as needed.   cetirizine (ZYRTEC) 10 MG tablet Take 10 mg by mouth as needed for allergies.   ibuprofen (ADVIL,MOTRIN) 200 MG tablet Take 200 mg by mouth every 6 (six) hours as needed.    Multiple Vitamin (MULTIVITAMIN) tablet Take 1 tablet by mouth daily.   omeprazole (PRILOSEC) 20 MG capsule TAKE 1 CAPSULE (20 MG TOTAL) BY MOUTH DAILY BEFORE BREAKFAST.   OZEMPIC, 1 MG/DOSE, 4 MG/3ML SOPN Inject 1 mg into the skin once a week.   No current facility-administered medications on file prior to visit.    Review of Systems  Constitutional:  Negative for activity change, appetite change, chills, diaphoresis, fatigue and fever.  HENT:  Negative for congestion and hearing loss.   Eyes:  Negative for visual disturbance.  Respiratory:  Negative for cough, chest tightness, shortness of breath and wheezing.   Cardiovascular:  Negative for chest pain, palpitations and leg swelling.  Gastrointestinal:  Negative for abdominal pain, constipation, diarrhea, nausea and vomiting.  Genitourinary:  Negative for dysuria, frequency and hematuria.  Musculoskeletal:  Negative for arthralgias and neck pain.  Skin:  Negative for rash.  Allergic/Immunologic: Negative for environmental allergies.  Neurological:  Negative for dizziness, weakness, light-headedness, numbness and headaches.  Hematological:  Negative for adenopathy.  Psychiatric/Behavioral:  Negative for behavioral problems, dysphoric mood and sleep disturbance.   Per HPI unless specifically indicated above      Objective:    BP (!) 161/87   Pulse 73   Ht 6' 4"  (1.93 m)   Wt 244 lb 12.8 oz (111 kg)   SpO2 99%   BMI 29.80 kg/m   Wt Readings from Last 3 Encounters:  10/01/20 244 lb 12.8 oz (111 kg)  04/01/20 244 lb 12.8 oz (111 kg)  01/02/20 248 lb (112.5 kg)    Physical Exam Vitals and nursing note reviewed.  Constitutional:      General: He is not in acute distress.    Appearance: He is well-developed. He is not diaphoretic.     Comments: Well-appearing, comfortable, cooperative  HENT:     Head: Normocephalic and atraumatic.  Eyes:     General:        Right eye: No discharge.        Left eye: No discharge.      Conjunctiva/sclera: Conjunctivae normal.     Pupils: Pupils are equal, round, and reactive to light.  Neck:     Thyroid: No thyromegaly.  Cardiovascular:     Rate and Rhythm: Normal rate and regular rhythm.     Pulses: Normal pulses.     Heart sounds: Normal heart sounds. No murmur heard. Pulmonary:     Effort: Pulmonary effort is normal. No respiratory distress.     Breath sounds: Normal breath sounds. No wheezing or rales.  Abdominal:     General: Bowel sounds are normal. There is no distension.     Palpations: Abdomen is soft. There is no mass.  Tenderness: There is no abdominal tenderness.  Musculoskeletal:        General: No tenderness. Normal range of motion.     Cervical back: Normal range of motion and neck supple.     Right lower leg: No edema.     Left lower leg: No edema.     Comments: Upper / Lower Extremities: - Normal muscle tone, strength bilateral upper extremities 5/5, lower extremities 5/5  Lymphadenopathy:     Cervical: No cervical adenopathy.  Skin:    General: Skin is warm and dry.     Findings: No erythema or rash.  Neurological:     Mental Status: He is alert and oriented to person, place, and time.     Comments: Distal sensation intact to light touch all extremities  Psychiatric:        Mood and Affect: Mood normal.        Behavior: Behavior normal.        Thought Content: Thought content normal.     Comments: Well groomed, good eye contact, normal speech and thoughts    Diabetic Foot Exam - Simple   Simple Foot Form Diabetic Foot exam was performed with the following findings: Yes 10/01/2020 10:15 AM  Visual Inspection No deformities, no ulcerations, no other skin breakdown bilaterally: Yes Sensation Testing Intact to touch and monofilament testing bilaterally: Yes Pulse Check Posterior Tibialis and Dorsalis pulse intact bilaterally: Yes Comments     Results for orders placed or performed in visit on 09/23/20  TSH  Result Value Ref Range    TSH 1.73 0.40 - 4.50 mIU/L  PSA  Result Value Ref Range   PSA 0.25 < OR = 4.00 ng/mL  Lipid panel  Result Value Ref Range   Cholesterol 222 (H) <200 mg/dL   HDL 47 > OR = 40 mg/dL   Triglycerides 159 (H) <150 mg/dL   LDL Cholesterol (Calc) 146 (H) mg/dL (calc)   Total CHOL/HDL Ratio 4.7 <5.0 (calc)   Non-HDL Cholesterol (Calc) 175 (H) <130 mg/dL (calc)  COMPLETE METABOLIC PANEL WITH GFR  Result Value Ref Range   Glucose, Bld 112 (H) 65 - 99 mg/dL   BUN 12 7 - 25 mg/dL   Creat 0.99 0.70 - 1.30 mg/dL   eGFR 92 > OR = 60 mL/min/1.67m   BUN/Creatinine Ratio NOT APPLICABLE 6 - 22 (calc)   Sodium 142 135 - 146 mmol/L   Potassium 3.9 3.5 - 5.3 mmol/L   Chloride 107 98 - 110 mmol/L   CO2 24 20 - 32 mmol/L   Calcium 9.6 8.6 - 10.3 mg/dL   Total Protein 7.5 6.1 - 8.1 g/dL   Albumin 4.7 3.6 - 5.1 g/dL   Globulin 2.8 1.9 - 3.7 g/dL (calc)   AG Ratio 1.7 1.0 - 2.5 (calc)   Total Bilirubin 0.7 0.2 - 1.2 mg/dL   Alkaline phosphatase (APISO) 63 35 - 144 U/L   AST 53 (H) 10 - 35 U/L   ALT 115 (H) 9 - 46 U/L  CBC with Differential/Platelet  Result Value Ref Range   WBC 6.5 3.8 - 10.8 Thousand/uL   RBC 5.09 4.20 - 5.80 Million/uL   Hemoglobin 15.3 13.2 - 17.1 g/dL   HCT 45.7 38.5 - 50.0 %   MCV 89.8 80.0 - 100.0 fL   MCH 30.1 27.0 - 33.0 pg   MCHC 33.5 32.0 - 36.0 g/dL   RDW 12.3 11.0 - 15.0 %   Platelets 184 140 - 400 Thousand/uL  MPV 10.4 7.5 - 12.5 fL   Neutro Abs 3,608 1,500 - 7,800 cells/uL   Lymphs Abs 2,171 850 - 3,900 cells/uL   Absolute Monocytes 501 200 - 950 cells/uL   Eosinophils Absolute 169 15 - 500 cells/uL   Basophils Absolute 52 0 - 200 cells/uL   Neutrophils Relative % 55.5 %   Total Lymphocyte 33.4 %   Monocytes Relative 7.7 %   Eosinophils Relative 2.6 %   Basophils Relative 0.8 %  Hemoglobin A1c  Result Value Ref Range   Hgb A1c MFr Bld 5.7 (H) <5.7 % of total Hgb   Mean Plasma Glucose 117 mg/dL   eAG (mmol/L) 6.5 mmol/L      Assessment & Plan:    Problem List Items Addressed This Visit     Type 2 diabetes mellitus with other specified complication (HCC)    DM A1c controlled at 5.7 On lifestyle and GLP1 Still goal for wt loss No Hypoglycemia and RESOLVED Hyperglycemia. Complications - hyperlipidemia, GERD - increases risk of future cardiovascular complications   Plan:  Continue Ozempic 79m weekly Encourage improved lifestyle - low carb, low sugar diet, reduce portion size, continue improving restart regular exercise Start ARB today Consider statin therapy will offer      Relevant Medications   olmesartan (BENICAR) 5 MG tablet   Overweight (BMI 25.0-29.9)   Hyperlipidemia associated with type 2 diabetes mellitus ([place]    Lipids stable Complication hepatic steatosis, prior Liver [place]2011 LFTs improved, ALT still >80, not at baseline The 10-year ASCVD risk score (Arnett DK, et al., 2019) is: 15.2%  Plan: 1. Discussed ASCVD risk and DM - offered statin again he is interested - will pursue once stable on ARB as next therapy now. 2. Encourage improved lifestyle - low carb/cholesterol, reduce portion size, continue improving regular exercise      Relevant Medications   olmesartan (BENICAR) 5 MG tablet   Essential hypertension    Elevated BP still Home BP readings limited    Plan:  1. Start ARB Olmesartan 568mdaily for HTN and for prevention kidney disease 2. Encourage improved lifestyle - low sodium diet, regular exercise 3. Start monitor BP outside office, bring readings to next visit, if persistently >140/90 or new symptoms notify office sooner 4. Follow-up 3 months w/ labs chemistry       Relevant Medications   olmesartan (BENICAR) 5 MG tablet   Other Visit Diagnoses     Annual physical exam    -  Primary       Updated Health Maintenance information Reviewed recent lab results with patient Encouraged improvement to lifestyle with diet and exercise Goal of weight loss  Ozempic 0.68m6mample for 1 month -  waiting on backorder 1mg52mse  Start ARB now Check labs in 3 months, counseling on angioedema risk and benefits  Add lab Ferritin with fam history hemochromatosis, however he has normal CBC     Meds ordered this encounter  Medications   olmesartan (BENICAR) 5 MG tablet    Sig: Take 1 tablet (5 mg total) by mouth daily.    Dispense:  90 tablet    Refill:  1      Follow up plan: Return in about 3 months (around 12/31/2020) for 3 month non fasting lab only then 1 week later Follow-up DM, HTN, med.  AlexNobie Putnam [facility] 10/01/2020, 9:52 AM

## 2020-10-01 NOTE — Patient Instructions (Addendum)
Thank you for coming to the office today.  Start Olmesartan '5mg'$  daily for blood pressure and kidney protection  Caution angioedema facial lip tongue throat swelling reaction very rarely on this med. Stop med and seek care if you develop this and difficulty with airway or breathing.  Sample Ozempic 0.'5mg'$  weekly for 1 month today  Lab to include Ferritin next time, your iron level hemoglobin was normal this time.  Kidney function is normal as well today.  We sent a message to [facility] today waiting to hear back. They should schedule for October 2022, if not heard back in 1 week you can call or message them again, and let me know if need help.  DUE for FASTING BLOOD WORK (no food or drink after midnight before the lab appointment, only water or coffee without cream/sugar on the morning of)  SCHEDULE "Lab Only" visit in the morning at the clinic for lab draw in 3 MONTHS   - Make sure Lab Only appointment is at about 1 week before your next appointment, so that results will be available  For Lab Results, once available within 2-3 days of blood draw, you can can log in to MyChart online to view your results and a brief explanation. Also, we can discuss results at next follow-up visit.    Please schedule a Follow-up Appointment to: Return in about 3 months (around 12/31/2020) for 3 month non fasting lab only then 1 week later Follow-up DM, HTN, med.  If you have any other questions or concerns, please feel free to call the office or send a message through [facility]. You may also schedule an earlier appointment if necessary.  Additionally, you may be receiving a survey about your experience at our office within a few days to 1 week by e-mail or mail. We value your feedback.  Isaiah Putnam, DO [place]

## 2020-10-01 NOTE — Assessment & Plan Note (Signed)
Elevated BP still Home BP readings limited    Plan:  1. Start ARB Olmesartan '5mg'$  daily for HTN and for prevention kidney disease 2. Encourage improved lifestyle - low sodium diet, regular exercise 3. Start monitor BP outside office, bring readings to next visit, if persistently >140/90 or new symptoms notify office sooner 4. Follow-up 3 months w/ labs chemistry

## 2020-10-01 NOTE — Telephone Encounter (Signed)
Called patient back and he scheduled his colonoscopy for 11/13/2020 with Dr. Bonna Gains.

## 2020-10-06 DIAGNOSIS — M9901 Segmental and somatic dysfunction of cervical region: Secondary | ICD-10-CM | POA: Diagnosis not present

## 2020-10-06 DIAGNOSIS — M5413 Radiculopathy, cervicothoracic region: Secondary | ICD-10-CM | POA: Diagnosis not present

## 2020-10-06 DIAGNOSIS — M9903 Segmental and somatic dysfunction of lumbar region: Secondary | ICD-10-CM | POA: Diagnosis not present

## 2020-10-06 DIAGNOSIS — M9904 Segmental and somatic dysfunction of sacral region: Secondary | ICD-10-CM | POA: Diagnosis not present

## 2020-10-22 DIAGNOSIS — M9903 Segmental and somatic dysfunction of lumbar region: Secondary | ICD-10-CM | POA: Diagnosis not present

## 2020-10-22 DIAGNOSIS — M9901 Segmental and somatic dysfunction of cervical region: Secondary | ICD-10-CM | POA: Diagnosis not present

## 2020-10-22 DIAGNOSIS — M9904 Segmental and somatic dysfunction of sacral region: Secondary | ICD-10-CM | POA: Diagnosis not present

## 2020-10-22 DIAGNOSIS — M5413 Radiculopathy, cervicothoracic region: Secondary | ICD-10-CM | POA: Diagnosis not present

## 2020-11-12 ENCOUNTER — Encounter: Payer: Self-pay | Admitting: Gastroenterology

## 2020-11-13 ENCOUNTER — Ambulatory Visit
Admission: RE | Admit: 2020-11-13 | Discharge: 2020-11-13 | Disposition: A | Discharge status: Home / Self Care | Payer: BC Managed Care – PPO | Attending: Gastroenterology | Admitting: Gastroenterology

## 2020-11-13 ENCOUNTER — Encounter
Admission: RE | Disposition: A | Discharge status: Home / Self Care | Payer: Self-pay | Source: Home / Self Care | Attending: Gastroenterology

## 2020-11-13 ENCOUNTER — Encounter: Payer: Self-pay | Admitting: Gastroenterology

## 2020-11-13 ENCOUNTER — Ambulatory Visit: Payer: BC Managed Care – PPO | Admitting: Registered Nurse

## 2020-11-13 ENCOUNTER — Other Ambulatory Visit: Payer: Self-pay

## 2020-11-13 DIAGNOSIS — K635 Polyp of colon: Secondary | ICD-10-CM | POA: Diagnosis not present

## 2020-11-13 DIAGNOSIS — Z87891 Personal history of nicotine dependence: Secondary | ICD-10-CM | POA: Insufficient documentation

## 2020-11-13 DIAGNOSIS — Z8042 Family history of malignant neoplasm of prostate: Secondary | ICD-10-CM | POA: Insufficient documentation

## 2020-11-13 DIAGNOSIS — Z791 Long term (current) use of non-steroidal anti-inflammatories (NSAID): Secondary | ICD-10-CM | POA: Insufficient documentation

## 2020-11-13 DIAGNOSIS — E119 Type 2 diabetes mellitus without complications: Secondary | ICD-10-CM | POA: Diagnosis not present

## 2020-11-13 DIAGNOSIS — Z86010 Personal history of colonic polyps: Secondary | ICD-10-CM

## 2020-11-13 DIAGNOSIS — Z803 Family history of malignant neoplasm of breast: Secondary | ICD-10-CM | POA: Insufficient documentation

## 2020-11-13 DIAGNOSIS — K5730 Diverticulosis of large intestine without perforation or abscess without bleeding: Secondary | ICD-10-CM | POA: Insufficient documentation

## 2020-11-13 DIAGNOSIS — D124 Benign neoplasm of descending colon: Secondary | ICD-10-CM | POA: Diagnosis not present

## 2020-11-13 DIAGNOSIS — Z881 Allergy status to other antibiotic agents status: Secondary | ICD-10-CM | POA: Diagnosis not present

## 2020-11-13 DIAGNOSIS — K648 Other hemorrhoids: Secondary | ICD-10-CM | POA: Insufficient documentation

## 2020-11-13 DIAGNOSIS — Z1211 Encounter for screening for malignant neoplasm of colon: Secondary | ICD-10-CM | POA: Diagnosis not present

## 2020-11-13 DIAGNOSIS — D125 Benign neoplasm of sigmoid colon: Secondary | ICD-10-CM | POA: Insufficient documentation

## 2020-11-13 DIAGNOSIS — Z79899 Other long term (current) drug therapy: Secondary | ICD-10-CM | POA: Insufficient documentation

## 2020-11-13 DIAGNOSIS — Z8249 Family history of ischemic heart disease and other diseases of the circulatory system: Secondary | ICD-10-CM | POA: Diagnosis not present

## 2020-11-13 DIAGNOSIS — Z800 Family history of malignant neoplasm of digestive organs: Secondary | ICD-10-CM | POA: Diagnosis not present

## 2020-11-13 HISTORY — PX: COLONOSCOPY WITH PROPOFOL: SHX5780

## 2020-11-13 LAB — GLUCOSE, CAPILLARY: Glucose-Capillary: 107 mg/dL — ABNORMAL HIGH (ref 70–99)

## 2020-11-13 SURGERY — COLONOSCOPY WITH PROPOFOL
Anesthesia: General

## 2020-11-13 MED ORDER — PROPOFOL 10 MG/ML IV BOLUS
INTRAVENOUS | Status: DC | PRN
  Administered 2020-11-13: 10 mg via INTRAVENOUS

## 2020-11-13 MED ORDER — PROPOFOL 500 MG/50ML IV EMUL
INTRAVENOUS | Status: DC | PRN
  Administered 2020-11-13: 150 ug/kg/min via INTRAVENOUS

## 2020-11-13 MED ORDER — SODIUM CHLORIDE 0.9 % IV SOLN: INTRAVENOUS | Status: DC

## 2020-11-13 MED ORDER — LIDOCAINE HCL (CARDIAC) PF 100 MG/5ML IV SOSY
PREFILLED_SYRINGE | INTRAVENOUS | Status: DC | PRN
  Administered 2020-11-13: 40 mg via INTRAVENOUS

## 2020-11-13 MED ORDER — PROPOFOL 500 MG/50ML IV EMUL
INTRAVENOUS | Status: AC
  Filled 2020-11-13: qty 50

## 2020-11-13 NOTE — Transfer of Care (Signed)
Immediate Anesthesia Transfer of Care Note  Patient: Isaiah Doyle.  Procedure(s) Performed: Procedure(s): COLONOSCOPY WITH PROPOFOL (N/A)  Patient Location: PACU and Endoscopy Unit  Anesthesia Type:General  Level of Consciousness: sedated  Airway & Oxygen Therapy: Patient Spontanous Breathing and Patient connected to nasal cannula oxygen  Post-op Assessment: Report given to RN and Post -op Vital signs reviewed and stable  Post vital signs: Reviewed and stable  Last Vitals:  Vitals:   11/13/20 0845 11/13/20 1043  BP: 132/85 118/72  Pulse: 63 76  Resp: 17 (!) 21  Temp: (!) 36.1 C (!) 36.3 C  SpO2: 051% 83%    Complications: No apparent anesthesia complications

## 2020-11-13 NOTE — Op Note (Signed)
[facility] Gastroenterology Patient Name: Isaiah Doyle Procedure Date: 11/13/2020 8:55 AM MRN: [id number] Account #: [id number] Date of Birth: 01-01-1971 Admit Type: Outpatient Age: 50 Room: [facility] ENDO ROOM 4 Gender: Male Note Status: Finalized Instrument Name: Park Meo [id number] Procedure:             Colonoscopy Indications:           High risk colon cancer surveillance: Personal history                         of colonic polyps Providers:             Emylie Amster B. Bonna Gains MD, MD Referring MD:          Olin Hauser (Referring MD) Medicines:             Monitored Anesthesia Care Complications:         No immediate complications. Procedure:             Pre-Anesthesia Assessment:                        - ASA Grade Assessment: II - A patient with mild                         systemic disease.                        - Prior to the procedure, a History and Physical was                         performed, and patient medications, allergies and                         sensitivities were reviewed. The patient's tolerance                         of previous anesthesia was reviewed.                        - The risks and benefits of the procedure and the                         sedation options and risks were discussed with the                         patient. All questions were answered and informed                         consent was obtained.                        - Patient identification and proposed procedure were                         verified prior to the procedure by the physician, the                         nurse, the anesthesiologist, the anesthetist and the  technician. The procedure was verified in the                         procedure room.                        After obtaining informed consent, the colonoscope was                         passed under direct vision. Throughout the procedure,                         the  patient's blood pressure, pulse, and oxygen                         saturations were monitored continuously. The                         Colonoscope was introduced through the anus and                         advanced to the the cecum, identified by appendiceal                         orifice and ileocecal valve. The colonoscopy was                         performed with ease. The patient tolerated the                         procedure well. The quality of the bowel preparation                         was good except the ascending colon was fair and the                         cecum was fair. Water and suction used to clean the                         colon well. Findings:      The perianal and digital rectal examinations were normal.      A 6 mm polyp was found in the descending colon. The polyp was sessile.       The polyp was removed with a cold snare. Resection and retrieval were       complete.      A post polypectomy scar was found at 50 cm proximal to the anus. The       scar tissue was healthy in appearance. There was no evidence of the       previous polyp. This was seen at the tattoo site.      Two sessile polyps were found in the sigmoid colon. The polyps were 3 to       4 mm in size. These polyps were removed with a cold biopsy forceps.       Resection and retrieval were complete.      Multiple diverticula were found in the sigmoid colon.      The exam was otherwise without abnormality.      The rectum, sigmoid colon, descending colon,  transverse colon, ascending       colon and cecum appeared normal.      Non-bleeding internal hemorrhoids were found during retroflexion. Impression:            - One 6 mm polyp in the descending colon, removed with                         a cold snare. Resected and retrieved.                        - Post-polypectomy scar at 50 cm proximal to the anus.                        - Two 3 to 4 mm polyps in the sigmoid colon, removed                          with a cold biopsy forceps. Resected and retrieved.                        - Diverticulosis in the sigmoid colon.                        - The examination was otherwise normal.                        - The rectum, sigmoid colon, descending colon,                         transverse colon, ascending colon and cecum are normal.                        - Non-bleeding internal hemorrhoids. Recommendation:        - Discharge patient to home (with escort).                        - Advance diet as tolerated.                        - Continue present medications.                        - Await pathology results.                        - Repeat colonoscopy date to be determined after                         pending pathology results are reviewed.                        - The findings and recommendations were discussed with                         the patient.                        - The findings and recommendations were discussed with                         the patient's family.                        -  Return to primary care physician as previously                         scheduled.                        - High fiber diet. Procedure Code(s):     --- Professional ---                        [phone], Colonoscopy, flexible; with removal of                         tumor(s), polyp(s), or other lesion(s) by snare                         technique                        45380, 109, Colonoscopy, flexible; with biopsy, single                         or multiple Diagnosis Code(s):     --- Professional ---                        K63.5, Polyp of colon                        Z86.010, Personal history of colonic polyps                        Z98.890, Other specified postprocedural states CPT copyright 2019 American Medical Association. All rights reserved. The codes documented in this report are preliminary and upon coder review may  be revised to meet current compliance requirements.  Vonda Antigua, MD Margretta Sidle B. Bonna Gains MD, MD 11/13/2020 10:46:19 AM This report has been signed electronically. Number of Addenda: 0 Note Initiated On: 11/13/2020 8:55 AM Scope Withdrawal Time: 0 hours 29 minutes 17 seconds  Total Procedure Duration: 0 hours 34 minutes 30 seconds  Estimated Blood Loss:  Estimated blood loss: none.      [facility]

## 2020-11-13 NOTE — H&P (Signed)
Vonda Antigua, MD [address], [place], [place], [place], [address] [address], [place], [place], [place], [address] Phone: [phone]  Fax: [phone]  Primary Care Physician:  Olin Hauser, DO   Pre-Procedure History & Physical: HPI:  Isaiah Lankford. is a 50 y.o. male is here for a colonoscopy.   Past Medical History:  Diagnosis Date   Allergy    Diabetes mellitus without complication ([place])    GERD (gastroesophageal reflux disease)     Past Surgical History:  Procedure Laterality Date   COLONOSCOPY     COLONOSCOPY WITH PROPOFOL N/A 11/01/2019   Procedure: COLONOSCOPY WITH PROPOFOL;  Surgeon: Virgel Manifold, MD;  Location: [facility] ENDOSCOPY;  Service: Endoscopy;  Laterality: N/A;   ESOPHAGOGASTRODUODENOSCOPY (EGD) WITH PROPOFOL N/A 11/13/2014   Procedure: ESOPHAGOGASTRODUODENOSCOPY (EGD) WITH PROPOFOL;  Surgeon: Hulen Luster, MD;  Location: [facility] ENDOSCOPY;  Service: Gastroenterology;  Laterality: N/A;   TONSILLECTOMY     WRIST MASS EXCISION      Prior to Admission medications   Medication Sig Start Date End Date Taking? Authorizing Provider  acetaminophen (TYLENOL) 500 MG tablet Take 500 mg by mouth every 6 (six) hours as needed.   Yes [provider]  ibuprofen (ADVIL,MOTRIN) 200 MG tablet Take 200 mg by mouth every 6 (six) hours as needed.   Yes [provider]  cetirizine (ZYRTEC) 10 MG tablet Take 10 mg by mouth as needed for allergies.    [provider]  Multiple Vitamin (MULTIVITAMIN) tablet Take 1 tablet by mouth daily.    [provider]  olmesartan (BENICAR) 5 MG tablet Take 1 tablet (5 mg total) by mouth daily. 10/01/20   Karamalegos, Devonne Doughty, DO  omeprazole (PRILOSEC) 20 MG capsule TAKE 1 CAPSULE (20 MG TOTAL) BY MOUTH DAILY BEFORE BREAKFAST. 10/13/19   Karamalegos, Alexander J, DO  OZEMPIC, 1 MG/DOSE, 4 MG/3ML SOPN Inject 1 mg into the skin once a week. 04/24/20   Karamalegos, Devonne Doughty, DO   polyethylene glycol-electrolytes (NULYTELY) 420 g solution Prepare according to package instructions. Starting at 5:00 PM: Drink one 8 oz glass of mixture every 15 minutes until you finish half of the jug. Five hours prior to procedure, drink 8 oz glass of mixture every 15 minutes until it is all gone. Make sure you do not drink anything 4 hours prior to your procedure. Patient not taking: Reported on 11/13/2020 10/01/20   Virgel Manifold, MD    Allergies as of 10/01/2020 - Review Complete 10/01/2020  Allergen Reaction Noted   Erythromycin Nausea And Vomiting 11/10/2014    Family History  Problem Relation Age of Onset   Cancer Mother        BREAST   Heart attack Father 70       57 repeat   Heart disease Father 23   Colon cancer Paternal Grandfather 40   Prostate cancer Neg Hx     Social History   Socioeconomic History   Marital status: Married    Spouse name: Not on file   Number of children: Not on file   Years of education: Not on file   Highest education level: Not on file  Occupational History   Not on file  Tobacco Use   Smoking status: Former    Types: Cigarettes    Quit date: 01/25/2008    Years since quitting: 12.8   Smokeless tobacco: Former   Tobacco comments:    Initially quit 2001, then resumed temporarily 2010  Vaping  Use   Vaping Use: Never used  Substance and Sexual Activity   Alcohol use: Yes    Alcohol/week: 12.0 standard drinks    Types: 12 Cans of beer per week    Comment: a few shots of liquor in evening, none in the last week   Drug use: No   Sexual activity: Not on file  Other Topics Concern   Not on file  Social History Narrative   Not on file   Social Determinants of Health   Financial Resource Strain: Not on file  Food Insecurity: Not on file  Transportation Needs: Not on file  Physical Activity: Not on file  Stress: Not on file  Social Connections: Not on file  Intimate Partner Violence: Not on file    Review of Systems: See  HPI, otherwise negative ROS  Physical Exam: Constitutional: General:   Alert,  Well-developed, well-nourished, pleasant and cooperative in NAD BP 132/85   Pulse 63   Temp (!) 96.9 F (36.1 C) (Temporal)   Resp 17   Ht 6\' 3"  (1.905 m)   Wt 106.6 kg   SpO2 100%   BMI 29.37 kg/m   Head: Normocephalic, atraumatic.   Eyes:  Sclera clear, no icterus.   Conjunctiva pink.   Mouth:  No deformity or lesions, oropharynx pink & moist.  Neck:  Supple, trachea midline  Respiratory: Normal respiratory effort  Gastrointestinal:  Soft, non-tender and non-distended without masses, hepatosplenomegaly or hernias noted.  No guarding or rebound tenderness.     Cardiac: No clubbing or edema.  No cyanosis. Normal posterior tibial pedal pulses noted.  Lymphatic:  No significant cervical adenopathy.  Psych:  Alert and cooperative. Normal mood and affect.  Musculoskeletal:   Symmetrical without gross deformities. 5/5 Lower extremity strength bilaterally.  Skin: Warm. Intact without significant lesions or rashes. No jaundice.  Neurologic:  Face symmetrical, tongue midline, Normal sensation to touch;  grossly normal neurologically.  Psych:  Alert and oriented x3, Alert and cooperative. Normal mood and affect.  Impression/Plan: Isaiah Hensen. is here for a colonoscopy to be performed for history of polyps and piecemeal polypectomy in 2021.  Risks, benefits, limitations, and alternatives regarding  colonoscopy have been reviewed with the patient.  Questions have been answered.  All parties agreeable.   Virgel Manifold, MD  11/13/2020, 9:11 AM

## 2020-11-13 NOTE — Anesthesia Postprocedure Evaluation (Signed)
Anesthesia Post Note  Patient: Isaiah Doyle.  Procedure(s) Performed: COLONOSCOPY WITH PROPOFOL  Patient location during evaluation: PACU Anesthesia Type: General Level of consciousness: awake, awake and alert and oriented Pain management: pain level controlled Vital Signs Assessment: post-procedure vital signs reviewed and stable Respiratory status: nonlabored ventilation Cardiovascular status: stable Anesthetic complications: no   No notable events documented.   Last Vitals:  Vitals:   11/13/20 1043 11/13/20 1103  BP: 118/72 (!) 130/92  Pulse: 76   Resp: (!) 21   Temp: (!) 36.3 C   SpO2: 98%     Last Pain:  Vitals:   11/13/20 1103  TempSrc:   PainSc: 0-No pain                 VAN STAVEREN,Tykeisha Peer

## 2020-11-13 NOTE — Anesthesia Procedure Notes (Signed)
Date/Time: 11/13/2020 9:59 AM Performed by: Doreen Salvage, CRNA Pre-anesthesia Checklist: Patient identified, Emergency Drugs available, Suction available and Patient being monitored Patient Re-evaluated:Patient Re-evaluated prior to induction Oxygen Delivery Method: Nasal cannula Induction Type: IV induction Dental Injury: Teeth and Oropharynx as per pre-operative assessment  Comments: Nasal cannula with etCO2 monitoring

## 2020-11-13 NOTE — Anesthesia Preprocedure Evaluation (Signed)
Anesthesia Evaluation  Patient identified by MRN, date of birth, ID band Patient awake    Reviewed: Allergy & Precautions, NPO status , Patient's Chart, lab work & pertinent test results  Airway Mallampati: II  TM Distance: >3 FB Neck ROM: full    Dental no notable dental hx. (+) Teeth Intact   Pulmonary neg pulmonary ROS, former smoker,    Pulmonary exam normal breath sounds clear to auscultation       Cardiovascular Exercise Tolerance: Good hypertension, Pt. on medications negative cardio ROS Normal cardiovascular exam Rhythm:Regular Rate:Normal     Neuro/Psych negative neurological ROS  negative psych ROS   GI/Hepatic negative GI ROS, Neg liver ROS, GERD  ,  Endo/Other  negative endocrine ROSdiabetes, Well Controlled, Type 2  Renal/GU negative Renal ROS  negative genitourinary   Musculoskeletal negative musculoskeletal ROS (+)   Abdominal Normal abdominal exam  (+)   Peds negative pediatric ROS (+)  Hematology negative hematology ROS (+)   Anesthesia Other Findings Past Medical History: No date: Allergy No date: Diabetes mellitus without complication (HCC) No date: GERD (gastroesophageal reflux disease)  Past Surgical History: No date: COLONOSCOPY 11/01/2019: COLONOSCOPY WITH PROPOFOL; N/A     Comment:  Procedure: COLONOSCOPY WITH PROPOFOL;  Surgeon:               Virgel Manifold, MD;  Location: [facility] ENDOSCOPY;                Service: Endoscopy;  Laterality: N/A; 11/13/2014: ESOPHAGOGASTRODUODENOSCOPY (EGD) WITH PROPOFOL; N/A     Comment:  Procedure: ESOPHAGOGASTRODUODENOSCOPY (EGD) WITH               PROPOFOL;  Surgeon: Hulen Luster, MD;  Location: [facility]               ENDOSCOPY;  Service: Gastroenterology;  Laterality: N/A; No date: TONSILLECTOMY No date: WRIST MASS EXCISION  BMI    Body Mass Index: 29.37 kg/m      Reproductive/Obstetrics negative OB ROS                              Anesthesia Physical Anesthesia Plan  ASA: 2  Anesthesia Plan: General   Post-op Pain Management:    Induction: Intravenous  PONV Risk Score and Plan: Propofol infusion and TIVA  Airway Management Planned: Nasal Cannula  Additional Equipment:   Intra-op Plan:   Post-operative Plan:   Informed Consent: I have reviewed the patients History and Physical, chart, labs and discussed the procedure including the risks, benefits and alternatives for the proposed anesthesia with the patient or authorized representative who has indicated his/her understanding and acceptance.     Dental Advisory Given  Plan Discussed with: CRNA and Surgeon  Anesthesia Plan Comments:         Anesthesia Quick Evaluation

## 2020-11-15 ENCOUNTER — Encounter: Payer: Self-pay | Admitting: Gastroenterology

## 2020-11-16 LAB — SURGICAL PATHOLOGY

## 2020-11-17 ENCOUNTER — Encounter: Payer: Self-pay | Admitting: Gastroenterology

## 2020-12-08 ENCOUNTER — Other Ambulatory Visit: Payer: Self-pay | Admitting: Family Medicine

## 2020-12-08 DIAGNOSIS — K219 Gastro-esophageal reflux disease without esophagitis: Secondary | ICD-10-CM

## 2020-12-08 NOTE — Telephone Encounter (Signed)
Requested Prescriptions  Pending Prescriptions Disp Refills  . omeprazole (PRILOSEC) 20 MG capsule [Pharmacy Med Name: OMEPRAZOLE DR 20 MG CAPSULE] 90 capsule 2    Sig: TAKE 1 CAPSULE (20 MG TOTAL) BY MOUTH DAILY BEFORE BREAKFAST.     Gastroenterology: Proton Pump Inhibitors Passed - 12/08/2020  1:31 AM      Passed - Valid encounter within last 12 months    Recent Outpatient Visits          2 months ago Annual physical exam   [facility], Devonne Doughty, DO   8 months ago Type 2 diabetes mellitus with other specified complication, without long-term current use of insulin [facility])   [facility], Devonne Doughty, DO   11 months ago Type 2 diabetes mellitus with other specified complication, without long-term current use of insulin ([place])   [facility] Olin Hauser, DO   1 year ago Annual physical exam   [facility] Olin Hauser, DO   1 year ago Type 2 diabetes mellitus with other specified complication, without long-term current use of insulin ([place])   [facility] [place], Devonne Doughty, DO      Future Appointments            In 1 month [place], Devonne Doughty, DO [facility], [facility]

## 2020-12-24 LAB — HM DIABETES EYE EXAM

## 2020-12-30 ENCOUNTER — Other Ambulatory Visit: Payer: Self-pay

## 2020-12-30 DIAGNOSIS — I10 Essential (primary) hypertension: Secondary | ICD-10-CM

## 2020-12-30 DIAGNOSIS — E1169 Type 2 diabetes mellitus with other specified complication: Secondary | ICD-10-CM

## 2020-12-30 DIAGNOSIS — Z8349 Family history of other endocrine, nutritional and metabolic diseases: Secondary | ICD-10-CM

## 2020-12-31 ENCOUNTER — Other Ambulatory Visit: Payer: Self-pay

## 2020-12-31 ENCOUNTER — Other Ambulatory Visit: Payer: BC Managed Care – PPO

## 2020-12-31 DIAGNOSIS — Z8349 Family history of other endocrine, nutritional and metabolic diseases: Secondary | ICD-10-CM | POA: Diagnosis not present

## 2020-12-31 DIAGNOSIS — E1169 Type 2 diabetes mellitus with other specified complication: Secondary | ICD-10-CM | POA: Diagnosis not present

## 2020-12-31 DIAGNOSIS — M25531 Pain in right wrist: Secondary | ICD-10-CM | POA: Diagnosis not present

## 2020-12-31 DIAGNOSIS — I10 Essential (primary) hypertension: Secondary | ICD-10-CM | POA: Diagnosis not present

## 2021-01-01 DIAGNOSIS — S52521D Torus fracture of lower end of right radius, subsequent encounter for fracture with routine healing: Secondary | ICD-10-CM | POA: Diagnosis not present

## 2021-01-01 LAB — COMPLETE METABOLIC PANEL WITH GFR
AG Ratio: 1.5 (calc) (ref 1.0–2.5)
ALT: 137 U/L — ABNORMAL HIGH (ref 9–46)
AST: 64 U/L — ABNORMAL HIGH (ref 10–35)
Albumin: 4.5 g/dL (ref 3.6–5.1)
Alkaline phosphatase (APISO): 61 U/L (ref 35–144)
BUN: 15 mg/dL (ref 7–25)
CO2: 26 mmol/L (ref 20–32)
Calcium: 9.4 mg/dL (ref 8.6–10.3)
Chloride: 107 mmol/L (ref 98–110)
Creat: 1.06 mg/dL (ref 0.70–1.30)
Globulin: 3.1 g/dL (calc) (ref 1.9–3.7)
Glucose, Bld: 123 mg/dL — ABNORMAL HIGH (ref 65–99)
Potassium: 4.1 mmol/L (ref 3.5–5.3)
Sodium: 142 mmol/L (ref 135–146)
Total Bilirubin: 0.9 mg/dL (ref 0.2–1.2)
Total Protein: 7.6 g/dL (ref 6.1–8.1)
eGFR: 85 mL/min/{1.73_m2} (ref >=60)

## 2021-01-01 LAB — HEMOGLOBIN A1C
Hgb A1c MFr Bld: 6 % of total Hgb — ABNORMAL HIGH (ref <5.7)
Mean Plasma Glucose: 126 mg/dL
eAG (mmol/L): 7 mmol/L

## 2021-01-01 LAB — FERRITIN: Ferritin: 315 ng/mL (ref 38–380)

## 2021-01-07 ENCOUNTER — Encounter: Payer: Self-pay | Admitting: Family Medicine

## 2021-01-07 ENCOUNTER — Other Ambulatory Visit: Payer: Self-pay | Admitting: Family Medicine

## 2021-01-07 ENCOUNTER — Other Ambulatory Visit: Payer: Self-pay

## 2021-01-07 ENCOUNTER — Ambulatory Visit: Payer: BC Managed Care – PPO | Admitting: Family Medicine

## 2021-01-07 VITALS — BP 130/82 | HR 81 | Temp 97.7°F | Resp 17 | Ht 75.0 in | Wt 247.8 lb

## 2021-01-07 DIAGNOSIS — Z0000 Encounter for general adult medical examination without abnormal findings: Secondary | ICD-10-CM

## 2021-01-07 DIAGNOSIS — R7989 Other specified abnormal findings of blood chemistry: Secondary | ICD-10-CM | POA: Diagnosis not present

## 2021-01-07 DIAGNOSIS — Z8349 Family history of other endocrine, nutritional and metabolic diseases: Secondary | ICD-10-CM

## 2021-01-07 DIAGNOSIS — H9313 Tinnitus, bilateral: Secondary | ICD-10-CM

## 2021-01-07 DIAGNOSIS — E1169 Type 2 diabetes mellitus with other specified complication: Secondary | ICD-10-CM

## 2021-01-07 DIAGNOSIS — E785 Hyperlipidemia, unspecified: Secondary | ICD-10-CM

## 2021-01-07 DIAGNOSIS — I10 Essential (primary) hypertension: Secondary | ICD-10-CM

## 2021-01-07 DIAGNOSIS — Z125 Encounter for screening for malignant neoplasm of prostate: Secondary | ICD-10-CM

## 2021-01-07 DIAGNOSIS — R351 Nocturia: Secondary | ICD-10-CM

## 2021-01-07 DIAGNOSIS — K760 Fatty (change of) liver, not elsewhere classified: Secondary | ICD-10-CM

## 2021-01-07 MED ORDER — OZEMPIC (1 MG/DOSE) 4 MG/3ML ~~LOC~~ SOPN: 1.0000 mg | PEN_INJECTOR | SUBCUTANEOUS | 3 refills | Status: DC

## 2021-01-07 NOTE — Patient Instructions (Addendum)
Thank you for coming to the office today.  Elevated LFTs still. Likely due to cholesterol. Goal to improve diet exercise.  Keep an eye on BP if possible.  We may need to try the cholesterol med Statin rx in near future to control the cholesterol.  Return for nurse visit Flu Shot  Refilled Ozempic, keep same dose, great work!  DUE for FASTING BLOOD WORK (no food or drink after midnight before the lab appointment, only water or coffee without cream/sugar on the morning of)  SCHEDULE "Lab Only" visit in the morning at the clinic for lab draw in 9 months  - Make sure Lab Only appointment is at about 1 week before your next appointment, so that results will be available  For Lab Results, once available within 2-3 days of blood draw, you can can log in to MyChart online to view your results and a brief explanation. Also, we can discuss results at next follow-up visit.   Please schedule a Follow-up Appointment to: Return in about 9 months (around 10/08/2021) for 9 month fasting lab only then 1 week later Annual Physical.  If you have any other questions or concerns, please feel free to call the office or send a message through [place]. You may also schedule an earlier appointment if necessary.  Additionally, you may be receiving a survey about your experience at our office within a few days to 1 week by e-mail or mail. We value your feedback.  Nobie Putnam, DO [place]

## 2021-01-07 NOTE — Assessment & Plan Note (Signed)
DM A1c controlled at 6.0 On lifestyle and GLP1 Still goal for wt loss No Hypoglycemia and RESOLVED Hyperglycemia. Complications - hyperlipidemia, GERD - increases risk of future cardiovascular complications   Plan:  Continue Ozempic 1mg  weekly Encourage improved lifestyle - low carb, low sugar diet, reduce portion size, continue improving restart regular exercise On ARB Consider statin therapy will offer

## 2021-01-07 NOTE — Assessment & Plan Note (Signed)
Controlled BP repeat manual Home BP readings limited    Plan:  1. Continue Olmesartan 5mg  daily for HTN and for prevention kidney disease 2. Encourage improved lifestyle - low sodium diet, regular exercise 3. monitor BP outside office, bring readings to next visit, if persistently >140/90 or new symptoms notify office sooner

## 2021-01-07 NOTE — Assessment & Plan Note (Signed)
Chronic problem. Has seen  ENT Dr Tami Ribas in past. No clear diagnosis or management.  Advised he may return to ENT when ready for hearing eval / possible hearing aid

## 2021-01-07 NOTE — Progress Notes (Signed)
Subjective:    Patient ID: Isaiah Doyle., male    DOB: 12-Jul-1970, 50 y.o.   MRN: [id number]  [name]. is a 50 y.o. male presenting on 01/07/2021 for Diabetes (Pt doesn't check his blood sugar, A1C 6.0% x 7 days ago.) and Hypertension   HPI  CHRONIC DM, Type 2: Reports no concerns, now improving with lifestyle changes. CBGs: Avg 100-120, Low >100, High < 150. Checks CBGs x 1 daily fasting AM Meds: Ozempic 12m weekly Off Metformin Reports  good compliance. Tolerating well w/o side-effects Currently not on ACEi/ARB Lifestyle: Weight up - Diet (Similar to last time - high protein options, salads) - Exercise (limited exercise due to time) Diabetic Eye Apt in 12/2020 - dx cataract Left eye. Denies hypoglycemia, polyuria, visual changes, numbness or tingling.   HYPERLIPIDEMIA: - Reports no concerns. Last lipid panel 08/2020, elevated LDL 140s Not on statin therapy  Sister dx Hemochromotosis Last lab shows high normal Ferritin.   Elevated BP without dx HTN He admitted a dizzy spell almost vertigo recently. Not checking home BP, has had some elevation before Fam history of HTN Current Meds - None   Denies CP, dyspnea, HA, edema, dizziness / lightheadedness     History of Kidney Stones Recent had slight back pain but it has resolved. No actual current symptoms. He usually takes Flomax PRN none lately.   Elevated LFTs Fatty Liver, hepatic steatosis  Hearing Loss / Tinnitus He had hearing test by Dr MTami RibasENT Some gradual worsening hearing.    Health Maintenance:   COVID19 not updated yet for booster  Deferred Flu Shot.   Colon CA Screening, last colonoscopy history 11/01/19 with multiple polyps, AGI Dr TBonna Gains advised repeat colonoscopy in 1 year. Now 10/2020 completed repeat with multiple polyps tubular adenoma as well, and repeat again in 1 year. - Noted family history grandfather w/ colon CA  [facility] 01/07/2021 01/02/2020  09/18/2019  Decreased Interest 0 0 0  Down, Depressed, Hopeless 0 0 0  PHQ - 2 Score 0 0 0  Altered sleeping 0 - -  Tired, decreased energy 1 - -  Change in appetite 0 - -  Feeling bad or failure about yourself  0 - -  Trouble concentrating 1 - -  Moving slowly or fidgety/restless 0 - -  Suicidal thoughts 0 - -  PHQ-9 Score 2 - -  Difficult doing work/chores Not difficult at all - -    Social History   Tobacco Use   Smoking status: Former    Types: Cigarettes    Quit date: 01/25/2008    Years since quitting: 12.9   Smokeless tobacco: Former   Tobacco comments:    Initially quit 2001, then resumed temporarily 2010  Vaping Use   Vaping Use: Never used  Substance Use Topics   Alcohol use: Yes    Alcohol/week: 12.0 standard drinks    Types: 12 Cans of beer per week    Comment: a few shots of liquor in evening, none in the last week   Drug use: No    Review of Systems  Constitutional:  Negative for activity change, appetite change, chills, diaphoresis, fatigue and fever.  HENT:  Negative for congestion and hearing loss.   Eyes:  Negative for visual disturbance.  Respiratory:  Negative for cough, chest tightness, shortness of breath and wheezing.   Cardiovascular:  Negative for chest pain, palpitations and leg swelling.  Gastrointestinal:  Negative for abdominal pain, constipation,  diarrhea, nausea and vomiting.  Genitourinary:  Negative for dysuria, frequency and hematuria.  Musculoskeletal:  Negative for arthralgias and neck pain.  Skin:  Negative for rash.  Neurological:  Negative for dizziness, weakness, light-headedness, numbness and headaches.  Hematological:  Negative for adenopathy.  Psychiatric/Behavioral:  Negative for behavioral problems, dysphoric mood and sleep disturbance.   Per HPI unless specifically indicated above     Objective:    BP 130/82 (BP Location: Left Arm, Cuff Size: Normal)    Pulse 81    Temp 97.7 F (36.5 C) (Temporal)    Resp 17    Ht _0   (1.905 m)    Wt 247 lb 12.8 oz (112.4 kg)    SpO2 100%    BMI 30.97 kg/m   Wt Readings from Last 3 Encounters:  01/07/21 247 lb 12.8 oz (112.4 kg)  11/13/20 235 lb (106.6 kg)  10/01/20 244 lb 12.8 oz (111 kg)    Physical Exam Vitals and nursing note reviewed.  Constitutional:      General: He is not in acute distress.    Appearance: Normal appearance. He is well-developed. He is not diaphoretic.     Comments: Well-appearing, comfortable, cooperative  HENT:     Head: Normocephalic and atraumatic.  Eyes:     General:        Right eye: No discharge.        Left eye: No discharge.     Conjunctiva/sclera: Conjunctivae normal.  Cardiovascular:     Rate and Rhythm: Normal rate.  Pulmonary:     Effort: Pulmonary effort is normal.  Skin:    General: Skin is warm and dry.     Findings: No erythema or rash.  Neurological:     Mental Status: He is alert and oriented to person, place, and time.  Psychiatric:        Mood and Affect: Mood normal.        Behavior: Behavior normal.        Thought Content: Thought content normal.     Comments: Well groomed, good eye contact, normal speech and thoughts     Results for orders placed or performed in visit on 12/30/20  COMPLETE METABOLIC PANEL WITH GFR  Result Value Ref Range   Glucose, Bld 123 (H) 65 - 99 mg/dL   BUN 15 7 - 25 mg/dL   Creat 1.06 0.70 - 1.30 mg/dL   eGFR 85 > OR = 60 mL/min/1.31m   BUN/Creatinine Ratio NOT APPLICABLE 6 - 22 (calc)   Sodium 142 135 - 146 mmol/L   Potassium 4.1 3.5 - 5.3 mmol/L   Chloride 107 98 - 110 mmol/L   CO2 26 20 - 32 mmol/L   Calcium 9.4 8.6 - 10.3 mg/dL   Total Protein 7.6 6.1 - 8.1 g/dL   Albumin 4.5 3.6 - 5.1 g/dL   Globulin 3.1 1.9 - 3.7 g/dL (calc)   AG Ratio 1.5 1.0 - 2.5 (calc)   Total Bilirubin 0.9 0.2 - 1.2 mg/dL   Alkaline phosphatase (APISO) 61 35 - 144 U/L   AST 64 (H) 10 - 35 U/L   ALT 137 (H) 9 - 46 U/L  Hemoglobin A1c  Result Value Ref Range   Hgb A1c MFr Bld 6.0 (H) <5.7 %  of total Hgb   Mean Plasma Glucose 126 mg/dL   eAG (mmol/L) 7.0 mmol/L  Ferritin  Result Value Ref Range   Ferritin 315 38 - 380 ng/mL      Assessment &  Plan:   Problem List Items Addressed This Visit     Type 2 diabetes mellitus with other specified complication ([place]) - Primary    DM A1c controlled at 6.0 On lifestyle and GLP1 Still goal for wt loss No Hypoglycemia and RESOLVED Hyperglycemia. Complications - hyperlipidemia, GERD - increases risk of future cardiovascular complications   Plan:  Continue Ozempic 41m weekly Encourage improved lifestyle - low carb, low sugar diet, reduce portion size, continue improving restart regular exercise On ARB Consider statin therapy will offer      Relevant Medications   OZEMPIC, 1 MG/DOSE, 4 MG/3ML SOPN   Tinnitus    Chronic problem. Has seen [place] [facility] Dr MTami Ribasin past. No clear diagnosis or management.  Advised he may return to ENT when ready for hearing eval / possible hearing aid      Hyperlipidemia associated with type 2 diabetes mellitus ([place]    Lipids stable Complication hepatic steatosis, prior Liver [place]  LFT still elevated The 10-year ASCVD risk score (Arnett DK, et al., 2019) is: 10.6%  Plan: 1. Discussed ASCVD risk and DM - offered statin again re-visit when ready, goal for lifestyle improvement first - Repeat follow up on fatty liver disease 2. Encourage improved lifestyle - low carb/cholesterol, reduce portion size, continue improving regular exercise      Relevant Medications   OZEMPIC, 1 MG/DOSE, 4 MG/3ML SOPN   Essential hypertension    Controlled BP repeat manual Home BP readings limited    Plan:  1. Continue Olmesartan 544mdaily for HTN and for prevention kidney disease 2. Encourage improved lifestyle - low sodium diet, regular exercise 3. monitor BP outside office, bring readings to next visit, if persistently >140/90 or new symptoms notify office sooner      Elevated LFTs   Other Visit  Diagnoses     Hepatic steatosis       Family history of hemochromatosis           Ferritin high normal. Not consistent with new dx hemochromatosis  Meds ordered this encounter  Medications   OZEMPIC, 1 MG/DOSE, 4 MG/3ML SOPN    Sig: Inject 1 mg into the skin once a week.    Dispense:  9 mL    Refill:  3    Add extra refills      Follow up plan: Return in about 9 months (around 10/08/2021) for 9 month fasting lab only then 1 week later Annual Physical.  Future labs ordered for 10/01/21   AlNobie PutnamDOLake Loreleiroup 01/07/2021, 8:43 AM

## 2021-01-07 NOTE — Assessment & Plan Note (Signed)
Lipids stable Complication hepatic steatosis, prior Liver US 2011  LFT still elevated The 10-year ASCVD risk score (Arnett DK, et al., 2019) is: 10.6%  Plan: 1. Discussed ASCVD risk and DM - offered statin again re-visit when ready, goal for lifestyle improvement first - Repeat follow up on fatty liver disease 2. Encourage improved lifestyle - low carb/cholesterol, reduce portion size, continue improving regular exercise

## 2021-01-20 ENCOUNTER — Ambulatory Visit (INDEPENDENT_AMBULATORY_CARE_PROVIDER_SITE_OTHER): Payer: BC Managed Care – PPO | Admitting: Family Medicine

## 2021-01-20 ENCOUNTER — Other Ambulatory Visit: Payer: Self-pay

## 2021-01-20 DIAGNOSIS — Z23 Encounter for immunization: Secondary | ICD-10-CM | POA: Diagnosis not present

## 2021-01-27 DIAGNOSIS — S52601D Unspecified fracture of lower end of right ulna, subsequent encounter for closed fracture with routine healing: Secondary | ICD-10-CM | POA: Diagnosis not present

## 2021-01-27 DIAGNOSIS — S52521D Torus fracture of lower end of right radius, subsequent encounter for fracture with routine healing: Secondary | ICD-10-CM | POA: Diagnosis not present

## 2021-02-02 NOTE — Progress Notes (Signed)
Vaccine visit only. This was not an office visit

## 2021-03-04 DIAGNOSIS — M9904 Segmental and somatic dysfunction of sacral region: Secondary | ICD-10-CM | POA: Diagnosis not present

## 2021-03-04 DIAGNOSIS — M9903 Segmental and somatic dysfunction of lumbar region: Secondary | ICD-10-CM | POA: Diagnosis not present

## 2021-03-04 DIAGNOSIS — M9901 Segmental and somatic dysfunction of cervical region: Secondary | ICD-10-CM | POA: Diagnosis not present

## 2021-03-04 DIAGNOSIS — M5413 Radiculopathy, cervicothoracic region: Secondary | ICD-10-CM | POA: Diagnosis not present

## 2021-03-31 DIAGNOSIS — M9901 Segmental and somatic dysfunction of cervical region: Secondary | ICD-10-CM | POA: Diagnosis not present

## 2021-03-31 DIAGNOSIS — M9903 Segmental and somatic dysfunction of lumbar region: Secondary | ICD-10-CM | POA: Diagnosis not present

## 2021-03-31 DIAGNOSIS — M9904 Segmental and somatic dysfunction of sacral region: Secondary | ICD-10-CM | POA: Diagnosis not present

## 2021-03-31 DIAGNOSIS — M5413 Radiculopathy, cervicothoracic region: Secondary | ICD-10-CM | POA: Diagnosis not present

## 2021-04-04 ENCOUNTER — Other Ambulatory Visit: Payer: Self-pay | Admitting: Family Medicine

## 2021-04-04 DIAGNOSIS — I10 Essential (primary) hypertension: Secondary | ICD-10-CM

## 2021-04-05 NOTE — Telephone Encounter (Signed)
Requested Prescriptions  ?Pending Prescriptions Disp Refills  ?? olmesartan (BENICAR) 5 MG tablet [Pharmacy Med Name: OLMESARTAN MEDOXOMIL '5MG'$  TABLETS] 90 tablet 1  ?  Sig: TAKE 1 TABLET(5 MG) BY MOUTH DAILY  ?  ? Cardiovascular:  Angiotensin Receptor Blockers Passed - 04/04/2021 11:34 AM  ?  ?  Passed - Cr in normal range and within 180 days  ?  Creat  ?Date Value Ref Range Status  ?12/31/2020 1.06 0.70 - 1.30 mg/dL Final  ?   ?  ?  Passed - K in normal range and within 180 days  ?  Potassium  ?Date Value Ref Range Status  ?12/31/2020 4.1 3.5 - 5.3 mmol/L Final  ?   ?  ?  Passed - Patient is not pregnant  ?  ?  Passed - Last BP in normal range  ?  BP Readings from Last 1 Encounters:  ?01/07/21 130/82  ?   ?  ?  Passed - Valid encounter within last 6 months  ?  Recent Outpatient Visits   ?      ? 2 months ago Need for immunization against influenza  ? [facility] [place], Devonne Doughty, DO  ? 2 months ago Type 2 diabetes mellitus with other specified complication, without long-term current use of insulin ([place])  ? [place], DO  ? 6 months ago Annual physical exam  ? [place], DO  ? 1 year ago Type 2 diabetes mellitus with other specified complication, without long-term current use of insulin ([place])  ? [place], DO  ? 1 year ago Type 2 diabetes mellitus with other specified complication, without long-term current use of insulin ([place])  ? [place], DO  ?  ?  ?Future Appointments   ?        ? In 6 months [place], Devonne Doughty, DO [facility], [place]  ?  ? ?  ?  ?  ? ?

## 2021-04-06 DIAGNOSIS — M9904 Segmental and somatic dysfunction of sacral region: Secondary | ICD-10-CM | POA: Diagnosis not present

## 2021-04-06 DIAGNOSIS — M9903 Segmental and somatic dysfunction of lumbar region: Secondary | ICD-10-CM | POA: Diagnosis not present

## 2021-04-06 DIAGNOSIS — M5413 Radiculopathy, cervicothoracic region: Secondary | ICD-10-CM | POA: Diagnosis not present

## 2021-04-06 DIAGNOSIS — M9901 Segmental and somatic dysfunction of cervical region: Secondary | ICD-10-CM | POA: Diagnosis not present

## 2021-04-26 DIAGNOSIS — M9903 Segmental and somatic dysfunction of lumbar region: Secondary | ICD-10-CM | POA: Diagnosis not present

## 2021-04-26 DIAGNOSIS — M5413 Radiculopathy, cervicothoracic region: Secondary | ICD-10-CM | POA: Diagnosis not present

## 2021-04-26 DIAGNOSIS — M9901 Segmental and somatic dysfunction of cervical region: Secondary | ICD-10-CM | POA: Diagnosis not present

## 2021-04-26 DIAGNOSIS — M9904 Segmental and somatic dysfunction of sacral region: Secondary | ICD-10-CM | POA: Diagnosis not present

## 2021-05-10 DIAGNOSIS — M9903 Segmental and somatic dysfunction of lumbar region: Secondary | ICD-10-CM | POA: Diagnosis not present

## 2021-05-10 DIAGNOSIS — M9904 Segmental and somatic dysfunction of sacral region: Secondary | ICD-10-CM | POA: Diagnosis not present

## 2021-05-10 DIAGNOSIS — M9901 Segmental and somatic dysfunction of cervical region: Secondary | ICD-10-CM | POA: Diagnosis not present

## 2021-05-10 DIAGNOSIS — M5413 Radiculopathy, cervicothoracic region: Secondary | ICD-10-CM | POA: Diagnosis not present

## 2021-06-22 DIAGNOSIS — M9904 Segmental and somatic dysfunction of sacral region: Secondary | ICD-10-CM | POA: Diagnosis not present

## 2021-06-22 DIAGNOSIS — M5413 Radiculopathy, cervicothoracic region: Secondary | ICD-10-CM | POA: Diagnosis not present

## 2021-06-22 DIAGNOSIS — M9901 Segmental and somatic dysfunction of cervical region: Secondary | ICD-10-CM | POA: Diagnosis not present

## 2021-06-22 DIAGNOSIS — M9903 Segmental and somatic dysfunction of lumbar region: Secondary | ICD-10-CM | POA: Diagnosis not present

## 2021-07-11 ENCOUNTER — Other Ambulatory Visit: Payer: Self-pay | Admitting: Family Medicine

## 2021-07-11 DIAGNOSIS — I10 Essential (primary) hypertension: Secondary | ICD-10-CM

## 2021-07-12 NOTE — Telephone Encounter (Signed)
Requested Prescriptions  Pending Prescriptions Disp Refills  . olmesartan (BENICAR) 5 MG tablet [Pharmacy Med Name: OLMESARTAN MEDOXOMIL '5MG'$  TABLETS] 90 tablet 0    Sig: TAKE 1 TABLET(5 MG) BY MOUTH DAILY     Cardiovascular:  Angiotensin Receptor Blockers Failed - 07/11/2021  7:49 AM      Failed - Cr in normal range and within 180 days    Creat  Date Value Ref Range Status  12/31/2020 1.06 0.70 - 1.30 mg/dL Final         Failed - K in normal range and within 180 days    Potassium  Date Value Ref Range Status  12/31/2020 4.1 3.5 - 5.3 mmol/L Final         Failed - Valid encounter within last 6 months    Recent Outpatient Visits          5 months ago Need for immunization against influenza   [place], DO   6 months ago Type 2 diabetes mellitus with other specified complication, without long-term current use of insulin ([place])   [facility], Devonne Doughty, DO   9 months ago Annual physical exam   [place], Devonne Doughty, DO   1 year ago Type 2 diabetes mellitus with other specified complication, without long-term current use of insulin [facility])   [facility] [place], Devonne Doughty, DO   1 year ago Type 2 diabetes mellitus with other specified complication, without long-term current use of insulin [facility])   [place], DO      Future Appointments            In 2 months [place], Devonne Doughty, DO [facility] [place] - Patient is not pregnant      Passed - Last BP in normal range    BP Readings from Last 1 Encounters:  01/07/21 130/82

## 2021-08-09 DIAGNOSIS — M9903 Segmental and somatic dysfunction of lumbar region: Secondary | ICD-10-CM | POA: Diagnosis not present

## 2021-08-09 DIAGNOSIS — M9904 Segmental and somatic dysfunction of sacral region: Secondary | ICD-10-CM | POA: Diagnosis not present

## 2021-08-09 DIAGNOSIS — M5413 Radiculopathy, cervicothoracic region: Secondary | ICD-10-CM | POA: Diagnosis not present

## 2021-08-09 DIAGNOSIS — M9901 Segmental and somatic dysfunction of cervical region: Secondary | ICD-10-CM | POA: Diagnosis not present

## 2021-08-17 ENCOUNTER — Telehealth: Payer: Self-pay

## 2021-08-17 NOTE — Telephone Encounter (Signed)
LVM returning patients call to schedule him for his repeat colonoscopy.  Last colonoscopy was performed with Dr. Bonna Gains on 11/13/20.  She noted the following findings:   -One 6 mm polyp in the descending colon, removed with a cold snare. - Two 3 to 4 mm polyps in the sigmoid colon, removed with a cold biopsy forceps.   Patient requested colonoscopy to be scheduled on 11/17/21.  I will wait for call back to complete triage and schedule with another provider since Dr. Bonna Gains is no longer at our practice.  Dx: History of colon polyps.  Patient requested 2 day prep as this is noted on the results letter 11/17/20.  Nulytely Bowel Prep used with last colonoscopy.  Thanks, [place], [place]

## 2021-08-26 ENCOUNTER — Other Ambulatory Visit: Payer: Self-pay

## 2021-08-26 ENCOUNTER — Telehealth: Payer: Self-pay

## 2021-08-26 DIAGNOSIS — Z86010 Personal history of colonic polyps: Secondary | ICD-10-CM

## 2021-08-26 MED ORDER — NA SULFATE-K SULFATE-MG SULF 17.5-3.13-1.6 GM/177ML PO SOLN: 2.0000 | Freq: Once | ORAL | 0 refills | Status: AC

## 2021-08-26 NOTE — Telephone Encounter (Signed)
Colonoscopy has been rescheduled from 11/17/21 to 11/19/21 with Dr. Vicente Males instead of Dr. Marius Ditch.  Isaiah Doyle and Isaiah Doyle  in Endo has been informed of date change.  Thanks [place], [place]

## 2021-08-26 NOTE — Telephone Encounter (Signed)
Patients call has been returned to schedule his repeat colonoscopy. He originally requested his colonoscopy to be scheduled on 11/17/21 since we have been missing each others call returns I've scheduled him for 11/17/21.  Gastroenterology Pre-Procedure Review  Request Date: 11/17/21 Requesting Physician: Dr. Marius Ditch  PATIENT REVIEW QUESTIONS: The triage completed by Sharyn Lull, CMA by chart revieiw:  1. Are you having any GI issues? no 2. Do you have a personal history of Polyps? yes (11/13/20 performed by Dr. Bonna Gains polyps noted) 3. Do you have a family history of Colon Cancer or Polyps? no 4. Diabetes Mellitus? Yes type 2 5. Joint replacements in the past 12 months?no 6. Major health problems in the past 3 months?no 7. Any artificial heart valves, MVP, or defibrillator?no    MEDICATIONS & ALLERGIES:    Patient reports the following regarding taking any anticoagulation/antiplatelet therapy:   Plavix, Coumadin, Eliquis, Xarelto, Lovenox, Pradaxa, Brilinta, or Effient? no Aspirin? no  Patient confirms/reports the following medications:  Current Outpatient Medications  Medication Sig Dispense Refill   acetaminophen (TYLENOL) 500 MG tablet Take 500 mg by mouth every 6 (six) hours as needed.     cetirizine (ZYRTEC) 10 MG tablet Take 10 mg by mouth as needed for allergies.     ibuprofen (ADVIL,MOTRIN) 200 MG tablet Take 200 mg by mouth every 6 (six) hours as needed.     Multiple Vitamin (MULTIVITAMIN) tablet Take 1 tablet by mouth daily.     Na Sulfate-K Sulfate-Mg Sulf 17.5-3.13-1.6 GM/177ML SOLN Take 2 kits by mouth once for 1 dose. Use as directed on Colonoscopy Instructions Received in mail and mychart. 718 mL 0   olmesartan (BENICAR) 5 MG tablet TAKE 1 TABLET(5 MG) BY MOUTH DAILY 90 tablet 0   omeprazole (PRILOSEC) 20 MG capsule TAKE 1 CAPSULE (20 MG TOTAL) BY MOUTH DAILY BEFORE BREAKFAST. 90 capsule 2   OZEMPIC, 1 MG/DOSE, 4 MG/3ML SOPN Inject 1 mg into the skin once a week. 9 mL 3    No current facility-administered medications for this visit.    Patient confirms/reports the following allergies:  Allergies  Allergen Reactions   Erythromycin Nausea And Vomiting    No orders of the defined types were placed in this encounter.   AUTHORIZATION INFORMATION Primary Insurance: 1D#: Group #:  Secondary Insurance: 1D#: Group #:  SCHEDULE INFORMATION: Date:  Time: Location:   Thanks, Sharyn Lull, CMA

## 2021-08-30 DIAGNOSIS — M9901 Segmental and somatic dysfunction of cervical region: Secondary | ICD-10-CM | POA: Diagnosis not present

## 2021-08-30 DIAGNOSIS — M9904 Segmental and somatic dysfunction of sacral region: Secondary | ICD-10-CM | POA: Diagnosis not present

## 2021-08-30 DIAGNOSIS — M5413 Radiculopathy, cervicothoracic region: Secondary | ICD-10-CM | POA: Diagnosis not present

## 2021-08-30 DIAGNOSIS — M9903 Segmental and somatic dysfunction of lumbar region: Secondary | ICD-10-CM | POA: Diagnosis not present

## 2021-09-30 ENCOUNTER — Other Ambulatory Visit: Payer: Self-pay

## 2021-09-30 DIAGNOSIS — R351 Nocturia: Secondary | ICD-10-CM

## 2021-09-30 DIAGNOSIS — E1169 Type 2 diabetes mellitus with other specified complication: Secondary | ICD-10-CM

## 2021-09-30 DIAGNOSIS — K760 Fatty (change of) liver, not elsewhere classified: Secondary | ICD-10-CM

## 2021-09-30 DIAGNOSIS — Z8349 Family history of other endocrine, nutritional and metabolic diseases: Secondary | ICD-10-CM

## 2021-09-30 DIAGNOSIS — Z125 Encounter for screening for malignant neoplasm of prostate: Secondary | ICD-10-CM

## 2021-09-30 DIAGNOSIS — I10 Essential (primary) hypertension: Secondary | ICD-10-CM

## 2021-09-30 DIAGNOSIS — R7989 Other specified abnormal findings of blood chemistry: Secondary | ICD-10-CM

## 2021-09-30 DIAGNOSIS — Z0000 Encounter for general adult medical examination without abnormal findings: Secondary | ICD-10-CM

## 2021-10-01 ENCOUNTER — Other Ambulatory Visit: Payer: BC Managed Care – PPO

## 2021-10-01 DIAGNOSIS — E1169 Type 2 diabetes mellitus with other specified complication: Secondary | ICD-10-CM | POA: Diagnosis not present

## 2021-10-01 DIAGNOSIS — E785 Hyperlipidemia, unspecified: Secondary | ICD-10-CM | POA: Diagnosis not present

## 2021-10-01 DIAGNOSIS — R351 Nocturia: Secondary | ICD-10-CM | POA: Diagnosis not present

## 2021-10-01 DIAGNOSIS — I10 Essential (primary) hypertension: Secondary | ICD-10-CM | POA: Diagnosis not present

## 2021-10-01 DIAGNOSIS — Z125 Encounter for screening for malignant neoplasm of prostate: Secondary | ICD-10-CM | POA: Diagnosis not present

## 2021-10-02 LAB — CBC WITH DIFFERENTIAL/PLATELET
Absolute Monocytes: 665 cells/uL (ref 200–950)
Basophils Absolute: 63 cells/uL (ref 0–200)
Basophils Relative: 0.9 %
Eosinophils Absolute: 133 cells/uL (ref 15–500)
Eosinophils Relative: 1.9 %
HCT: 42.7 % (ref 38.5–50.0)
Hemoglobin: 14.9 g/dL (ref 13.2–17.1)
Lymphs Abs: 2037 cells/uL (ref 850–3900)
MCH: 30.8 pg (ref 27.0–33.0)
MCHC: 34.9 g/dL (ref 32.0–36.0)
MCV: 88.2 fL (ref 80.0–100.0)
MPV: 10.2 fL (ref 7.5–12.5)
Monocytes Relative: 9.5 %
Neutro Abs: 4102 cells/uL (ref 1500–7800)
Neutrophils Relative %: 58.6 %
Platelets: 189 10*3/uL (ref 140–400)
RBC: 4.84 10*6/uL (ref 4.20–5.80)
RDW: 12.3 % (ref 11.0–15.0)
Total Lymphocyte: 29.1 %
WBC: 7 10*3/uL (ref 3.8–10.8)

## 2021-10-02 LAB — COMPLETE METABOLIC PANEL WITH GFR
AG Ratio: 1.6 (calc) (ref 1.0–2.5)
ALT: 140 U/L — ABNORMAL HIGH (ref 9–46)
AST: 62 U/L — ABNORMAL HIGH (ref 10–35)
Albumin: 4.6 g/dL (ref 3.6–5.1)
Alkaline phosphatase (APISO): 64 U/L (ref 35–144)
BUN: 13 mg/dL (ref 7–25)
CO2: 25 mmol/L (ref 20–32)
Calcium: 9.5 mg/dL (ref 8.6–10.3)
Chloride: 106 mmol/L (ref 98–110)
Creat: 1.11 mg/dL (ref 0.70–1.30)
Globulin: 2.8 g/dL (calc) (ref 1.9–3.7)
Glucose, Bld: 121 mg/dL — ABNORMAL HIGH (ref 65–99)
Potassium: 4 mmol/L (ref 3.5–5.3)
Sodium: 141 mmol/L (ref 135–146)
Total Bilirubin: 0.7 mg/dL (ref 0.2–1.2)
Total Protein: 7.4 g/dL (ref 6.1–8.1)
eGFR: 80 mL/min/{1.73_m2} (ref >=60)

## 2021-10-02 LAB — PSA: PSA: 0.25 ng/mL (ref <=4.00)

## 2021-10-02 LAB — LIPID PANEL
Cholesterol: 217 mg/dL — ABNORMAL HIGH (ref <200)
HDL: 43 mg/dL (ref >=40)
LDL Cholesterol (Calc): 130 mg/dL (calc) — ABNORMAL HIGH
Non-HDL Cholesterol (Calc): 174 mg/dL (calc) — ABNORMAL HIGH (ref <130)
Total CHOL/HDL Ratio: 5 (calc) — ABNORMAL HIGH (ref <5.0)
Triglycerides: 309 mg/dL — ABNORMAL HIGH (ref <150)

## 2021-10-02 LAB — HEMOGLOBIN A1C
Hgb A1c MFr Bld: 5.9 % of total Hgb — ABNORMAL HIGH (ref <5.7)
Mean Plasma Glucose: 123 mg/dL
eAG (mmol/L): 6.8 mmol/L

## 2021-10-08 ENCOUNTER — Ambulatory Visit (INDEPENDENT_AMBULATORY_CARE_PROVIDER_SITE_OTHER): Payer: BC Managed Care – PPO | Admitting: Family Medicine

## 2021-10-08 ENCOUNTER — Encounter: Payer: Self-pay | Admitting: Family Medicine

## 2021-10-08 VITALS — BP 142/86 | HR 62 | Ht 75.0 in | Wt 243.8 lb

## 2021-10-08 DIAGNOSIS — E785 Hyperlipidemia, unspecified: Secondary | ICD-10-CM

## 2021-10-08 DIAGNOSIS — E1169 Type 2 diabetes mellitus with other specified complication: Secondary | ICD-10-CM | POA: Diagnosis not present

## 2021-10-08 DIAGNOSIS — Z0000 Encounter for general adult medical examination without abnormal findings: Secondary | ICD-10-CM | POA: Diagnosis not present

## 2021-10-08 DIAGNOSIS — I10 Essential (primary) hypertension: Secondary | ICD-10-CM

## 2021-10-08 DIAGNOSIS — Z23 Encounter for immunization: Secondary | ICD-10-CM

## 2021-10-08 NOTE — Patient Instructions (Addendum)
Thank you for coming to the office today.  You have completed your Annual Physical today.  Lipid Panel     Component Value Date/Time   CHOL 217 (H) 10/01/2021 0808   CHOL 174 04/21/2015 0812   TRIG 309 (H) 10/01/2021 0808   HDL 43 10/01/2021 0808   HDL 47 04/21/2015 0812   CHOLHDL 5.0 (H) 10/01/2021 0808   LDLCALC 130 (H) 10/01/2021 0808   LABVLDL 55 (H) 04/21/2015 0812     Chemistry      Component Value Date/Time   NA 141 10/01/2021 0808   NA 142 04/21/2015 0812   K 4.0 10/01/2021 0808   CL 106 10/01/2021 0808   CO2 25 10/01/2021 0808   BUN 13 10/01/2021 0808   BUN 19 04/21/2015 0812   CREATININE 1.11 10/01/2021 0808      Component Value Date/Time   CALCIUM 9.5 10/01/2021 0808   ALKPHOS 69 04/21/2015 0812   AST 62 (H) 10/01/2021 0808   ALT 140 (H) 10/01/2021 0808   BILITOT 0.7 10/01/2021 0808   BILITOT 0.8 04/21/2015 0812     Recent Labs    12/31/20 0758 10/01/21 0808  HGBA1C 6.0* 5.9*     Try the Flomax 0.'4mg'$  capsule once daily either morning or evening with meal. For urinary symptoms. Could be enlarged prostate.  If it works well or you prefer a new order, let me know.  Flu Shot today  BP improved, still 140s, if you can check your BP occasionally at home, and keep improving lifestyle we can monitor going forward.  Remain off Olmesartan for now.  Elevated Liver enzymes still, similar to last year. Likely from Cholesterol. We can check labs again and ultrasound next time.  Will order Liver US before apt. If you can notify me 1 month before apt and I can order it.  DUE for FASTING BLOOD WORK (no food or drink after midnight before the lab appointment, only water or coffee without cream/sugar on the morning of)  SCHEDULE "Lab Only" visit in the morning at the clinic for lab draw in 6 MONTHS   - Make sure Lab Only appointment is at about 1 week before your next appointment, so that results will be available  For Lab Results, once available within 2-3  days of blood draw, you can can log in to MyChart online to view your results and a brief explanation. Also, we can discuss results at next follow-up visit.   Please schedule a Follow-up Appointment to: Return in about 6 months (around 04/08/2022) for 6 month fasting lab only then 1 week later Follow-up DM, HTN, LFTs.  If you have any other questions or concerns, please feel free to call the office or send a message through [place]. You may also schedule an earlier appointment if necessary.  Additionally, you may be receiving a survey about your experience at our office within a few days to 1 week by e-mail or mail. We value your feedback.  Nobie Putnam, DO [place]

## 2021-10-08 NOTE — Assessment & Plan Note (Signed)
Lipids stable, LDL down to 130s, but still high TG w diet Complication hepatic steatosis, prior Liver US 2011  LFT still elevated The 10-year ASCVD risk score (Arnett DK, et al., 2019) is: 12.3%  Plan: 1. Discussed ASCVD risk and DM - offered statin again re-visit when ready, goal for lifestyle improvement first - Repeat follow up on fatty liver disease 2. Encourage improved lifestyle - low carb/cholesterol, reduce portion size, continue improving regular exercise

## 2021-10-08 NOTE — Assessment & Plan Note (Signed)
DM A1c controlled at 5.9 On lifestyle and GLP1 No Hypoglycemia and RESOLVED Hyperglycemia. Complications - hyperlipidemia, GERD - increases risk of future cardiovascular complications   Plan:  Continue Ozempic '1mg'$  weekly Encourage improved lifestyle - low carb, low sugar diet, reduce portion size, continue improving restart regular exercise Off ARB due to dizziness Consider statin therapy will offer

## 2021-10-08 NOTE — Progress Notes (Unsigned)
Subjective:    Patient ID: Isaiah Hensen., male    DOB: 08/24/1970, 51 y.o.   MRN: [id number]  Isaiah Posten. is a 51 y.o. male presenting on 10/08/2021 for Annual Exam   HPI  Here for Annual Physical and Lab Review  CHRONIC DM, Type 2: Reports no concerns, now improving with lifestyle changes. CBGs: Avg 100-120, Low >100, High < 150. Checks CBGs x 1 daily fasting AM Last result A1c 5.9 Meds: Ozempic '1mg'$  weekly Off Metformin Reports  good compliance. Tolerating well w/o side-effects Currently not on ACEi/ARB Lifestyle: Weight up - Diet (Similar to last time - high protein options, salads) - Exercise (limited exercise due to time) Diabetic Eye exam done 12/2020 Denies hypoglycemia, polyuria, visual changes, numbness or tingling.   HYPERLIPIDEMIA: - Reports no concerns. Last lipid panel 09/2021 improved LDL 146 to 130. Still elevated TG with sweets in diet Not on statin therapy   Sister dx Hemochromotosis Last lab shows high normal Ferritin. Normal CBC   Essential Hypertension He admitted a dizzy spell almost vertigo recently. Not checking home BP, has had some elevation before Fam history of HTN Major stressors at work Current Meds - OFF Olmesartan '5mg'$  daily due to dizziness.   Denies CP, dyspnea, HA, edema, dizziness / lightheadedness     History of Kidney Stones Recent had slight back pain but it has resolved. No actual current symptoms. He usually takes Flomax PRN none lately.   Elevated LFTs Fatty Liver, hepatic steatosis   Hearing Loss / Tinnitus He had hearing test by Dr Tami Ribas ENT Some gradual worsening hearing.     Health Maintenance:   COVID19 not updated yet for booster   Deferred Flu Shot.   Colon CA Screening, last colonoscopy history 11/01/19 with multiple polyps, AGI Dr Bonna Gains, advised repeat colonoscopy in 1 year. Now 10/2020 completed repeat with multiple polyps tubular adenoma as well, and repeat again in 1 year. - Noted family  history grandfather w/ colon CA - Scheduled Oct 2023 for rpt  PSA 0.25 (09/2021)      10/08/2021    8:17 AM 01/07/2021    8:32 AM 01/02/2020    9:01 AM  Depression screen PHQ 2/9  Decreased Interest 0 0 0  Down, Depressed, Hopeless 0 0 0  PHQ - 2 Score 0 0 0  Altered sleeping 0 0   Tired, decreased energy 0 1   Change in appetite 0 0   Feeling bad or failure about yourself  0 0   Trouble concentrating 0 1   Moving slowly or fidgety/restless 0 0   Suicidal thoughts 0 0   PHQ-9 Score 0 2   Difficult doing work/chores Not difficult at all Not difficult at all     Past Medical History:  Diagnosis Date   Allergy    Diabetes mellitus without complication ([place])    GERD (gastroesophageal reflux disease)    Past Surgical History:  Procedure Laterality Date   COLONOSCOPY     COLONOSCOPY WITH PROPOFOL N/A 11/01/2019   Procedure: COLONOSCOPY WITH PROPOFOL;  Surgeon: Virgel Manifold, MD;  Location: [facility] ENDOSCOPY;  Service: Endoscopy;  Laterality: N/A;   COLONOSCOPY WITH PROPOFOL N/A 11/13/2020   Procedure: COLONOSCOPY WITH PROPOFOL;  Surgeon: Virgel Manifold, MD;  Location: [facility] ENDOSCOPY;  Service: Gastroenterology;  Laterality: N/A;   ESOPHAGOGASTRODUODENOSCOPY (EGD) WITH PROPOFOL N/A 11/13/2014   Procedure: ESOPHAGOGASTRODUODENOSCOPY (EGD) WITH PROPOFOL;  Surgeon: Hulen Luster, MD;  Location: [facility] ENDOSCOPY;  Service: Gastroenterology;  Laterality: N/A;   TONSILLECTOMY     WRIST MASS EXCISION     Social History   Socioeconomic History   Marital status: Married    Spouse name: Not on file   Number of children: Not on file   Years of education: Not on file   Highest education level: Not on file  Occupational History   Not on file  Tobacco Use   Smoking status: Former    Types: Cigarettes    Quit date: 01/25/2008    Years since quitting: 13.7   Smokeless tobacco: Former   Tobacco comments:    Initially quit 2001, then resumed temporarily 2010  Vaping Use   Vaping  Use: Never used  Substance and Sexual Activity   Alcohol use: Yes    Alcohol/week: 12.0 standard drinks of alcohol    Types: 12 Cans of beer per week    Comment: a few shots of liquor in evening, none in the last week   Drug use: No   Sexual activity: Not on file  Other Topics Concern   Not on file  Social History Narrative   Not on file   Social Determinants of Health   Financial Resource Strain: Not on file  Food Insecurity: Not on file  Transportation Needs: Not on file  Physical Activity: Not on file  Stress: Not on file  Social Connections: Not on file  Intimate Partner Violence: Not on file   Family History  Problem Relation Age of Onset   Cancer Mother        BREAST   Heart attack Father 18       57 repeat   Heart disease Father 68   Colon cancer Paternal Grandfather 63   Prostate cancer Neg Hx    Current Outpatient Medications on File Prior to Visit  Medication Sig   acetaminophen (TYLENOL) 500 MG tablet Take 500 mg by mouth every 6 (six) hours as needed.   cetirizine (ZYRTEC) 10 MG tablet Take 10 mg by mouth as needed for allergies.   ibuprofen (ADVIL,MOTRIN) 200 MG tablet Take 200 mg by mouth every 6 (six) hours as needed.   Multiple Vitamin (MULTIVITAMIN) tablet Take 1 tablet by mouth daily.   omeprazole (PRILOSEC) 20 MG capsule TAKE 1 CAPSULE (20 MG TOTAL) BY MOUTH DAILY BEFORE BREAKFAST.   OZEMPIC, 1 MG/DOSE, 4 MG/3ML SOPN Inject 1 mg into the skin once a week.   No current facility-administered medications on file prior to visit.    Review of Systems  Constitutional:  Negative for activity change, appetite change, chills, diaphoresis, fatigue and fever.  HENT:  Negative for congestion and hearing loss.   Eyes:  Negative for visual disturbance.  Respiratory:  Negative for cough, chest tightness, shortness of breath and wheezing.   Cardiovascular:  Negative for chest pain, palpitations and leg swelling.  Gastrointestinal:  Negative for abdominal pain,  constipation, diarrhea, nausea and vomiting.  Genitourinary:  Negative for dysuria, frequency and hematuria.  Musculoskeletal:  Negative for arthralgias and neck pain.  Skin:  Negative for rash.  Neurological:  Negative for dizziness, weakness, light-headedness, numbness and headaches.  Hematological:  Negative for adenopathy.  Psychiatric/Behavioral:  Negative for behavioral problems, dysphoric mood and sleep disturbance.    Per HPI unless specifically indicated above      Objective:    BP (!) 142/86 (BP Location: Left Arm, Cuff Size: Normal)   Pulse 62   Ht '6\' 3"'$  (1.905 m)   Wt 243 lb 12.8 oz (  110.6 kg)   SpO2 98%   BMI 30.47 kg/m   Wt Readings from Last 3 Encounters:  10/08/21 243 lb 12.8 oz (110.6 kg)  01/07/21 247 lb 12.8 oz (112.4 kg)  11/13/20 235 lb (106.6 kg)    Physical Exam Vitals and nursing note reviewed.  Constitutional:      General: He is not in acute distress.    Appearance: He is well-developed. He is not diaphoretic.     Comments: Well-appearing, comfortable, cooperative  HENT:     Head: Normocephalic and atraumatic.  Eyes:     General:        Right eye: No discharge.        Left eye: No discharge.     Conjunctiva/sclera: Conjunctivae normal.     Pupils: Pupils are equal, round, and reactive to light.  Neck:     Thyroid: No thyromegaly.     Vascular: No carotid bruit.  Cardiovascular:     Rate and Rhythm: Normal rate and regular rhythm.     Pulses: Normal pulses.     Heart sounds: Normal heart sounds. No murmur heard. Pulmonary:     Effort: Pulmonary effort is normal. No respiratory distress.     Breath sounds: Normal breath sounds. No wheezing or rales.  Abdominal:     General: Bowel sounds are normal. There is no distension.     Palpations: Abdomen is soft. There is no mass.     Tenderness: There is no abdominal tenderness.  Musculoskeletal:        General: No tenderness. Normal range of motion.     Cervical back: Normal range of motion and  neck supple.     Right lower leg: No edema.     Left lower leg: No edema.     Comments: Upper / Lower Extremities: - Normal muscle tone, strength bilateral upper extremities 5/5, lower extremities 5/5  Lymphadenopathy:     Cervical: No cervical adenopathy.  Skin:    General: Skin is warm and dry.     Findings: No erythema or rash.  Neurological:     Mental Status: He is alert and oriented to person, place, and time.     Comments: Distal sensation intact to light touch all extremities  Psychiatric:        Mood and Affect: Mood normal.        Behavior: Behavior normal.        Thought Content: Thought content normal.     Comments: Well groomed, good eye contact, normal speech and thoughts    Diabetic Foot Exam - Simple   Simple Foot Form Diabetic Foot exam was performed with the following findings: Yes 10/08/2021  8:31 AM  Visual Inspection No deformities, no ulcerations, no other skin breakdown bilaterally: Yes Sensation Testing Intact to touch and monofilament testing bilaterally: Yes Pulse Check Posterior Tibialis and Dorsalis pulse intact bilaterally: Yes Comments        Results for orders placed or performed in visit on 10/08/21  Urine Microalbumin w/creat. ratio  Result Value Ref Range   Creatinine, Urine 144 20 - 320 mg/dL   Microalb, Ur 0.4 mg/dL   Microalb Creat Ratio 3 <30 mcg/mg creat      Assessment & Plan:   Problem List Items Addressed This Visit     Essential hypertension    Repeat manual blood pressure improved but still elevated Home BP readings limited    Plan:  1. Remains off low dose ARB due to dizziness 2.  Encourage improved lifestyle - low sodium diet, regular exercise 3. monitor BP outside office, bring readings to next visit, if persistently >140/90 or new symptoms notify office sooner      Hyperlipidemia associated with type 2 diabetes mellitus ([place])    Lipids stable, LDL down to 130s, but still high TG w diet Complication hepatic  steatosis, prior Liver US 2011  LFT still elevated The 10-year ASCVD risk score (Arnett DK, et al., 2019) is: 12.3%  Plan: 1. Discussed ASCVD risk and DM - offered statin again re-visit when ready, goal for lifestyle improvement first - Repeat follow up on fatty liver disease 2. Encourage improved lifestyle - low carb/cholesterol, reduce portion size, continue improving regular exercise      Type 2 diabetes mellitus with other specified complication (HCC)    DM A1c controlled at 5.9 On lifestyle and GLP1 No Hypoglycemia and RESOLVED Hyperglycemia. Complications - hyperlipidemia, GERD - increases risk of future cardiovascular complications   Plan:  Continue Ozempic '1mg'$  weekly Encourage improved lifestyle - low carb, low sugar diet, reduce portion size, continue improving restart regular exercise Off ARB due to dizziness Consider statin therapy will offer      Relevant Orders   Urine Microalbumin w/creat. ratio (Completed)   Other Visit Diagnoses     Annual physical exam    -  Primary   Needs flu shot       Relevant Orders   Flu Vaccine QUAD 27moIM (Fluarix, Fluzone & Alfiuria Quad PF) (Completed)       Updated Health Maintenance information Reviewed recent lab results with patient Encouraged improvement to lifestyle with diet and exercise Goal of weight loss  #Elevated Liver Enzymes #Hepatic steasosi Ordered hepatic function panel 6 months and RUQ Liver [place] consider referral to GI  Has Colonoscopy scheduled 10/2021  Flu Shot today   No orders of the defined types were placed in this encounter.     Follow up plan: Return in about 6 months (around 04/08/2022) for 6 month fasting lab only then 1 week later Follow-up DM, HTN, LFTs.  Future 6 months repeat labs lipids CMET + Liver [place] ANobie Putnam DO [facility] 10/08/2021, 8:11 AM

## 2021-10-09 ENCOUNTER — Other Ambulatory Visit: Payer: Self-pay | Admitting: Family Medicine

## 2021-10-09 DIAGNOSIS — R7989 Other specified abnormal findings of blood chemistry: Secondary | ICD-10-CM

## 2021-10-09 DIAGNOSIS — E1169 Type 2 diabetes mellitus with other specified complication: Secondary | ICD-10-CM

## 2021-10-09 DIAGNOSIS — K760 Fatty (change of) liver, not elsewhere classified: Secondary | ICD-10-CM

## 2021-10-09 LAB — MICROALBUMIN / CREATININE URINE RATIO
Creatinine, Urine: 144 mg/dL (ref 20–320)
Microalb Creat Ratio: 3 mcg/mg creat (ref <30)
Microalb, Ur: 0.4 mg/dL

## 2021-10-09 NOTE — Assessment & Plan Note (Signed)
Repeat manual blood pressure improved but still elevated Home BP readings limited    Plan:  1. Remains off low dose ARB due to dizziness 2. Encourage improved lifestyle - low sodium diet, regular exercise 3. monitor BP outside office, bring readings to next visit, if persistently >140/90 or new symptoms notify office sooner

## 2021-10-14 ENCOUNTER — Ambulatory Visit
Admission: RE | Admit: 2021-10-14 | Discharge: 2021-10-14 | Disposition: A | Discharge status: Home / Self Care | Payer: BC Managed Care – PPO | Source: Ambulatory Visit | Attending: Family Medicine | Admitting: Family Medicine

## 2021-10-14 DIAGNOSIS — R7989 Other specified abnormal findings of blood chemistry: Secondary | ICD-10-CM | POA: Diagnosis not present

## 2021-10-14 DIAGNOSIS — R945 Abnormal results of liver function studies: Secondary | ICD-10-CM | POA: Diagnosis not present

## 2021-10-14 DIAGNOSIS — K760 Fatty (change of) liver, not elsewhere classified: Secondary | ICD-10-CM

## 2021-10-16 ENCOUNTER — Other Ambulatory Visit: Payer: Self-pay | Admitting: Family Medicine

## 2021-10-16 DIAGNOSIS — I10 Essential (primary) hypertension: Secondary | ICD-10-CM

## 2021-10-17 ENCOUNTER — Other Ambulatory Visit: Payer: Self-pay | Admitting: Family Medicine

## 2021-10-17 DIAGNOSIS — K219 Gastro-esophageal reflux disease without esophagitis: Secondary | ICD-10-CM

## 2021-10-18 DIAGNOSIS — M5413 Radiculopathy, cervicothoracic region: Secondary | ICD-10-CM | POA: Diagnosis not present

## 2021-10-18 DIAGNOSIS — M9904 Segmental and somatic dysfunction of sacral region: Secondary | ICD-10-CM | POA: Diagnosis not present

## 2021-10-18 DIAGNOSIS — M9901 Segmental and somatic dysfunction of cervical region: Secondary | ICD-10-CM | POA: Diagnosis not present

## 2021-10-18 DIAGNOSIS — M9903 Segmental and somatic dysfunction of lumbar region: Secondary | ICD-10-CM | POA: Diagnosis not present

## 2021-10-18 NOTE — Telephone Encounter (Signed)
Future visit in 5 months .  Requested Prescriptions  Pending Prescriptions Disp Refills  . omeprazole (PRILOSEC) 20 MG capsule [Pharmacy Med Name: OMEPRAZOLE DR 20 MG CAPSULE] 90 capsule 1    Sig: TAKE 1 CAPSULE BY MOUTH DAILY BEFORE BREAKFAST.     Gastroenterology: Proton Pump Inhibitors Passed - 10/17/2021  1:16 AM      Passed - Valid encounter within last 12 months    Recent Outpatient Visits          1 week ago Annual physical exam   [facility] Olin Hauser, DO   9 months ago Need for immunization against influenza   [facility] [place], Devonne Doughty, DO   9 months ago Type 2 diabetes mellitus with other specified complication, without long-term current use of insulin [facility])   [place], DO   1 year ago Annual physical exam   [facility] Olin Hauser, DO   1 year ago Type 2 diabetes mellitus with other specified complication, without long-term current use of insulin ([place])   [facility] [place], Devonne Doughty, DO      Future Appointments            In 5 months [place], Devonne Doughty, DO [facility]

## 2021-10-18 NOTE — Telephone Encounter (Signed)
Requested medication (s) are due for refill today - no  Requested medication (s) are on the active medication list -no  Future visit scheduled -yes  Last refill: 07/12/21  Notes to clinic: Medication no longer listed on current medication list  Requested Prescriptions  Pending Prescriptions Disp Refills   olmesartan (BENICAR) 5 MG tablet [Pharmacy Med Name: OLMESARTAN MEDOXOMIL '5MG'$  TABLETS] 90 tablet 0    Sig: TAKE 1 TABLET(5 MG) BY MOUTH DAILY     Cardiovascular:  Angiotensin Receptor Blockers Failed - 10/16/2021  3:40 AM      Failed - Last BP in normal range    BP Readings from Last 1 Encounters:  10/08/21 (!) 142/86         Passed - Cr in normal range and within 180 days    Creat  Date Value Ref Range Status  10/01/2021 1.11 0.70 - 1.30 mg/dL Final   Creatinine, Urine  Date Value Ref Range Status  10/08/2021 144 20 - 320 mg/dL Final         Passed - K in normal range and within 180 days    Potassium  Date Value Ref Range Status  10/01/2021 4.0 3.5 - 5.3 mmol/L Final         Passed - Patient is not pregnant      Passed - Valid encounter within last 6 months    Recent Outpatient Visits           1 week ago Annual physical exam   [place], DO   9 months ago Need for immunization against influenza   [place], DO   9 months ago Type 2 diabetes mellitus with other specified complication, without long-term current use of insulin ([place])   [place], Devonne Doughty, DO   1 year ago Annual physical exam   [facility] Olin Hauser, DO   1 year ago Type 2 diabetes mellitus with other specified complication, without long-term current use of insulin ([place])   [place], DO       Future Appointments             In 5 months [place], Devonne Doughty, DO [facility], [facility]                Requested Prescriptions  Pending Prescriptions Disp Refills   olmesartan (BENICAR) 5 MG tablet [Pharmacy Med Name: OLMESARTAN MEDOXOMIL '5MG'$  TABLETS] 90 tablet 0    Sig: TAKE 1 TABLET(5 MG) BY MOUTH DAILY     Cardiovascular:  Angiotensin Receptor Blockers Failed - 10/16/2021  3:40 AM      Failed - Last BP in normal range    BP Readings from Last 1 Encounters:  10/08/21 (!) 142/86         Passed - Cr in normal range and within 180 days    Creat  Date Value Ref Range Status  10/01/2021 1.11 0.70 - 1.30 mg/dL Final   Creatinine, Urine  Date Value Ref Range Status  10/08/2021 144 20 - 320 mg/dL Final         Passed - K in normal range and within 180 days    Potassium  Date Value Ref Range Status  10/01/2021 4.0 3.5 - 5.3 mmol/L Final         Passed - Patient is not pregnant      Passed - Valid encounter  within last 6 months    Recent Outpatient Visits           1 week ago Annual physical exam   [facility] Olin Hauser, DO   9 months ago Need for immunization against influenza   [facility], Devonne Doughty, DO   9 months ago Type 2 diabetes mellitus with other specified complication, without long-term current use of insulin [facility])   [place], DO   1 year ago Annual physical exam   [facility] Olin Hauser, DO   1 year ago Type 2 diabetes mellitus with other specified complication, without long-term current use of insulin ([place])   [facility] [place], Devonne Doughty, DO       Future Appointments             In 5 months [place], Devonne Doughty, DO [facility], [facility]

## 2021-11-10 DIAGNOSIS — M9901 Segmental and somatic dysfunction of cervical region: Secondary | ICD-10-CM | POA: Diagnosis not present

## 2021-11-10 DIAGNOSIS — M9903 Segmental and somatic dysfunction of lumbar region: Secondary | ICD-10-CM | POA: Diagnosis not present

## 2021-11-10 DIAGNOSIS — M9904 Segmental and somatic dysfunction of sacral region: Secondary | ICD-10-CM | POA: Diagnosis not present

## 2021-11-10 DIAGNOSIS — M5413 Radiculopathy, cervicothoracic region: Secondary | ICD-10-CM | POA: Diagnosis not present

## 2021-11-11 ENCOUNTER — Telehealth: Payer: Self-pay

## 2021-11-11 NOTE — Telephone Encounter (Signed)
Patients colonoscopy date was originally scheduled 11/17/21 with Dr. Vicente Males. He rescheduled on 08/03 to 11/19/21 however the date was not changed in Endo.  Hadley Pen were both notified on 08/26/21.  Vikki has added patient to Dr. Georgeann Oppenheim schedule for 11/19/21.  Thanks, [place], [place]

## 2021-11-19 ENCOUNTER — Ambulatory Visit: Payer: BC Managed Care – PPO | Admitting: Anesthesiology

## 2021-11-19 ENCOUNTER — Ambulatory Visit
Admission: RE | Admit: 2021-11-19 | Discharge: 2021-11-19 | Disposition: A | Discharge status: Home / Self Care | Payer: BC Managed Care – PPO | Attending: Gastroenterology | Admitting: Gastroenterology

## 2021-11-19 ENCOUNTER — Encounter: Payer: Self-pay | Admitting: Gastroenterology

## 2021-11-19 ENCOUNTER — Encounter
Admission: RE | Disposition: A | Discharge status: Home / Self Care | Payer: Self-pay | Source: Home / Self Care | Attending: Gastroenterology

## 2021-11-19 DIAGNOSIS — I10 Essential (primary) hypertension: Secondary | ICD-10-CM | POA: Diagnosis not present

## 2021-11-19 DIAGNOSIS — D122 Benign neoplasm of ascending colon: Secondary | ICD-10-CM | POA: Insufficient documentation

## 2021-11-19 DIAGNOSIS — Z86010 Personal history of colonic polyps: Secondary | ICD-10-CM | POA: Insufficient documentation

## 2021-11-19 DIAGNOSIS — Z7985 Long-term (current) use of injectable non-insulin antidiabetic drugs: Secondary | ICD-10-CM | POA: Insufficient documentation

## 2021-11-19 DIAGNOSIS — D126 Benign neoplasm of colon, unspecified: Secondary | ICD-10-CM | POA: Diagnosis not present

## 2021-11-19 DIAGNOSIS — Z87891 Personal history of nicotine dependence: Secondary | ICD-10-CM | POA: Insufficient documentation

## 2021-11-19 DIAGNOSIS — Z09 Encounter for follow-up examination after completed treatment for conditions other than malignant neoplasm: Secondary | ICD-10-CM | POA: Diagnosis not present

## 2021-11-19 DIAGNOSIS — K219 Gastro-esophageal reflux disease without esophagitis: Secondary | ICD-10-CM | POA: Diagnosis not present

## 2021-11-19 DIAGNOSIS — E119 Type 2 diabetes mellitus without complications: Secondary | ICD-10-CM | POA: Diagnosis not present

## 2021-11-19 DIAGNOSIS — K635 Polyp of colon: Secondary | ICD-10-CM | POA: Diagnosis not present

## 2021-11-19 HISTORY — PX: COLONOSCOPY WITH PROPOFOL: SHX5780

## 2021-11-19 LAB — GLUCOSE, CAPILLARY: Glucose-Capillary: 105 mg/dL — ABNORMAL HIGH (ref 70–99)

## 2021-11-19 SURGERY — COLONOSCOPY WITH PROPOFOL
Anesthesia: General

## 2021-11-19 MED ORDER — PROPOFOL 500 MG/50ML IV EMUL
INTRAVENOUS | Status: DC | PRN
  Administered 2021-11-19: 150 ug/kg/min via INTRAVENOUS

## 2021-11-19 MED ORDER — SODIUM CHLORIDE 0.9 % IV SOLN
INTRAVENOUS | Status: DC
  Administered 2021-11-19: 1000 mL via INTRAVENOUS

## 2021-11-19 MED ORDER — PROPOFOL 10 MG/ML IV BOLUS
INTRAVENOUS | Status: DC | PRN
  Administered 2021-11-19: 30 mg via INTRAVENOUS
  Administered 2021-11-19: 70 mg via INTRAVENOUS
  Administered 2021-11-19: 20 mg via INTRAVENOUS
  Administered 2021-11-19: 10 mg via INTRAVENOUS

## 2021-11-19 MED ORDER — STERILE WATER FOR IRRIGATION IR SOLN
Status: DC | PRN
  Administered 2021-11-19 (×2): 60 mL

## 2021-11-19 NOTE — Anesthesia Preprocedure Evaluation (Signed)
Anesthesia Evaluation  Patient identified by MRN, date of birth, ID band Patient awake    Reviewed: Allergy & Precautions, NPO status , Patient's Chart, lab work & pertinent test results  History of Anesthesia Complications Negative for: history of anesthetic complications  Airway Mallampati: III  TM Distance: <3 FB Neck ROM: full    Dental  (+) Chipped   Pulmonary neg shortness of breath, former smoker,    Pulmonary exam normal        Cardiovascular Exercise Tolerance: Good hypertension, (-) anginaNormal cardiovascular exam     Neuro/Psych negative neurological ROS  negative psych ROS   GI/Hepatic Neg liver ROS, GERD  Controlled,  Endo/Other  diabetes, Type 2  Renal/GU negative Renal ROS  negative genitourinary   Musculoskeletal   Abdominal   Peds  Hematology negative hematology ROS (+)   Anesthesia Other Findings Past Medical History: No date: Allergy No date: Diabetes mellitus without complication (HCC) No date: GERD (gastroesophageal reflux disease)  Past Surgical History: No date: COLONOSCOPY 11/01/2019: COLONOSCOPY WITH PROPOFOL; N/A     Comment:  Procedure: COLONOSCOPY WITH PROPOFOL;  Surgeon:               Virgel Manifold, MD;  Location: [facility] ENDOSCOPY;                Service: Endoscopy;  Laterality: N/A; 11/13/2020: COLONOSCOPY WITH PROPOFOL; N/A     Comment:  Procedure: COLONOSCOPY WITH PROPOFOL;  Surgeon:               Virgel Manifold, MD;  Location: [facility] ENDOSCOPY;                Service: Gastroenterology;  Laterality: N/A; 11/13/2014: ESOPHAGOGASTRODUODENOSCOPY (EGD) WITH PROPOFOL; N/A     Comment:  Procedure: ESOPHAGOGASTRODUODENOSCOPY (EGD) WITH               PROPOFOL;  Surgeon: Hulen Luster, MD;  Location: [facility]               ENDOSCOPY;  Service: Gastroenterology;  Laterality: N/A; No date: TONSILLECTOMY No date: WRIST MASS EXCISION     Reproductive/Obstetrics negative OB ROS                              Anesthesia Physical Anesthesia Plan  ASA: 3  Anesthesia Plan: General   Post-op Pain Management:    Induction: Intravenous  PONV Risk Score and Plan: Propofol infusion and TIVA  Airway Management Planned: Natural Airway and Nasal Cannula  Additional Equipment:   Intra-op Plan:   Post-operative Plan:   Informed Consent: I have reviewed the patients History and Physical, chart, labs and discussed the procedure including the risks, benefits and alternatives for the proposed anesthesia with the patient or authorized representative who has indicated his/her understanding and acceptance.     Dental Advisory Given  Plan Discussed with: Anesthesiologist, CRNA and Surgeon  Anesthesia Plan Comments: (Patient consented for risks of anesthesia including but not limited to:  - adverse reactions to medications - risk of airway placement if required - damage to eyes, teeth, lips or other oral mucosa - nerve damage due to positioning  - sore throat or hoarseness - Damage to heart, brain, nerves, lungs, other parts of body or loss of life  Patient voiced understanding.)        Anesthesia Quick Evaluation

## 2021-11-19 NOTE — Anesthesia Postprocedure Evaluation (Signed)
Anesthesia Post Note  Patient: Isaiah Doyle.  Procedure(s) Performed: COLONOSCOPY WITH PROPOFOL  Patient location during evaluation: Endoscopy Anesthesia Type: General Level of consciousness: awake and alert Pain management: pain level controlled Vital Signs Assessment: post-procedure vital signs reviewed and stable Respiratory status: spontaneous breathing, nonlabored ventilation, respiratory function stable and patient connected to nasal cannula oxygen Cardiovascular status: blood pressure returned to baseline and stable Postop Assessment: no apparent nausea or vomiting Anesthetic complications: no   No notable events documented.   Last Vitals:  Vitals:   11/19/21 0834 11/19/21 0844  BP: 120/85 (!) 134/93  Pulse: 69 70  Resp: 19 19  Temp: (!) 35.8 C   SpO2: 98% 98%    Last Pain:  Vitals:   11/19/21 0844  TempSrc:   PainSc: 0-No pain                 Precious Haws Jacqulyne Gladue

## 2021-11-19 NOTE — Transfer of Care (Signed)
Immediate Anesthesia Transfer of Care Note  Patient: Isaiah Doyle.  Procedure(s) Performed: COLONOSCOPY WITH PROPOFOL  Patient Location: PACU  Anesthesia Type:General  Level of Consciousness: drowsy  Airway & Oxygen Therapy: Patient Spontanous Breathing  Post-op Assessment: Report given to RN and Post -op Vital signs reviewed and stable  Post vital signs: Reviewed and stable  Last Vitals:  Vitals Value Taken Time  BP    Temp    Pulse    Resp    SpO2      Last Pain:  Vitals:   11/19/21 0753  TempSrc: Temporal  PainSc: 0-No pain         Complications: No notable events documented.

## 2021-11-19 NOTE — H&P (Signed)
Isaiah Bellows, MD [address], [place], [place], [place], [address] [address], [place], [place], [place], [address] Phone: [phone]  Fax: [phone]  Primary Care Physician:  Olin Hauser, DO   Pre-Procedure History & Physical: HPI:  Isaiah Denham. is a 51 y.o. male is here for an colonoscopy.   Past Medical History:  Diagnosis Date   Allergy    Diabetes mellitus without complication ([place])    GERD (gastroesophageal reflux disease)     Past Surgical History:  Procedure Laterality Date   COLONOSCOPY     COLONOSCOPY WITH PROPOFOL N/A 11/01/2019   Procedure: COLONOSCOPY WITH PROPOFOL;  Surgeon: Virgel Manifold, MD;  Location: [facility] ENDOSCOPY;  Service: Endoscopy;  Laterality: N/A;   COLONOSCOPY WITH PROPOFOL N/A 11/13/2020   Procedure: COLONOSCOPY WITH PROPOFOL;  Surgeon: Virgel Manifold, MD;  Location: [facility] ENDOSCOPY;  Service: Gastroenterology;  Laterality: N/A;   ESOPHAGOGASTRODUODENOSCOPY (EGD) WITH PROPOFOL N/A 11/13/2014   Procedure: ESOPHAGOGASTRODUODENOSCOPY (EGD) WITH PROPOFOL;  Surgeon: Hulen Luster, MD;  Location: [facility] ENDOSCOPY;  Service: Gastroenterology;  Laterality: N/A;   TONSILLECTOMY     WRIST MASS EXCISION      Prior to Admission medications   Medication Sig Start Date End Date Taking? Authorizing Provider  cetirizine (ZYRTEC) 10 MG tablet Take 10 mg by mouth as needed for allergies.   Yes [provider]  Multiple Vitamin (MULTIVITAMIN) tablet Take 1 tablet by mouth daily.   Yes [provider]  omeprazole (PRILOSEC) 20 MG capsule TAKE 1 CAPSULE BY MOUTH DAILY BEFORE BREAKFAST. 10/18/21  Yes Kar[place], Devonne Doughty, DO  acetaminophen (TYLENOL) 500 MG tablet Take 500 mg by mouth every 6 (six) hours as needed.    [provider]  ibuprofen (ADVIL,MOTRIN) 200 MG tablet Take 200 mg by mouth every 6 (six) hours as needed.    [provider]  OZEMPIC, 1 MG/DOSE, 4 MG/3ML SOPN Inject 1 mg into the  skin once a week. 01/07/21   Olin Hauser, DO    Allergies as of 08/26/2021 - Review Complete 01/07/2021  Allergen Reaction Noted   Erythromycin Nausea And Vomiting 11/10/2014    Family History  Problem Relation Age of Onset   Cancer Mother        BREAST   Heart attack Father 3       57 repeat   Heart disease Father 98   Colon cancer Paternal Grandfather 68   Prostate cancer Neg Hx     Social History   Socioeconomic History   Marital status: Married    Spouse name: Not on file   Number of children: Not on file   Years of education: Not on file   Highest education level: Not on file  Occupational History   Not on file  Tobacco Use   Smoking status: Former    Types: Cigarettes    Quit date: 01/25/2008    Years since quitting: 13.8   Smokeless tobacco: Former   Tobacco comments:    Initially quit 2001, then resumed temporarily 2010  Vaping Use   Vaping Use: Never used  Substance and Sexual Activity   Alcohol use: Yes    Alcohol/week: 12.0 standard drinks of alcohol    Types: 12 Cans of beer per week    Comment: a few shots of liquor in evening, none in the last week   Drug use: No   Sexual activity: Not on file  Other Topics Concern   Not on file  Social History Narrative   Not on file   Social Determinants of Health   Financial Resource Strain: Not on file  Food Insecurity: Not on file  Transportation Needs: Not on file  Physical Activity: Not on file  Stress: Not on file  Social Connections: Not on file  Intimate Partner Violence: Not on file    Review of Systems: See HPI, otherwise negative ROS  Physical Exam: There were no vitals taken for this visit. General:   Alert,  pleasant and cooperative in NAD Head:  Normocephalic and atraumatic. Neck:  Supple; no masses or thyromegaly. Lungs:  Clear throughout to auscultation, normal respiratory effort.    Heart:  +S1, +S2, Regular rate and rhythm, No edema. Abdomen:  Soft, nontender and  nondistended. Normal bowel sounds, without guarding, and without rebound.   Neurologic:  Alert and  oriented x4;  grossly normal neurologically.  Impression/Plan: Isaiah Hensen. is here for an colonoscopy to be performed for surveillance due to prior history of colon polyps   Risks, benefits, limitations, and alternatives regarding  colonoscopy have been reviewed with the patient.  Questions have been answered.  All parties agreeable.   Isaiah Bellows, MD  11/19/2021, 7:52 AM

## 2021-11-19 NOTE — Op Note (Signed)
[facility] Gastroenterology Patient Name: Isaiah Doyle Procedure Date: 11/19/2021 7:52 AM MRN: [id number] Account #: [id number] Date of Birth: October 12, 1970 Admit Type: Outpatient Age: 51 Room: [facility] ENDO ROOM 4 Gender: Male Note Status: Finalized Instrument Name: Jasper Riling [id number] Procedure:             Colonoscopy Indications:           Surveillance: History of piecemeal removal adenoma on                         last colonoscopy (< 3 yrs) Providers:             Jonathon Bellows MD, MD Referring MD:          Olin Hauser (Referring MD) Medicines:             Monitored Anesthesia Care Complications:         No immediate complications. Procedure:             Pre-Anesthesia Assessment:                        - Prior to the procedure, a History and Physical was                         performed, and patient medications, allergies and                         sensitivities were reviewed. The patient's tolerance                         of previous anesthesia was reviewed.                        - The risks and benefits of the procedure and the                         sedation options and risks were discussed with the                         patient. All questions were answered and informed                         consent was obtained.                        - ASA Grade Assessment: II - A patient with mild                         systemic disease.                        After obtaining informed consent, the colonoscope was                         passed under direct vision. Throughout the procedure,                         the patient's blood pressure, pulse, and oxygen                         saturations  were monitored continuously. The                         Colonoscope was introduced through the anus and                         advanced to the the cecum, identified by the                         appendiceal orifice. The colonoscopy was performed                          with ease. The patient tolerated the procedure well.                         The quality of the bowel preparation was excellent. Findings:      The perianal and digital rectal examinations were normal.      A 5 mm polyp was found in the ascending colon. The polyp was sessile.       The polyp was removed with a cold snare. Resection and retrieval were       complete.      A tattoo was seen in the sigmoid colon. A post-polypectomy scar was       found at the tattoo site. There was no evidence of residual polyp tissue.      The exam was otherwise without abnormality on direct and retroflexion       views. Impression:            - One 5 mm polyp in the ascending colon, removed with                         a cold snare. Resected and retrieved.                        - A tattoo was seen in the sigmoid colon. A                         post-polypectomy scar was found at the tattoo site.                         There was no evidence of residual polyp tissue.                        - The examination was otherwise normal on direct and                         retroflexion views. Recommendation:        - Discharge patient to home (with escort).                        - Resume previous diet.                        - Continue present medications.                        - Await pathology results.                        -  Repeat colonoscopy in 3 years for surveillance. Procedure Code(s):     --- Professional ---                        [phone], Colonoscopy, flexible; with removal of                         tumor(s), polyp(s), or other lesion(s) by snare                         technique Diagnosis Code(s):     --- Professional ---                        Z86.010, Personal history of colonic polyps                        D12.2, Benign neoplasm of ascending colon CPT copyright 2022 American Medical Association. All rights reserved. The codes documented in this report are preliminary and upon coder  review may  be revised to meet current compliance requirements. Jonathon Bellows, MD Jonathon Bellows MD, MD 11/19/2021 8:24:49 AM This report has been signed electronically. Number of Addenda: 0 Note Initiated On: 11/19/2021 7:52 AM Scope Withdrawal Time: 0 hours 9 minutes 49 seconds  Total Procedure Duration: 0 hours 13 minutes 33 seconds  Estimated Blood Loss:  Estimated blood loss: none.      [facility]

## 2021-11-22 ENCOUNTER — Encounter: Payer: Self-pay | Admitting: Gastroenterology

## 2021-11-22 LAB — SURGICAL PATHOLOGY

## 2021-12-27 DIAGNOSIS — M9901 Segmental and somatic dysfunction of cervical region: Secondary | ICD-10-CM | POA: Diagnosis not present

## 2021-12-27 DIAGNOSIS — M5413 Radiculopathy, cervicothoracic region: Secondary | ICD-10-CM | POA: Diagnosis not present

## 2021-12-27 DIAGNOSIS — M9904 Segmental and somatic dysfunction of sacral region: Secondary | ICD-10-CM | POA: Diagnosis not present

## 2021-12-27 DIAGNOSIS — M9903 Segmental and somatic dysfunction of lumbar region: Secondary | ICD-10-CM | POA: Diagnosis not present

## 2022-01-23 ENCOUNTER — Other Ambulatory Visit: Payer: Self-pay | Admitting: Family Medicine

## 2022-01-23 DIAGNOSIS — E1169 Type 2 diabetes mellitus with other specified complication: Secondary | ICD-10-CM

## 2022-01-25 NOTE — Telephone Encounter (Signed)
Requested Prescriptions  Pending Prescriptions Disp Refills   OZEMPIC, 1 MG/DOSE, 4 MG/3ML SOPN [Pharmacy Med Name: OZEMPIC '1MG'$  PER DOSE ('4MG'$ /3ML) PFP] 9 mL 0    Sig: INJECT 1 MG INTO THE SKIN ONCE A WEEK     Endocrinology:  Diabetes - GLP-1 Receptor Agonists - semaglutide Failed - 01/23/2022  3:04 PM      Failed - HBA1C in normal range and within 180 days    Hgb A1c MFr Bld  Date Value Ref Range Status  10/01/2021 5.9 (H) <5.7 % of total Hgb Final    Comment:    For someone without known diabetes, a hemoglobin  A1c value between 5.7% and 6.4% is consistent with prediabetes and should be confirmed with a  follow-up test. . For someone with known diabetes, a value <7% indicates that their diabetes is well controlled. A1c targets should be individualized based on duration of diabetes, age, comorbid conditions, and other considerations. . This assay result is consistent with an increased risk of diabetes. . Currently, no consensus exists regarding use of hemoglobin A1c for diagnosis of diabetes for children. .          Passed - Cr in normal range and within 360 days    Creat  Date Value Ref Range Status  10/01/2021 1.11 0.70 - 1.30 mg/dL Final   Creatinine, Urine  Date Value Ref Range Status  10/08/2021 144 20 - 320 mg/dL Final         Passed - Valid encounter within last 6 months    Recent Outpatient Visits           3 months ago Annual physical exam   [place], DO   1 year ago Need for immunization against influenza   [place], Devonne Doughty, DO   1 year ago Type 2 diabetes mellitus with other specified complication, without long-term current use of insulin [facility]   [place], DO   1 year ago Annual physical exam   [facility] Olin Hauser, DO   1 year ago Type 2 diabetes mellitus with other specified complication, without  long-term current use of insulin ([place])   [facility] [place], Devonne Doughty, DO       Future Appointments             In 2 months [place], Devonne Doughty, DO [facility], [facility]

## 2022-02-21 DIAGNOSIS — M9903 Segmental and somatic dysfunction of lumbar region: Secondary | ICD-10-CM | POA: Diagnosis not present

## 2022-02-21 DIAGNOSIS — M9904 Segmental and somatic dysfunction of sacral region: Secondary | ICD-10-CM | POA: Diagnosis not present

## 2022-02-21 DIAGNOSIS — M9901 Segmental and somatic dysfunction of cervical region: Secondary | ICD-10-CM | POA: Diagnosis not present

## 2022-02-21 DIAGNOSIS — M5413 Radiculopathy, cervicothoracic region: Secondary | ICD-10-CM | POA: Diagnosis not present

## 2022-03-08 ENCOUNTER — Ambulatory Visit: Payer: BC Managed Care – PPO | Admitting: Family Medicine

## 2022-03-08 ENCOUNTER — Encounter: Payer: Self-pay | Admitting: Family Medicine

## 2022-03-08 VITALS — BP 116/78 | HR 82 | Temp 98.2°F | Ht 76.0 in | Wt 243.0 lb

## 2022-03-08 DIAGNOSIS — R0981 Nasal congestion: Secondary | ICD-10-CM | POA: Diagnosis not present

## 2022-03-08 DIAGNOSIS — J0110 Acute frontal sinusitis, unspecified: Secondary | ICD-10-CM | POA: Diagnosis not present

## 2022-03-08 LAB — POC INFLUENZA A&B (BINAX/QUICKVUE)
Influenza A, POC: NEGATIVE
Influenza B, POC: NEGATIVE

## 2022-03-08 LAB — POC COVID19 BINAXNOW: SARS Coronavirus 2 Ag: NEGATIVE

## 2022-03-08 MED ORDER — IPRATROPIUM BROMIDE 0.06 % NA SOLN: 2.0000 | Freq: Four times a day (QID) | NASAL | 0 refills | Status: DC

## 2022-03-08 NOTE — Patient Instructions (Addendum)
Thank you for coming to the office today.  Likely viral syndrome Sinus symptoms mostly Negative COVID and Flu today Work notes  Start Atrovent nasal spray decongestant 2 sprays in each nostril up to 4 times daily for 7 days. Quick relief for congestion  Sudafed original formulation OTC talk to pharmacist show license, it is effective for sinus pressure congestion headache short term. Caution can raise BP but only use for a few days to week.  May restart Flonase, but it takes a few days to week to start working, it shrinks sinus tissue and opens it up more. But it does not resolve the congestion immediately  Future if not improving by 24-48 hours, message or call me back and we can consider Antibiotic and or Prednisone steroid course.   Hopefully this will gradually improve.  Please schedule a Follow-up Appointment to: Return if symptoms worsen or fail to improve.  If you have any other questions or concerns, please feel free to call the office or send a message through [place]. You may also schedule an earlier appointment if necessary.  Additionally, you may be receiving a survey about your experience at our office within a few days to 1 week by e-mail or mail. We value your feedback.  Nobie Putnam, DO [place]

## 2022-03-08 NOTE — Progress Notes (Signed)
Subjective:    Patient ID: Isaiah Hensen., male    DOB: 07/08/1970, 52 y.o.   MRN: [id number]  Isaiah Mahan. is a 52 y.o. male presenting on 03/08/2022 for Nasal Congestion, Sore Throat, Headache, and Cough  Patient presents for a same day appointment.  HPI  Acute Viral Syndrome Reports symptoms started 2 days ago with sneezing and feeling fatigue, nasal congestion, sore throat, headache, occasional cough. Felt some chills but possible fever but not measured - Out of work Monday and today - Home COVID test yesterday was negative.  Health Maintenance: UTD Flu and COVID vaccines.     10/08/2021    8:17 AM 01/07/2021    8:32 AM 01/02/2020    9:01 AM  Depression screen PHQ 2/9  Decreased Interest 0 0 0  Down, Depressed, Hopeless 0 0 0  PHQ - 2 Score 0 0 0  Altered sleeping 0 0   Tired, decreased energy 0 1   Change in appetite 0 0   Feeling bad or failure about yourself  0 0   Trouble concentrating 0 1   Moving slowly or fidgety/restless 0 0   Suicidal thoughts 0 0   PHQ-9 Score 0 2   Difficult doing work/chores Not difficult at all Not difficult at all     Social History   Tobacco Use   Smoking status: Former    Types: Cigarettes    Quit date: 01/25/2008    Years since quitting: 14.1   Smokeless tobacco: Former   Tobacco comments:    Initially quit 2001, then resumed temporarily 2010  Vaping Use   Vaping Use: Never used  Substance Use Topics   Alcohol use: Yes    Alcohol/week: 12.0 standard drinks of alcohol    Types: 12 Cans of beer per week    Comment: a few shots of liquor in evening, none in the last week   Drug use: No    Review of Systems Per HPI unless specifically indicated above     Objective:    BP 116/78   Pulse 82   Temp 98.2 F (36.8 C) (Oral)   Ht 6' 4"$  (1.93 m)   Wt 243 lb (110.2 kg)   SpO2 98%   BMI 29.58 kg/m   Wt Readings from Last 3 Encounters:  03/08/22 243 lb (110.2 kg)  11/19/21 230 lb 7.5 oz (104.5 kg)  10/08/21  243 lb 12.8 oz (110.6 kg)    Physical Exam Vitals and nursing note reviewed.  Constitutional:      General: He is not in acute distress.    Appearance: He is well-developed. He is not diaphoretic.     Comments: Well-appearing, comfortable, cooperative  HENT:     Head: Normocephalic and atraumatic.     Right Ear: Tympanic membrane, ear canal and external ear normal. There is no impacted cerumen.     Left Ear: Tympanic membrane, ear canal and external ear normal. There is no impacted cerumen.     Ears:     Comments: Trace effusion, no erythema Eyes:     General:        Right eye: No discharge.        Left eye: No discharge.     Conjunctiva/sclera: Conjunctivae normal.  Neck:     Thyroid: No thyromegaly.  Cardiovascular:     Rate and Rhythm: Normal rate and regular rhythm.     Pulses: Normal pulses.     Heart sounds: Normal heart  sounds. No murmur heard. Pulmonary:     Effort: Pulmonary effort is normal. No respiratory distress.     Breath sounds: Normal breath sounds. No wheezing or rales.  Musculoskeletal:        General: Normal range of motion.     Cervical back: Normal range of motion and neck supple.  Lymphadenopathy:     Cervical: No cervical adenopathy.  Skin:    General: Skin is warm and dry.     Findings: No erythema or rash.  Neurological:     Mental Status: He is alert and oriented to person, place, and time. Mental status is at baseline.  Psychiatric:        Behavior: Behavior normal.     Comments: Well groomed, good eye contact, normal speech and thoughts      Results for orders placed or performed in visit on 03/08/22  POC Influenza A&B (Binax test)  Result Value Ref Range   Influenza A, POC Negative Negative   Influenza B, POC Negative Negative  POC COVID-19  Result Value Ref Range   SARS Coronavirus 2 Ag Negative Negative      Assessment & Plan:   Problem List Items Addressed This Visit   None Visit Diagnoses     Acute non-recurrent frontal  sinusitis    -  Primary   Relevant Medications   ipratropium (ATROVENT) 0.06 % nasal spray   Nasal congestion       Relevant Orders   POC Influenza A&B (Binax test) (Completed)   POC COVID-19 (Completed)       Likely viral syndrome Sinus symptoms mostly Negative COVID and Flu today Work notes  Reassurance likely viral.  Start Atrovent nasal spray decongestant 2 sprays in each nostril up to 4 times daily for 7 days. Quick relief for congestion  Sudafed original formulation OTC talk to pharmacist show license, it is effective for sinus pressure congestion headache short term. Caution can raise BP but only use for a few days to week.  May restart Flonase, but it takes a few days to week to start working, it shrinks sinus tissue and opens it up more. But it does not resolve the congestion immediately  Future if not improving by 24-48 hours, message or call me back and we can consider Antibiotic and or Prednisone steroid course.    Meds ordered this encounter  Medications   ipratropium (ATROVENT) 0.06 % nasal spray    Sig: Place 2 sprays into both nostrils 4 (four) times daily. For up to 5-7 days then stop.    Dispense:  15 mL    Refill:  0    Follow up plan: Return if symptoms worsen or fail to improve.  Nobie Putnam, DO [facility] 03/08/2022, 2:04 PM

## 2022-03-30 ENCOUNTER — Other Ambulatory Visit: Payer: Self-pay | Admitting: Family Medicine

## 2022-03-30 DIAGNOSIS — J0110 Acute frontal sinusitis, unspecified: Secondary | ICD-10-CM

## 2022-03-30 NOTE — Telephone Encounter (Signed)
Requested medication (s) are due for refill today: review  Requested medication (s) are on the active medication list: yes  Last refill:  03/08/22 #6m/0  Future visit scheduled: yes  Notes to clinic:  rx was for 5-7 days, Pharmacy comment: REQUEST FOR 90 DAYS PRESCRIPTION. DX Code Needed.      Requested Prescriptions  Pending Prescriptions Disp Refills   ipratropium (ATROVENT) 0.06 % nasal spray [Pharmacy Med Name: IPRATROPIUM 0.06% SPRAY]  1    Sig: Place 2 sprays into both nostrils 4 (four) times daily. For up to 5-7 days then stop.     Off-Protocol Failed - 03/30/2022  8:32 AM      Failed - Medication not assigned to a protocol, review manually.      Passed - Valid encounter within last 12 months    Recent Outpatient Visits           3 weeks ago Acute non-recurrent frontal sinusitis   CWiley [place] DO   5 months [facility]   1 year [facility] against [facility]   1 year ago Type 2 diabetes mellitus with other specified complication, without long-term current use of insulin ([facility]   CL[place] DO   1 year ago Annual physical exam   [place]       Future Appointments             In 1 week [facility]          Off-Protocol Failed - 03/30/2022  8:32 AM      Failed - Medication not assigned to a protocol, review manually.      Passed - Valid encounter within last 12 months    Recent Outpatient Visits           3 weeks ago Acute non-recurrent frontal sinusitis   [place]   5 months ago [facility]   1 year ago Need for immunization against influenza   [place]   1 year ago Type 2 diabetes mellitus with other specified complication, without long-term current use of insulin ([facility]   [facility]   1 year ago Annual physical exam   [facility]       Future Appointments             In 1 week KParks Ranger ADev[facility]ought[facility]

## 2022-03-31 ENCOUNTER — Other Ambulatory Visit: Payer: Self-pay

## 2022-03-31 DIAGNOSIS — R7989 Other specified abnormal findings of blood chemistry: Secondary | ICD-10-CM

## 2022-03-31 DIAGNOSIS — K760 Fatty (change of) liver, not elsewhere classified: Secondary | ICD-10-CM

## 2022-03-31 DIAGNOSIS — E1169 Type 2 diabetes mellitus with other specified complication: Secondary | ICD-10-CM

## 2022-04-01 ENCOUNTER — Other Ambulatory Visit: Payer: BC Managed Care – PPO

## 2022-04-01 DIAGNOSIS — R7989 Other specified abnormal findings of blood chemistry: Secondary | ICD-10-CM | POA: Diagnosis not present

## 2022-04-01 DIAGNOSIS — E1169 Type 2 diabetes mellitus with other specified complication: Secondary | ICD-10-CM | POA: Diagnosis not present

## 2022-04-01 DIAGNOSIS — K760 Fatty (change of) liver, not elsewhere classified: Secondary | ICD-10-CM | POA: Diagnosis not present

## 2022-04-02 LAB — HEPATIC FUNCTION PANEL
AG Ratio: 1.6 (calc) (ref 1.0–2.5)
ALT: 117 U/L — ABNORMAL HIGH (ref 9–46)
AST: 55 U/L — ABNORMAL HIGH (ref 10–35)
Albumin: 4.7 g/dL (ref 3.6–5.1)
Alkaline phosphatase (APISO): 69 U/L (ref 35–144)
Bilirubin, Direct: 0.2 mg/dL (ref 0.0–0.2)
Globulin: 2.9 g/dL (calc) (ref 1.9–3.7)
Indirect Bilirubin: 0.6 mg/dL (calc) (ref 0.2–1.2)
Total Bilirubin: 0.8 mg/dL (ref 0.2–1.2)
Total Protein: 7.6 g/dL (ref 6.1–8.1)

## 2022-04-02 LAB — HEMOGLOBIN A1C
Hgb A1c MFr Bld: 6.1 % of total Hgb — ABNORMAL HIGH (ref <5.7)
Mean Plasma Glucose: 128 mg/dL
eAG (mmol/L): 7.1 mmol/L

## 2022-04-08 ENCOUNTER — Ambulatory Visit: Payer: BC Managed Care – PPO | Admitting: Family Medicine

## 2022-04-08 ENCOUNTER — Encounter: Payer: Self-pay | Admitting: Family Medicine

## 2022-04-08 ENCOUNTER — Other Ambulatory Visit: Payer: Self-pay | Admitting: Family Medicine

## 2022-04-08 VITALS — BP 132/80 | HR 65 | Ht 76.0 in | Wt 246.0 lb

## 2022-04-08 DIAGNOSIS — E1169 Type 2 diabetes mellitus with other specified complication: Secondary | ICD-10-CM

## 2022-04-08 DIAGNOSIS — K219 Gastro-esophageal reflux disease without esophagitis: Secondary | ICD-10-CM

## 2022-04-08 DIAGNOSIS — Z0000 Encounter for general adult medical examination without abnormal findings: Secondary | ICD-10-CM

## 2022-04-08 DIAGNOSIS — N401 Enlarged prostate with lower urinary tract symptoms: Secondary | ICD-10-CM | POA: Diagnosis not present

## 2022-04-08 DIAGNOSIS — R350 Frequency of micturition: Secondary | ICD-10-CM

## 2022-04-08 DIAGNOSIS — E669 Obesity, unspecified: Secondary | ICD-10-CM

## 2022-04-08 DIAGNOSIS — H9313 Tinnitus, bilateral: Secondary | ICD-10-CM

## 2022-04-08 DIAGNOSIS — R7989 Other specified abnormal findings of blood chemistry: Secondary | ICD-10-CM

## 2022-04-08 DIAGNOSIS — R351 Nocturia: Secondary | ICD-10-CM

## 2022-04-08 DIAGNOSIS — I10 Essential (primary) hypertension: Secondary | ICD-10-CM

## 2022-04-08 MED ORDER — TAMSULOSIN HCL 0.4 MG PO CAPS: 0.4000 mg | ORAL_CAPSULE | Freq: Every day | ORAL | 3 refills | Status: DC

## 2022-04-08 MED ORDER — MOUNJARO 5 MG/0.5ML ~~LOC~~ SOAJ: 5.0000 mg | SUBCUTANEOUS | 1 refills | Status: DC

## 2022-04-08 MED ORDER — OMEPRAZOLE 20 MG PO CPDR: 20.0000 mg | DELAYED_RELEASE_CAPSULE | Freq: Every day | ORAL | 3 refills | Status: DC

## 2022-04-08 NOTE — Patient Instructions (Addendum)
Thank you for coming to the office today.  Recent Labs    10/01/21 0808 04/01/22 0757  HGBA1C 5.9* 6.1*   Overall well controlled  But we can change the treatment to reduce side effects  Finish your Ozempic 1mg  for 2 weeks  Ordered Mounjaro 5mg  weekly (90 day supply) to [facility], will need authorization first.  If you are running low on med and have not received Mounjaro yet, come by to pick up a sample Ozempic 0.5mg  (4 week)  DUE for FASTING BLOOD WORK (no food or drink after midnight before the lab appointment, only water or coffee without cream/sugar on the morning of)  SCHEDULE "Lab Only" visit in the morning at the clinic for lab draw in 6 MONTHS   - Make sure Lab Only appointment is at about 1 week before your next appointment, so that results will be available  For Lab Results, once available within 2-3 days of blood draw, you can can log in to MyChart online to view your results and a brief explanation. Also, we can discuss results at next follow-up visit.   Please schedule a Follow-up Appointment to: Return in about 6 months (around 10/09/2022) for 6 month fasting lab only then 1 week later Annual Physical.  If you have any other questions or concerns, please feel free to call the office or send a message through [place]. You may also schedule an earlier appointment if necessary.  Additionally, you may be receiving a survey about your experience at our office within a few days to 1 week by e-mail or mail. We value your feedback.  Nobie Putnam, DO [place]

## 2022-04-08 NOTE — Assessment & Plan Note (Signed)
Improved but still elevated AST ALT Seems related to fatty liver w/ cholesterol / weight Discussed lifestyle management He prefers this option to referral to GI at this time.

## 2022-04-08 NOTE — Assessment & Plan Note (Signed)
DM A1c controlled at 6.1 slight increase On lifestyle and GLP1 No Hypoglycemia and RESOLVED Hyperglycemia. Complications - hyperlipidemia, GERD - increases risk of future cardiovascular complications   Plan:  Discussed change from ozempic 1 due to side effects with digestion Finish your Ozempic 1mg  for 2 weeks Ordered Mounjaro 5mg  weekly (90 day supply) to [facility], will need authorization first. If you are running low on med and have not received Mounjaro yet, come by to pick up a sample Ozempic 0.5mg  (4 week)  Encourage improved lifestyle - low carb, low sugar diet, reduce portion size, continue improving restart regular exercise Off ARB due to dizziness Consider statin therapy will offer

## 2022-04-08 NOTE — Assessment & Plan Note (Signed)
BPH LUTS frequency History of nephrolithiasis Frequency - refill Tamsulosin 0.4mg  daily

## 2022-04-08 NOTE — Progress Notes (Signed)
Subjective:    Patient ID: Isaiah Hensen., male    DOB: [birth year], 52 y.o.   MRN: [id number]  Isaiah Edlund. is a 52 y.o. male presenting on 04/08/2022 for Diabetes and Hypertension   HPI  CHRONIC DM, Type 2: Reports no concerns, now improving with lifestyle changes. CBGs: Avg 100-120, Low >100, High < 150. Checks CBGs x 1 daily fasting AM Last result A1c 6.1 now, slight change. Meds: Ozempic 1mg  weekly Off Metformin Reports  good compliance. Tolerating well but has some frequent BMs and inc amount maybe even diarrhea. Not constipation. Currently not on ACEi/ARB Lifestyle: Weight gain 10-15 lbs - Diet (Similar to last time - high protein options, salads) - Exercise (limited exercise due to time) Diabetic Eye exam done 12/2020 Denies hypoglycemia, polyuria, visual changes, numbness or tingling.   HYPERLIPIDEMIA: - Reports no concerns. Last lipid panel 09/2021 improved LDL 146 to 130. Still elevated TG with sweets in diet Not on statin therapy  Elevated LFTs Fatty Liver on last [place] Lab result still elevated but improved from 1-2 years.   Sister dx Hemochromotosis Last lab shows high normal Ferritin. Normal CBC   Essential Hypertension He admitted a dizzy spell almost vertigo recently. Not checking home BP, has had some elevation before Fam history of HTN Major stressors at work Current Meds - OFF Olmesartan 5mg  daily due to dizziness.   Denies CP, dyspnea, HA, edema, dizziness / lightheadedness     BPH Frequency LUTS History of Kidney Stones Doing well without recent kidney stones. No actual current symptoms. He usually takes Flomax PRN but now taking Flomax regularly now needs rx re order due to BPH and urinary frequency  Hearing Loss / Tinnitus He had hearing test by Dr Tami Ribas ENT History of loud music If in noisy situation he has hard time hearing   Health Maintenance:   COVID19 not updated yet for booster   Deferred Flu Shot.   Colon CA  Screening, - Last colonoscopy 11/19/21 1 polyp pre cancer removed, repeat 3 years.   PSA 0.25 (09/2021)       04/08/2022   10:56 AM 10/08/2021    8:17 AM 01/07/2021    8:32 AM  Depression screen PHQ 2/9  Decreased Interest 0 0 0  Down, Depressed, Hopeless 0 0 0  PHQ - 2 Score 0 0 0  Altered sleeping 0 0 0  Tired, decreased energy 0 0 1  Change in appetite 0 0 0  Feeling bad or failure about yourself  0 0 0  Trouble concentrating 0 0 1  Moving slowly or fidgety/restless 0 0 0  Suicidal thoughts 0 0 0  PHQ-9 Score 0 0 2  Difficult doing work/chores Not difficult at all Not difficult at all Not difficult at all      04/08/2022   10:57 AM 10/08/2021    8:18 AM 01/07/2021    8:33 AM 01/02/2020    8:39 AM  GAD 7 : Generalized Anxiety Score  Nervous, Anxious, on Edge 0 0 1 0  Control/stop worrying  0 1 0  Worry too much - different things 0 0 1 0  Trouble relaxing 0 0 1 0  Restless 0 0 0 0  Easily annoyed or irritable 0 0 0 0  Afraid - awful might happen 0 0 0 0  Total GAD 7 Score  0 4 0  Anxiety Difficulty Not difficult at all Not difficult at all Not difficult at all Not difficult at  all      Social History   Tobacco Use   Smoking status: Former    Types: Cigarettes    Quit date: 01/25/2008    Years since quitting: 14.2   Smokeless tobacco: Former   Tobacco comments:    Initially quit 2001, then resumed temporarily 2010  Vaping Use   Vaping Use: Never used  Substance Use Topics   Alcohol use: Yes    Alcohol/week: 12.0 standard drinks of alcohol    Types: 12 Cans of beer per week    Comment: a few shots of liquor in evening, none in the last week   Drug use: No    Review of Systems Per HPI unless specifically indicated above     Objective:    BP 132/80   Pulse 65   Ht 6\' 4"  (1.93 m)   Wt 246 lb (111.6 kg)   SpO2 95%   BMI 29.94 kg/m   Wt Readings from Last 3 Encounters:  04/08/22 246 lb (111.6 kg)  03/08/22 243 lb (110.2 kg)  11/19/21 230 lb 7.5 oz  (104.5 kg)    Physical Exam Vitals and nursing note reviewed.  Constitutional:      General: He is not in acute distress.    Appearance: Normal appearance. He is well-developed. He is not diaphoretic.     Comments: Well-appearing, comfortable, cooperative  HENT:     Head: Normocephalic and atraumatic.  Eyes:     General:        Right eye: No discharge.        Left eye: No discharge.     Conjunctiva/sclera: Conjunctivae normal.  Cardiovascular:     Rate and Rhythm: Normal rate.  Pulmonary:     Effort: Pulmonary effort is normal.  Skin:    General: Skin is warm and dry.     Findings: No erythema or rash.  Neurological:     Mental Status: He is alert and oriented to person, place, and time.  Psychiatric:        Mood and Affect: Mood normal.        Behavior: Behavior normal.        Thought Content: Thought content normal.     Comments: Well groomed, good eye contact, normal speech and thoughts      Results for orders placed or performed in visit on 03/31/22  Hemoglobin A1c  Result Value Ref Range   Hgb A1c MFr Bld 6.1 (H) <5.7 % of total Hgb   Mean Plasma Glucose 128 mg/dL   eAG (mmol/L) 7.1 mmol/L  Hepatic function panel  Result Value Ref Range   Total Protein 7.6 6.1 - 8.1 g/dL   Albumin 4.7 3.6 - 5.1 g/dL   Globulin 2.9 1.9 - 3.7 g/dL (calc)   AG Ratio 1.6 1.0 - 2.5 (calc)   Total Bilirubin 0.8 0.2 - 1.2 mg/dL   Bilirubin, Direct 0.2 0.0 - 0.2 mg/dL   Indirect Bilirubin 0.6 0.2 - 1.2 mg/dL (calc)   Alkaline phosphatase (APISO) 69 35 - 144 U/L   AST 55 (H) 10 - 35 U/L   ALT 117 (H) 9 - 46 U/L      Assessment & Plan:   Problem List Items Addressed This Visit     Benign prostatic hyperplasia with urinary frequency    BPH LUTS frequency History of nephrolithiasis Frequency - refill Tamsulosin 0.4mg  daily      Relevant Medications   tamsulosin (FLOMAX) 0.4 MG CAPS capsule   Elevated LFTs  Improved but still elevated AST ALT Seems related to fatty liver  w/ cholesterol / weight Discussed lifestyle management He prefers this option to referral to GI at this time.      GERD (gastroesophageal reflux disease)   Relevant Medications   omeprazole (PRILOSEC) 20 MG capsule   Obesity (BMI 30.0-34.9)   Tinnitus   Type 2 diabetes mellitus with other specified complication (HCC) - Primary    DM A1c controlled at 6.1 slight increase On lifestyle and GLP1 No Hypoglycemia and RESOLVED Hyperglycemia. Complications - hyperlipidemia, GERD - increases risk of future cardiovascular complications   Plan:  Discussed change from ozempic 1 due to side effects with digestion Finish your Ozempic 1mg  for 2 weeks Ordered Mounjaro 5mg  weekly (90 day supply[facility], will need authorization first. If you are running low on med and have not received Mounjaro yet, come by to pick up a sample Ozempic 0.5mg  (4 week)  Encourage improved lifestyle - low carb, low sugar diet, reduce portion size, continue improving restart regular exercise Off ARB due to dizziness Consider statin therapy will offer      Relevant Medications   MOUNJARO 5 MG/0.5ML Pen    Tinnitus Observation, already seen ENT in past Discussed limited options, can return to ENT / Audiology in future if indicated  Meds ordered this encounter  Medications   MOUNJARO 5 MG/0.5ML Pen    Sig: Inject 5 mg into the skin once a week.    Dispense:  6 mL    Refill:  1    Change from Ozempic to Mounjaro   omeprazole (PRILOSEC) 20 MG capsule    Sig: Take 1 capsule (20 mg total) by mouth daily before breakfast.    Dispense:  90 capsule    Refill:  3   tamsulosin (FLOMAX) 0.4 MG CAPS capsule    Sig: Take 1 capsule (0.4 mg total) by mouth daily after breakfast.    Dispense:  90 capsule    Refill:  3      Follow up plan: Return in about 6 months (around 10/09/2022) for 6 month fasting lab only then 1 week later Annual Physical.  Future labs 10/06/22  Nobie Putnam, [facility] 04/08/2022, 8:15 AM

## 2022-05-03 DIAGNOSIS — M9901 Segmental and somatic dysfunction of cervical region: Secondary | ICD-10-CM | POA: Diagnosis not present

## 2022-05-03 DIAGNOSIS — M9904 Segmental and somatic dysfunction of sacral region: Secondary | ICD-10-CM | POA: Diagnosis not present

## 2022-05-03 DIAGNOSIS — M5413 Radiculopathy, cervicothoracic region: Secondary | ICD-10-CM | POA: Diagnosis not present

## 2022-05-03 DIAGNOSIS — M9903 Segmental and somatic dysfunction of lumbar region: Secondary | ICD-10-CM | POA: Diagnosis not present

## 2022-05-16 ENCOUNTER — Telehealth: Payer: Self-pay | Admitting: Family Medicine

## 2022-05-16 DIAGNOSIS — E1169 Type 2 diabetes mellitus with other specified complication: Secondary | ICD-10-CM

## 2022-05-16 NOTE — Telephone Encounter (Signed)
Medication Refill - Medication: Semaglutide,0.25 or 0.5MG /DOS, (OZEMPIC, 0.25 OR 0.5 MG/DOSE,) 2 MG/1.5ML SOPN   Has the patient contacted their pharmacy? No. Pt stated that he had another Rx but insurance denied it so he would like to request refill of previous Rx  Preferred Pharmacy (with phone number or street name):  [id number] [place], [place] - [address] AT [facility] [address] Phone: [phone]  Fax: [phone]     Has the patient been seen for an appointment in the last year OR does the patient have an upcoming appointment? Yes.    Agent: Please be advised that RX refills may take up to 3 business days. We ask that you follow-up with your pharmacy.

## 2022-05-17 MED ORDER — OZEMPIC (1 MG/DOSE) 4 MG/3ML ~~LOC~~ SOPN: 1.0000 mg | PEN_INJECTOR | SUBCUTANEOUS | 1 refills | Status: DC

## 2022-05-17 NOTE — Telephone Encounter (Signed)
Insurance denied mounjaro and pill that was prescribed.  Patient would like to go back on ozempic.

## 2022-05-17 NOTE — Telephone Encounter (Signed)
Switched from [place] to [employer]  weekly inj due to coverage  New rx sent.  Saralyn Pilar, DO [facility] 05/17/2022, 9:25 AM

## 2022-08-22 ENCOUNTER — Encounter: Payer: Self-pay | Admitting: Family Medicine

## 2022-08-22 ENCOUNTER — Ambulatory Visit: Payer: BC Managed Care – PPO | Admitting: Family Medicine

## 2022-08-22 ENCOUNTER — Ambulatory Visit: Payer: Self-pay | Admitting: *Deleted

## 2022-08-22 VITALS — BP 136/88 | HR 78 | Resp 18 | Ht 76.0 in | Wt 244.0 lb

## 2022-08-22 DIAGNOSIS — K8681 Exocrine pancreatic insufficiency: Secondary | ICD-10-CM | POA: Diagnosis not present

## 2022-08-22 DIAGNOSIS — E169 Disorder of pancreatic internal secretion, unspecified: Secondary | ICD-10-CM | POA: Diagnosis not present

## 2022-08-22 DIAGNOSIS — K529 Noninfective gastroenteritis and colitis, unspecified: Secondary | ICD-10-CM | POA: Diagnosis not present

## 2022-08-22 DIAGNOSIS — E1169 Type 2 diabetes mellitus with other specified complication: Secondary | ICD-10-CM | POA: Diagnosis not present

## 2022-08-22 MED ORDER — DICYCLOMINE HCL 10 MG PO CAPS: 10.0000 mg | ORAL_CAPSULE | Freq: Three times a day (TID) | ORAL | 0 refills | Status: DC

## 2022-08-22 MED ORDER — OZEMPIC (1 MG/DOSE) 4 MG/3ML ~~LOC~~ SOPN: 1.0000 mg | PEN_INJECTOR | SUBCUTANEOUS | 1 refills | Status: DC

## 2022-08-22 NOTE — Progress Notes (Signed)
Subjective:    Patient ID: Isaiah Doyle., male    DOB: February 11, 1970, 52 y.o.   MRN: [id number]  Isaiah Doyle. is a 52 y.o. male presenting on 08/22/2022 for Diabetes (Stopped Ozempic mid June due to side effects.  Not felling well for 2 weeks. Elevated blood sugars.  Patient took Ozempic on 08/21/22 due to blood sugar being 542. )   HPI  CHRONIC DM, Type 2:  Last visit 3/15 we discussed Ozempic side effects with GI concerns,   He quit taking Ozempic 6 weeks ago, earlier in June. He said feeling bad, high blood sugar, increased thirst, polydipsia, had hyperglycemia 514, he took Ozempic dose and then has had some reduced blood sugar. 400s > 300s > 200s Feeling hot flashes at time, worse at night  CBGs Last result A1c 6.1 (03/2022) Meds: Ozempic 1mg  weekly Off Metformin Reports  good compliance. Tolerating well but has some frequent BMs and inc amount maybe even diarrhea. Not constipation. Currently not on ACEi/ARB Lifestyle: - Diet (goal to improve - Exercise (limited exercise due to time) Denies hypoglycemia, polyuria, visual changes, numbness or tingling.      08/22/2022    2:12 PM 04/08/2022   10:56 AM 10/08/2021    8:17 AM  Depression screen PHQ 2/9  Decreased Interest 0 0 0  Down, Depressed, Hopeless 0 0 0  PHQ - 2 Score 0 0 0  Altered sleeping 0 0 0  Tired, decreased energy 3 0 0  Change in appetite 3 0 0  Feeling bad or failure about yourself  0 0 0  Trouble concentrating 2 0 0  Moving slowly or fidgety/restless 0 0 0  Suicidal thoughts 0 0 0  PHQ-9 Score 8 0 0  Difficult doing work/chores Not difficult at all Not difficult at all Not difficult at all    Social History   Tobacco Use   Smoking status: Former    Current packs/day: 0.00    Types: Cigarettes    Quit date: 01/25/2008    Years since quitting: 14.5   Smokeless tobacco: Former   Tobacco comments:    Initially quit 2001, then resumed temporarily 2010  Vaping Use   Vaping status: Never Used   Substance Use Topics   Alcohol use: Yes    Alcohol/week: 12.0 standard drinks of alcohol    Types: 12 Cans of beer per week    Comment: a few shots of liquor in evening, none in the last week   Drug use: No    Review of Systems Per HPI unless specifically indicated above     Objective:    BP 136/88 (BP Location: Left Arm, Patient Position: Sitting, Cuff Size: Normal)   Pulse 78   Resp 18   Ht 6\' 4"  (1.93 m)   Wt 244 lb (110.7 kg)   SpO2 96%   BMI 29.70 kg/m   Wt Readings from Last 3 Encounters:  08/22/22 244 lb (110.7 kg)  04/08/22 246 lb (111.6 kg)  03/08/22 243 lb (110.2 kg)    Physical Exam Vitals and nursing note reviewed.  Constitutional:      General: He is not in acute distress.    Appearance: Normal appearance. He is well-developed. He is not diaphoretic.     Comments: Well-appearing, comfortable, cooperative  HENT:     Head: Normocephalic and atraumatic.  Eyes:     General:        Right eye: No discharge.  Left eye: No discharge.     Conjunctiva/sclera: Conjunctivae normal.  Cardiovascular:     Rate and Rhythm: Normal rate.  Pulmonary:     Effort: Pulmonary effort is normal.  Skin:    General: Skin is warm and dry.     Findings: No erythema or rash.  Neurological:     Mental Status: He is alert and oriented to person, place, and time.  Psychiatric:        Mood and Affect: Mood normal.        Behavior: Behavior normal.        Thought Content: Thought content normal.     Comments: Well groomed, good eye contact, normal speech and thoughts    Results for orders placed or performed in visit on 03/31/22  Hemoglobin A1c  Result Value Ref Range   Hgb A1c MFr Bld 6.1 (H) <5.7 % of total Hgb   Mean Plasma Glucose 128 mg/dL   eAG (mmol/L) 7.1 mmol/L  Hepatic function panel  Result Value Ref Range   Total Protein 7.6 6.1 - 8.1 g/dL   Albumin 4.7 3.6 - 5.1 g/dL   Globulin 2.9 1.9 - 3.7 g/dL (calc)   AG Ratio 1.6 1.0 - 2.5 (calc)   Total  Bilirubin 0.8 0.2 - 1.2 mg/dL   Bilirubin, Direct 0.2 0.0 - 0.2 mg/dL   Indirect Bilirubin 0.6 0.2 - 1.2 mg/dL (calc)   Alkaline phosphatase (APISO) 69 35 - 144 U/L   AST 55 (H) 10 - 35 U/L   ALT 117 (H) 9 - 46 U/L      Assessment & Plan:   Problem List Items Addressed This Visit     Type 2 diabetes mellitus with other specified complication (HCC) - Primary   Relevant Medications   OZEMPIC, 1 MG/DOSE, 4 MG/3ML SOPN   Other Visit Diagnoses     Disorder of endocrine pancreas       Postprandial diarrhea       Relevant Medications   dicyclomine (BENTYL) 10 MG capsule   Exocrine pancreatic insufficiency           Type 2 Diabetes with hyperglycemia complication Off GLP therapy due to side effect 6 weeks Now hyperglycemia, CBG >500+ recently  Postprandial Diarrhea / Digestive symptoms  Discussion on med management, and agree to maintain course back on Ozempic 1mg  weekly, unable to get Mounjaro covered due to insurance.  Goal to recalibrate back to managed sugars, may have fatigue and symptoms with this shift  Keep on the Ozempic 1mg  weekly inj. You can look into the Denial on Mounjaro in future and see if they had information as to why denied. Let me know.  Rx Dicyclomine for abdominal cramping bloating diarrhea.  Try the sample Zenpep medication for possible exocrine pancreatic insufficiency and see if this solves the diarrhea problem.  Meds ordered this encounter  Medications   dicyclomine (BENTYL) 10 MG capsule    Sig: Take 1 capsule (10 mg total) by mouth 4 (four) times daily -  before meals and at bedtime. As needed for abdominal cramping bloating diarrhea urgency    Dispense:  30 capsule    Refill:  0   OZEMPIC, 1 MG/DOSE, 4 MG/3ML SOPN    Sig: Inject 1 mg into the skin once a week.    Dispense:  9 mL    Refill:  1      Follow up plan: Return if symptoms worsen or fail to improve.   Saralyn Pilar, DO [facility] 08/22/2022, 2:27 PM

## 2022-08-22 NOTE — Telephone Encounter (Signed)
Summary: Elevated Blood Sugar with sx   Pt is calling to report that over the weekend his Blood Sugar was 545 with sx of thirstiness, and fatigue, BS is back down to 225. But reports still not feeling good.       Chief Complaint: elevated blood glucose 225 this am over W/E 545 Symptoms: fatigue, feeling very tired today and called out of work. Fasting BS this am 225. Over W/E BS 545 sx of excessive thirst, fatigue, urine with "suds in it ". Took Ozempic shot yesterday. Frequency: over weekend Pertinent Negatives: Patient denies headache blurred vision.  Disposition: [] ED /[] Urgent Care (no appt availability in office) / [x] Appointment(In office/virtual)/ []  [facility]/ [] Home Care/ [] Refused Recommended Disposition /[] [facility]/ []  Follow-up with PCP Additional Notes:   Scheduled appt today with PCP. Need for medication adjustments per patient request. Patient reports he stopped Ozempic back in June approx. 6 weeks due to side effects of feeling bloated, diarrhea and nausea after taking. Patient insurance "said no" to changing medication to another medication. Patient still had Ozempic shot and took yesterday to lower blood glucose.      Reason for Disposition  [1] Caller has NON-URGENT medication or insulin pump question AND [2] triager unable to answer question  Answer Assessment - Initial Assessment Questions 1. BLOOD GLUCOSE: "What is your blood glucose level?"      225 2. ONSET: "When did you check the blood glucose?"     This am  3. USUAL RANGE: "What is your glucose level usually?" (e.g., usual fasting morning value, usual evening value)     Unknown  4. KETONES: "Do you check for ketones (urine or blood test strips)?" If Yes, ask: "What does the test show now?"      na 5. TYPE 1 or 2:  "Do you know what type of diabetes you have?"  (e.g., Type 1, Type 2, Gestational; doesn't know)      Na  6. INSULIN: "Do you take insulin?" "What type of insulin(s) do  you use? What is the mode of delivery? (syringe, pen; injection or pump)?"      Ozempic  7. DIABETES PILLS: "Do you take any pills for your diabetes?" If Yes, ask: "Have you missed taking any pills recently?"     None  8. OTHER SYMPTOMS: "Do you have any symptoms?" (e.g., fever, frequent urination, difficulty breathing, dizziness, weakness, vomiting)     Over W/E blood sugar 545. Sx ex. Thirst, fatigue. Now BS 225 . Feels tired, called out of work  9. PREGNANCY: "Is there any chance you are pregnant?" "When was your last menstrual period?"     na  Protocols used: Diabetes - High Blood Sugar-A-AH

## 2022-08-22 NOTE — Patient Instructions (Addendum)
Thank you for coming to the office today.  It will take time to get energy back, from high sugars 400-500+  We will need to correct this and lower sugar quickly will cause symptoms  Keep on the Ozempic 1mg  weekly inj.  You can look into the Denial on Mounjaro in future and see if they had information as to why denied. Let me know.  Consider rx Dicyclomine for abdominal cramping bloating diarrhea.  Try the Zepep medication for pancreatic insufficiency and see if this solves the diarrhea problem.  Exocrine Pancreatic Insufficiency  The pancreas is a gland in the abdomen between the stomach and the spine. The pancreas makes hormones and enzymes that help the body control blood sugar, digest food, and store energy from food. Exocrine pancreatic insufficiency (EPI), is when the pancreas does not make enough proteins (enzymes) to digest food. If EPI is not treated, your body may not get enough nutrition or vitamins from the food you eat. EPI may lead to weak bones (low bone mass), a weak body defense system (immune system), and an increase in your risk of heart problems. What are the causes? This condition may be caused by: Inflammation of the pancreas (pancreatitis). Cystic fibrosis. This is an inherited disease that mainly affects the lungs and digestive system. Cancer of the pancreas. Surgery on the pancreas. Diabetes. What increases the risk? You are more likely to develop this condition if: Your pancreas does not make a lot of enzymes. Not enough enzymes reach your small intestine. Something else stops the enzymes from mixing with food. Other factors that increase the risk include: Smoking. Drinking a lot of alcohol. What are the signs or symptoms? Symptoms of this condition include: Bloating or passing a lot of gas. Pain or cramps in the abdomen. Diarrhea. This may be oily and smell bad. Weight loss. How is this diagnosed? This condition may be diagnosed based on: Your  medical and family history. A physical exam. Blood tests. Poop (stool) tests. How is this treated? This condition may be treated with: Pancreatic enzyme replacement therapy, or PERT. This is taking medicine that has enzymes to help you digest food. Management of the medical condition causing your EPI. Lifestyle changes. These may include: Diet changes. A dietitian may develop a specific eating plan for your condition. Quitting smoking. Avoiding alcohol. Follow these instructions at home: Eating and drinking Eat frequent healthy meals and snacks as told by your dietitian. Do not drink alcohol. General instructions Take over-the-counter and prescription medicines only as told by your health care provider. This includes vitamin or mineral supplements. Do not use any products that contain nicotine or tobacco. These products include cigarettes, chewing tobacco, and vaping devices, such as e-cigarettes. If you need help quitting, ask your provider. Keep all follow-up visits. Your provider will check if the treatment is working. Where to find more information American G[employer] Association (AGA) GI Patient Center: patient.gastro.org Contact a health care provider if: Your symptoms get worse, or you get new symptoms. You continue to lose weight. This information is not intended to replace advice given to you by your health care provider. Make sure you discuss any questions you have with your health care provider. Document Revised: 09/14/2021 Document Reviewed: 08/27/2021 Elsevier Patient Education  2024 Elsevier Inc.   Please schedule a Follow-up Appointment to: Return if symptoms worsen or fail to improve.  If you have any other questions or concerns, please feel free to call the office or send a message through MyChart. You  may also schedule an earlier appointment if necessary.  Additionally, you may be receiving a survey about your experience at our office within a few days to  1 week by e-mail or mail. We value your feedback.  Saralyn Pilar, DO [facility], [place]

## 2022-08-23 ENCOUNTER — Encounter: Payer: Self-pay | Admitting: Family Medicine

## 2022-08-23 DIAGNOSIS — E1169 Type 2 diabetes mellitus with other specified complication: Secondary | ICD-10-CM

## 2022-08-25 MED ORDER — MOUNJARO 5 MG/0.5ML ~~LOC~~ SOAJ: 5.0000 mg | SUBCUTANEOUS | 0 refills | Status: DC

## 2022-08-25 NOTE — Addendum Note (Signed)
Addended by: Smitty Cords on: 08/25/2022 01:44 PM   Modules accepted: Orders

## 2022-09-07 DIAGNOSIS — M9901 Segmental and somatic dysfunction of cervical region: Secondary | ICD-10-CM | POA: Diagnosis not present

## 2022-09-07 DIAGNOSIS — M9903 Segmental and somatic dysfunction of lumbar region: Secondary | ICD-10-CM | POA: Diagnosis not present

## 2022-09-07 DIAGNOSIS — M5413 Radiculopathy, cervicothoracic region: Secondary | ICD-10-CM | POA: Diagnosis not present

## 2022-09-07 DIAGNOSIS — M9904 Segmental and somatic dysfunction of sacral region: Secondary | ICD-10-CM | POA: Diagnosis not present

## 2022-10-03 DIAGNOSIS — S93601A Unspecified sprain of right foot, initial encounter: Secondary | ICD-10-CM | POA: Diagnosis not present

## 2022-10-03 DIAGNOSIS — M25474 Effusion, right foot: Secondary | ICD-10-CM | POA: Diagnosis not present

## 2022-10-06 ENCOUNTER — Other Ambulatory Visit: Payer: BC Managed Care – PPO

## 2022-10-10 ENCOUNTER — Other Ambulatory Visit: Payer: Self-pay | Admitting: Family Medicine

## 2022-10-10 DIAGNOSIS — E1169 Type 2 diabetes mellitus with other specified complication: Secondary | ICD-10-CM

## 2022-10-10 MED ORDER — MOUNJARO 5 MG/0.5ML ~~LOC~~ SOAJ: 5.0000 mg | SUBCUTANEOUS | 0 refills | Status: DC

## 2022-10-12 ENCOUNTER — Other Ambulatory Visit: Payer: BC Managed Care – PPO

## 2022-10-12 DIAGNOSIS — E1169 Type 2 diabetes mellitus with other specified complication: Secondary | ICD-10-CM | POA: Diagnosis not present

## 2022-10-12 DIAGNOSIS — N401 Enlarged prostate with lower urinary tract symptoms: Secondary | ICD-10-CM

## 2022-10-12 DIAGNOSIS — Z0000 Encounter for general adult medical examination without abnormal findings: Secondary | ICD-10-CM

## 2022-10-12 DIAGNOSIS — I10 Essential (primary) hypertension: Secondary | ICD-10-CM | POA: Diagnosis not present

## 2022-10-12 DIAGNOSIS — R351 Nocturia: Secondary | ICD-10-CM

## 2022-10-12 DIAGNOSIS — E785 Hyperlipidemia, unspecified: Secondary | ICD-10-CM | POA: Diagnosis not present

## 2022-10-13 ENCOUNTER — Encounter: Payer: BC Managed Care – PPO | Admitting: Family Medicine

## 2022-10-13 LAB — COMPLETE METABOLIC PANEL WITH GFR
AG Ratio: 1.7 (calc) (ref 1.0–2.5)
ALT: 117 U/L — ABNORMAL HIGH (ref 9–46)
AST: 68 U/L — ABNORMAL HIGH (ref 10–35)
Albumin: 4.7 g/dL (ref 3.6–5.1)
Alkaline phosphatase (APISO): 78 U/L (ref 35–144)
BUN: 11 mg/dL (ref 7–25)
CO2: 27 mmol/L (ref 20–32)
Calcium: 9.3 mg/dL (ref 8.6–10.3)
Chloride: 105 mmol/L (ref 98–110)
Creat: 0.95 mg/dL (ref 0.70–1.30)
Globulin: 2.8 g/dL (calc) (ref 1.9–3.7)
Glucose, Bld: 113 mg/dL — ABNORMAL HIGH (ref 65–99)
Potassium: 4.1 mmol/L (ref 3.5–5.3)
Sodium: 139 mmol/L (ref 135–146)
Total Bilirubin: 0.9 mg/dL (ref 0.2–1.2)
Total Protein: 7.5 g/dL (ref 6.1–8.1)
eGFR: 96 mL/min/{1.73_m2} (ref >=60)

## 2022-10-13 LAB — LIPID PANEL
Cholesterol: 208 mg/dL — ABNORMAL HIGH (ref <200)
HDL: 48 mg/dL (ref >=40)
LDL Cholesterol (Calc): 130 mg/dL (calc) — ABNORMAL HIGH
Non-HDL Cholesterol (Calc): 160 mg/dL (calc) — ABNORMAL HIGH (ref <130)
Total CHOL/HDL Ratio: 4.3 (calc) (ref <5.0)
Triglycerides: 167 mg/dL — ABNORMAL HIGH (ref <150)

## 2022-10-13 LAB — CBC WITH DIFFERENTIAL/PLATELET
Absolute Monocytes: 605 cells/uL (ref 200–950)
Basophils Absolute: 48 cells/uL (ref 0–200)
Basophils Relative: 0.7 %
Eosinophils Absolute: 102 cells/uL (ref 15–500)
Eosinophils Relative: 1.5 %
HCT: 45.4 % (ref 38.5–50.0)
Hemoglobin: 15.1 g/dL (ref 13.2–17.1)
Lymphs Abs: 2020 cells/uL (ref 850–3900)
MCH: 30.7 pg (ref 27.0–33.0)
MCHC: 33.3 g/dL (ref 32.0–36.0)
MCV: 92.3 fL (ref 80.0–100.0)
MPV: 10.5 fL (ref 7.5–12.5)
Monocytes Relative: 8.9 %
Neutro Abs: 4026 cells/uL (ref 1500–7800)
Neutrophils Relative %: 59.2 %
Platelets: 189 10*3/uL (ref 140–400)
RBC: 4.92 10*6/uL (ref 4.20–5.80)
RDW: 13 % (ref 11.0–15.0)
Total Lymphocyte: 29.7 %
WBC: 6.8 10*3/uL (ref 3.8–10.8)

## 2022-10-13 LAB — HEMOGLOBIN A1C
Hgb A1c MFr Bld: 6.8 % of total Hgb — ABNORMAL HIGH (ref <5.7)
Mean Plasma Glucose: 148 mg/dL
eAG (mmol/L): 8.2 mmol/L

## 2022-10-13 LAB — TSH: TSH: 1.79 mIU/L (ref 0.40–4.50)

## 2022-10-13 LAB — PSA: PSA: 0.33 ng/mL (ref <=4.00)

## 2022-10-19 ENCOUNTER — Ambulatory Visit: Payer: BC Managed Care – PPO | Admitting: Family Medicine

## 2022-10-19 ENCOUNTER — Encounter: Payer: Self-pay | Admitting: Family Medicine

## 2022-10-19 ENCOUNTER — Other Ambulatory Visit: Payer: Self-pay | Admitting: Family Medicine

## 2022-10-19 VITALS — BP 128/78 | HR 76 | Ht 76.0 in | Wt 229.0 lb

## 2022-10-19 DIAGNOSIS — Z8249 Family history of ischemic heart disease and other diseases of the circulatory system: Secondary | ICD-10-CM

## 2022-10-19 DIAGNOSIS — E1169 Type 2 diabetes mellitus with other specified complication: Secondary | ICD-10-CM | POA: Diagnosis not present

## 2022-10-19 DIAGNOSIS — Z0000 Encounter for general adult medical examination without abnormal findings: Secondary | ICD-10-CM

## 2022-10-19 DIAGNOSIS — Z23 Encounter for immunization: Secondary | ICD-10-CM | POA: Diagnosis not present

## 2022-10-19 MED ORDER — ATORVASTATIN CALCIUM 10 MG PO TABS
10.0000 mg | ORAL_TABLET | Freq: Every day | ORAL | 3 refills | Status: DC
Start: 2022-10-19 — End: 2023-04-18

## 2022-10-19 NOTE — Addendum Note (Signed)
Addended by: Smitty Cords on: 10/19/2022 01:26 PM   Modules accepted: Orders

## 2022-10-19 NOTE — Patient Instructions (Addendum)
Thank you for coming to the office today.  Recent Labs    04/01/22 0757 10/12/22 0838  HGBA1C 6.1* 6.8*    Contact me within 1 month approximately whenever you are closer to needing refill on Mounjaro, confirm that you prefer to stay on 5mg  weekly dose,.  If you do change mind and want to go up to 7.5mg , let me know.  A1c is controlled  For cholesterol  You are at increased risk of future Cardiovascular complications such as Heart Attack or Stroke from an artery blockage due to abnormal cholesterol and/or risk factors. - As discussed, Statin Cholesterol pills both can both LOWER cholesterol and REDUCE this future risk of heart attack and stroke - Start Atorvstatin (generic Liptor) 10mg  pill once at bedtime every night  If you develop mild aches or pains in muscle or joint that does NOT improve or go away after first 3-4 weeks then this may require [place] to adjust the dose. First I would recommend STOPPING the medication for a few weeks until your ache and pain symptoms completely RESOLVE. Then you can restart at a LOWER DOSE either HALF a pill at bedtime every night or LESS OFTEN such as one pill a week only and then gradually increase to every other day or max dose of 3 times a week  Lastly, sometimes we need to try other versions of this medicine to find one that works for you and does not cause side effects.  ----   You have been referred for a Coronary Calcium Score Cardiac CT Scan. This is a screening test for patients aged 73-50+ with cardiovascular risk factors or who are healthy but would be interested in Cardiovascular Screening for heart disease. Even if there is a family history of heart disease, this imaging can be useful. Typically it can be done every 5+ years or at a different timeline we agree on  The scan will look at the chest and mainly focus on the heart and identify early signs of calcium build up or blockages within the heart arteries. It is not 100% accurate for  identifying blockages or heart disease, but it is useful to help [place] predict who may have some early changes or be at risk in the future for a heart attack or cardiovascular problem.  The results are reviewed by a Cardiologist and they will document the results. It should become available on MyChart. Typically the results are divided into percentiles based on other patients of the same demographic and age. So it will compare your risk to others similar to you. If you have a higher score >99 or higher percentile >75%tile, it is recommended to consider Statin cholesterol therapy and or referral to Cardiologist. I will try to help explain your results and if we have questions we can contact the Cardiologist.  You will be contacted for scheduling. Usually it is done at any imaging facility through [facility], [facility] or [facility].  The cost is $99 flat fee total and it does not go through insurance, so no authorization is required.  ---------  DUE for FASTING BLOOD WORK (no food or drink after midnight before the lab appointment, only water or coffee without cream/sugar on the morning of)  SCHEDULE "Lab Only" visit in the morning at the clinic for lab draw in 3 MONTHS   - Make sure Lab Only appointment is at about 1 week before your next appointment, so that results will be available  For Lab Results,  once available within 2-3 days of blood draw, you can can log in to MyChart online to view your results and a brief explanation. Also, we can discuss results at next follow-up visit.   Please schedule a Follow-up Appointment to: Return in about 3 months (around 01/18/2023) for 3 month (early Jan) AM lab only no apt, any day but Mon. then 6 mo DM A1c f/u.  If you have any other questions or concerns, please feel free to call the office or send a message through MyChart. You may also schedule an earlier appointment if necessary.  Additionally, you may be receiving a  survey about your experience at our office within a few days to 1 week by e-mail or mail. We value your feedback.  Isaiah Pilar, DO [facility], [place]

## 2022-10-19 NOTE — Progress Notes (Signed)
Subjective:    Patient ID: Isaiah Doyle., male    DOB: [birth year], 52 y.o.   MRN: [id number]  Isaiah Doyle. is a 52 y.o. male presenting on 10/19/2022 for Annual Exam   HPI  Discussed the use of AI scribe software for clinical note transcription with the patient, who gave verbal consent to proceed.   Here for Annual Physical and Lab Review  CHRONIC DM, Type 2: A1c up to 6.8, prior 6.1 range Improved overall on Mounjaro. Previously 3 months ago was on Ozempic 1mg  He has had improved weight loss on the switch Reduced GI side effects Meds: Mounjaro 5mg  weekly Off Metformin Reports  good compliance. No more GI side effects Currently not on ACEi/ARB Lifestyle: Weight down 15 lbs in past 3 - Diet (goal to improve - Exercise (limited exercise due to time) Denies hypoglycemia, polyuria, visual changes, numbness or tingling  HYPERLIPIDEMIA: - Reports no concerns. Last lipid panel 09/2022, stable improved TG down to 150-160, LDL 130s Not on statin therapy Interested in Statin therapy  Elevated LFTs Fatty Liver on last [place] Lab result still elevated but improved from 1-2 years. Still with elevated AST ALT elevation 68 and 117   Sister dx Hemochromotosis Last lab shows high normal Ferritin. Normal CBC   Essential Hypertension Fam history of HTN Current Meds - OFF Meds Denies CP, dyspnea, HA, edema, dizziness / lightheadedness     BPH Frequency LUTS History of Kidney Stones Doing well without recent kidney stones. No actual current symptoms. He usually takes Flomax PRN but now taking Flomax regularly now needs rx re order due to BPH and urinary frequency   Hearing Loss / Tinnitus He had hearing test by Dr [facility] History of loud music If in noisy situation he has hard time hearing     Health Maintenance:   Flu Shot today  Colon CA Screening, - Last colonoscopy 11/19/21 1 polyp pre cancer removed, repeat 3 years. Next 2026.   PSA 0.33 (2024) previous  0.25 (09/2021)     10/19/2022    8:05 AM 08/22/2022    2:12 PM 04/08/2022   10:56 AM  Depression screen PHQ 2/9  Decreased Interest 0 0 0  Down, Depressed, Hopeless 0 0 0  PHQ - 2 Score 0 0 0  Altered sleeping  0 0  Tired, decreased energy  3 0  Change in appetite  3 0  Feeling bad or failure about yourself   0 0  Trouble concentrating  2 0  Moving slowly or fidgety/restless  0 0  Suicidal thoughts  0 0  PHQ-9 Score  8 0  Difficult doing work/chores  Not difficult at all Not difficult at all    Past Medical History:  Diagnosis Date   Allergy    Diabetes mellitus without complication (HCC)    GERD (gastroesophageal reflux disease)    Past Surgical History:  Procedure Laterality Date   COLONOSCOPY     COLONOSCOPY WITH PROPOFOL N/A 11/01/2019   Procedure: COLONOSCOPY WITH PROPOFOL;  Surgeon: Pasty Spillers, MD;  Location: [facility] ENDOSCOPY;  Service: Endoscopy;  Laterality: N/A;   COLONOSCOPY WITH PROPOFOL N/A 11/13/2020   Procedure: COLONOSCOPY WITH PROPOFOL;  Surgeon: Pasty Spillers, MD;  Location: [facility] ENDOSCOPY;  Service: Gastroenterology;  Laterality: N/A;   COLONOSCOPY WITH PROPOFOL N/A 11/19/2021   Procedure: COLONOSCOPY WITH PROPOFOL;  Surgeon: Wyline Mood, MD;  Location: [facility] ENDOSCOPY;  Service: Gastroenterology;  Laterality: N/A;   ESOPHAGOGASTRODUODENOSCOPY (EGD) WITH PROPOFOL  N/A 11/13/2014   Procedure: ESOPHAGOGASTRODUODENOSCOPY (EGD) WITH PROPOFOL;  Surgeon: Wallace Cullens, MD;  Location: [facility] ENDOSCOPY;  Service: Gastroenterology;  Laterality: N/A;   TONSILLECTOMY     WRIST MASS EXCISION     Social History   Socioeconomic History   Marital status: Married    Spouse name: Not on file   Number of children: Not on file   Years of education: Not on file   Highest education level: Not on file  Occupational History   Not on file  Tobacco Use   Smoking status: Former    Current packs/day: 0.00    Types: Cigarettes    Quit date: 01/25/2008    Years since  quitting: 14.7   Smokeless tobacco: Former   Tobacco comments:    Initially quit 2001, then resumed temporarily 2010  Vaping Use   Vaping status: Never Used  Substance and Sexual Activity   Alcohol use: Yes    Alcohol/week: 12.0 standard drinks of alcohol    Types: 12 Cans of beer per week    Comment: a few shots of liquor in evening, none in the last week   Drug use: No   Sexual activity: Not on file  Other Topics Concern   Not on file  Social History Narrative   Not on file   Social Determinants of Health   Financial Resource Strain: Not on file  Food Insecurity: Not on file  Transportation Needs: Not on file  Physical Activity: Not on file  Stress: Not on file  Social Connections: Not on file  Intimate Partner Violence: Not on file   Family History  Problem Relation Age of Onset   Cancer Mother        BREAST   Heart attack Father 38       7 repeat   Heart disease Father 50   Colon cancer Paternal Grandfather 106   Prostate cancer Neg Hx    Current Outpatient Medications on File Prior to Visit  Medication Sig   acetaminophen (TYLENOL) 500 MG tablet Take 500 mg by mouth every 6 (six) hours as needed.   cetirizine (ZYRTEC) 10 MG tablet Take 10 mg by mouth as needed for allergies.   ibuprofen (ADVIL,MOTRIN) 200 MG tablet Take 200 mg by mouth every 6 (six) hours as needed.   ipratropium (ATROVENT) 0.06 % nasal spray Place 2 sprays into both nostrils 4 (four) times daily. For up to 5-7 days then stop.   MOUNJARO 5 MG/0.5ML Pen Inject 5 mg into the skin once a week.   Multiple Vitamin (MULTIVITAMIN) tablet Take 1 tablet by mouth daily.   omeprazole (PRILOSEC) 20 MG capsule Take 1 capsule (20 mg total) by mouth daily before breakfast.   dicyclomine (BENTYL) 10 MG capsule Take 1 capsule (10 mg total) by mouth 4 (four) times daily -  before meals and at bedtime. As needed for abdominal cramping bloating diarrhea urgency (Patient not taking: Reported on 10/19/2022)   No  current facility-administered medications on file prior to visit.    Review of Systems  Constitutional:  Negative for activity change, appetite change, chills, diaphoresis, fatigue and fever.  HENT:  Negative for congestion and hearing loss.   Eyes:  Negative for visual disturbance.  Respiratory:  Negative for cough, chest tightness, shortness of breath and wheezing.   Cardiovascular:  Negative for chest pain, palpitations and leg swelling.  Gastrointestinal:  Negative for abdominal pain, constipation, diarrhea, nausea and vomiting.  Genitourinary:  Negative for dysuria, frequency  and hematuria.  Musculoskeletal:  Negative for arthralgias and neck pain.  Skin:  Negative for rash.  Neurological:  Negative for dizziness, weakness, light-headedness, numbness and headaches.  Hematological:  Negative for adenopathy.  Psychiatric/Behavioral:  Negative for behavioral problems, dysphoric mood and sleep disturbance.    Per HPI unless specifically indicated above     Objective:    BP 128/78   Pulse 76   Ht 6\' 4"  (1.93 m)   Wt 229 lb (103.9 kg)   SpO2 96%   BMI 27.87 kg/m   Wt Readings from Last 3 Encounters:  10/19/22 229 lb (103.9 kg)  08/22/22 244 lb (110.7 kg)  04/08/22 246 lb (111.6 kg)    Physical Exam Vitals and nursing note reviewed.  Constitutional:      General: He is not in acute distress.    Appearance: He is well-developed. He is not diaphoretic.     Comments: Well-appearing, comfortable, cooperative  HENT:     Head: Normocephalic and atraumatic.  Eyes:     General:        Right eye: No discharge.        Left eye: No discharge.     Conjunctiva/sclera: Conjunctivae normal.     Pupils: Pupils are equal, round, and reactive to light.  Neck:     Thyroid: No thyromegaly.     Vascular: No carotid bruit.  Cardiovascular:     Rate and Rhythm: Normal rate and regular rhythm.     Pulses: Normal pulses.     Heart sounds: Normal heart sounds. No murmur heard. Pulmonary:      Effort: Pulmonary effort is normal. No respiratory distress.     Breath sounds: Normal breath sounds. No wheezing or rales.  Abdominal:     General: Bowel sounds are normal. There is no distension.     Palpations: Abdomen is soft. There is no mass.     Tenderness: There is no abdominal tenderness.  Musculoskeletal:        General: No tenderness. Normal range of motion.     Cervical back: Normal range of motion and neck supple.     Right lower leg: No edema.     Left lower leg: No edema.     Comments: Upper / Lower Extremities: - Normal muscle tone, strength bilateral upper extremities 5/5, lower extremities 5/5  Lymphadenopathy:     Cervical: No cervical adenopathy.  Skin:    General: Skin is warm and dry.     Findings: No erythema or rash.  Neurological:     Mental Status: He is alert and oriented to person, place, and time.     Comments: Distal sensation intact to light touch all extremities  Psychiatric:        Mood and Affect: Mood normal.        Behavior: Behavior normal.        Thought Content: Thought content normal.     Comments: Well groomed, good eye contact, normal speech and thoughts     Diabetic Foot Exam - Simple   Simple Foot Form Diabetic Foot exam was performed with the following findings: Yes 10/19/2022  8:41 AM  Visual Inspection No deformities, no ulcerations, no other skin breakdown bilaterally: Yes Sensation Testing Intact to touch and monofilament testing bilaterally: Yes Pulse Check Posterior Tibialis and Dorsalis pulse intact bilaterally: Yes Comments      Results for orders placed or performed in visit on 10/12/22  TSH  Result Value Ref Range   TSH  1.79 0.40 - 4.50 mIU/L  PSA  Result Value Ref Range   PSA 0.33 < OR = 4.00 ng/mL  Hemoglobin A1c  Result Value Ref Range   Hgb A1c MFr Bld 6.8 (H) <5.7 % of total Hgb   Mean Plasma Glucose 148 mg/dL   eAG (mmol/L) 8.2 mmol/L  Lipid panel  Result Value Ref Range   Cholesterol 208 (H)  <200 mg/dL   HDL 48 > OR = 40 mg/dL   Triglycerides 098 (H) <150 mg/dL   LDL Cholesterol (Calc) 130 (H) mg/dL (calc)   Total CHOL/HDL Ratio 4.3 <5.0 (calc)   Non-HDL Cholesterol (Calc) 160 (H) <130 mg/dL (calc)  CBC with Differential/Platelet  Result Value Ref Range   WBC 6.8 3.8 - 10.8 Thousand/uL   RBC 4.92 4.20 - 5.80 Million/uL   Hemoglobin 15.1 13.2 - 17.1 g/dL   HCT 11.9 14.7 - 82.9 %   MCV 92.3 80.0 - 100.0 fL   MCH 30.7 27.0 - 33.0 pg   MCHC 33.3 32.0 - 36.0 g/dL   RDW 56.2 13.0 - 86.5 %   Platelets 189 140 - 400 Thousand/uL   MPV 10.5 7.5 - 12.5 fL   Neutro Abs 4,026 1,500 - 7,800 cells/uL   Lymphs Abs 2,020 850 - 3,900 cells/uL   Absolute Monocytes 605 200 - 950 cells/uL   Eosinophils Absolute 102 15 - 500 cells/uL   Basophils Absolute 48 0 - 200 cells/uL   Neutrophils Relative % 59.2 %   Total Lymphocyte 29.7 %   Monocytes Relative 8.9 %   Eosinophils Relative 1.5 %   Basophils Relative 0.7 %  COMPLETE METABOLIC PANEL WITH GFR  Result Value Ref Range   Glucose, Bld 113 (H) 65 - 99 mg/dL   BUN 11 7 - 25 mg/dL   Creat 7.84 6.96 - 2.95 mg/dL   eGFR 96 > OR = 60 MW/UXL/2.44W1   BUN/Creatinine Ratio SEE NOTE: 6 - 22 (calc)   Sodium 139 135 - 146 mmol/L   Potassium 4.1 3.5 - 5.3 mmol/L   Chloride 105 98 - 110 mmol/L   CO2 27 20 - 32 mmol/L   Calcium 9.3 8.6 - 10.3 mg/dL   Total Protein 7.5 6.1 - 8.1 g/dL   Albumin 4.7 3.6 - 5.1 g/dL   Globulin 2.8 1.9 - 3.7 g/dL (calc)   AG Ratio 1.7 1.0 - 2.5 (calc)   Total Bilirubin 0.9 0.2 - 1.2 mg/dL   Alkaline phosphatase (APISO) 78 35 - 144 U/L   AST 68 (H) 10 - 35 U/L   ALT 117 (H) 9 - 46 U/L      Assessment & Plan:   Problem List Items Addressed This Visit   None Visit Diagnoses     Annual physical exam    -  Primary   Influenza vaccine needed       Relevant Orders   Flu vaccine trivalent PF, 6mos and older(Flulaval,Afluria,Fluarix,Fluzone) (Completed)       Updated Health Maintenance information Reviewed  recent lab results with patient Encouraged improvement to lifestyle with diet and exercise Goal of weight loss  Assessment and Plan    Type 2 Diabetes Mellitus A1c increased from 6.1 to 6.8 after switching from Ozempic to [facility] 5mg  weekly due to GI side effects. Patient reports better tolerance of Mounjaro and weight loss, but slightly higher glucose levels. -Continue Mounjaro 5mg  weekly. Consider dose inc to 7.5 - he will notify us when ready for refill if needs dose increase  Hyperlipidemia LDL cholesterol stable at 409, goal is under 100. Discussed benefits of statin therapy for cardiovascular prevention. -Start Atorvastatin 10mg  nightly. Reviewed benefits risk potential side effects -Check lipid panel in 3 months.  General Health Maintenance -Administer influenza vaccine today. -Perform foot check today for diabetic neuropathy; no signs of neuropathy noted. -Order urine test for kidney function. -CT scan for cardiovascular risk stratification recommended; patient to consider. -Next colonoscopy due in 2026. -Next diabetic eye exam due in January. -Follow-up appointment in 6 months.        No orders of the defined types were placed in this encounter.    Follow up plan: Return in about 3 months (around 01/18/2023) for 3 month (early Jan) AM lab only no apt, any day but Mon. then 6 mo DM A1c f/u.  CMET + Lipid 3 month results only  Apt 6 month   Saralyn Pilar, DO [facility] 10/19/2022, 8:13 AM

## 2022-10-19 NOTE — Addendum Note (Signed)
Addended by: Smitty Cords on: 10/19/2022 08:38 AM   Modules accepted: Orders

## 2022-10-19 NOTE — Assessment & Plan Note (Deleted)
Controlled without medication Stable maintained

## 2022-10-19 NOTE — Addendum Note (Signed)
Addended by: Smitty Cords on: 10/19/2022 08:35 AM   Modules accepted: Orders

## 2022-10-20 LAB — MICROALBUMIN / CREATININE URINE RATIO
Creatinine, Urine: 129 mg/dL (ref 20–320)
Microalb Creat Ratio: 4 mg/g creat (ref <30)
Microalb, Ur: 0.5 mg/dL

## 2022-10-26 ENCOUNTER — Ambulatory Visit
Admission: RE | Admit: 2022-10-26 | Discharge: 2022-10-26 | Disposition: A | Discharge status: Home / Self Care | Payer: Self-pay | Source: Ambulatory Visit | Attending: Family Medicine | Admitting: Family Medicine

## 2022-10-26 DIAGNOSIS — Z8249 Family history of ischemic heart disease and other diseases of the circulatory system: Secondary | ICD-10-CM | POA: Insufficient documentation

## 2022-10-26 DIAGNOSIS — E785 Hyperlipidemia, unspecified: Secondary | ICD-10-CM | POA: Insufficient documentation

## 2022-10-26 DIAGNOSIS — E1169 Type 2 diabetes mellitus with other specified complication: Secondary | ICD-10-CM | POA: Insufficient documentation

## 2022-11-01 DIAGNOSIS — M5451 Vertebrogenic low back pain: Secondary | ICD-10-CM | POA: Diagnosis not present

## 2022-11-01 DIAGNOSIS — M9901 Segmental and somatic dysfunction of cervical region: Secondary | ICD-10-CM | POA: Diagnosis not present

## 2022-11-01 DIAGNOSIS — M9904 Segmental and somatic dysfunction of sacral region: Secondary | ICD-10-CM | POA: Diagnosis not present

## 2022-11-01 DIAGNOSIS — M9903 Segmental and somatic dysfunction of lumbar region: Secondary | ICD-10-CM | POA: Diagnosis not present

## 2022-11-11 ENCOUNTER — Other Ambulatory Visit: Payer: Self-pay | Admitting: Family Medicine

## 2022-11-11 DIAGNOSIS — E1169 Type 2 diabetes mellitus with other specified complication: Secondary | ICD-10-CM

## 2022-11-11 MED ORDER — MOUNJARO 5 MG/0.5ML ~~LOC~~ SOAJ
5.0000 mg | SUBCUTANEOUS | 0 refills | Status: DC
Start: 2022-11-11 — End: 2022-12-12

## 2022-11-21 ENCOUNTER — Ambulatory Visit: Payer: BC Managed Care – PPO | Admitting: Family Medicine

## 2022-11-21 ENCOUNTER — Encounter: Payer: Self-pay | Admitting: Family Medicine

## 2022-11-21 VITALS — BP 134/68 | Ht 76.0 in | Wt 234.0 lb

## 2022-11-21 DIAGNOSIS — L03012 Cellulitis of left finger: Secondary | ICD-10-CM

## 2022-11-21 MED ORDER — MUPIROCIN 2 % EX OINT: 1.0000 | TOPICAL_OINTMENT | Freq: Two times a day (BID) | CUTANEOUS | 0 refills | Status: DC

## 2022-11-21 MED ORDER — DOXYCYCLINE HYCLATE 100 MG PO TABS
100.0000 mg | ORAL_TABLET | Freq: Two times a day (BID) | ORAL | 0 refills | Status: DC
Start: 2022-11-21 — End: 2023-04-18

## 2022-11-21 NOTE — Patient Instructions (Addendum)
Thank you for coming to the office today.  Start taking Doxycycline antibiotic 100mg  twice daily for 10 days. Take with full glass of water and stay upright for at least 30 min after taking, may be seated or standing, but should NOT lay down. This is just a safety precaution, if this medicine does not go all the way down throat well it could cause some burning discomfort to throat and esophagus.  May use mupirocin ointment if need twice a day for 1-2 weeks  Paronychia Paronychia is an infection of the skin. It happens near a fingernail or toenail. It may cause pain and swelling around the nail. In some cases, a fluid-filled bump (abscess) can form near or under the nail. Often, this condition is not serious, and it clears up with treatment. What are the causes? This condition may be caused by a germ. The germ may be bacteria or a fungus. These germs can enter the body through an opening in the skin, such as a cut or a hangnail. Other causes include: Repeated injuries to your fingernails or toenails. Irritation of the base and sides of the nail (cuticle). What increases the risk? This condition is more likely to develop in people who: Get their hands wet often, such as a dishwasher. Bite their fingernails or the base and sides of their nails. Have other skin problems. Have hangnails or hurt fingertips. Come into contact with chemicals like detergents. Have diabetes. What are the signs or symptoms? Redness and swelling of the skin near the nail. A tender feeling around the nail. Pus-filled bumps under the skin at the base and sides of the nail. Fluid or pus under the nail. Pain in the area. How is this treated? Treatment depends on the cause of your condition and how bad it is. If your condition is mild, it may clear up on its own in a few days or after soaking in warm water. If needed, treatment may include: Antibiotic medicine. Antifungal medicine. A procedure to drain pus from a  fluid-filled bump. Medicine to treat irritation and swelling (corticosteroids). Taking off part of an ingrown toenail. A bandage (dressing) may be placed over the nail area. Follow these instructions at home: Wound care Keep the affected area clean. Soak the fingers or toes in warm water as told by your doctor. You may be told to do this for 20 minutes, 2-3 times a day. Keep the area dry when you are not soaking it. Do not try to drain a fluid-filled bump on your own. Follow instructions from your doctor about how to take care of the affected area. Make sure you: Wash your hands with soap and water for at least 20 seconds before and after you change your bandage. If you cannot use soap and water, use hand sanitizer. Change your bandage as told by your doctor. If you had a fluid-filled bump and your doctor drained it, check the area every day for signs of infection. Check for: Redness, swelling, or pain. Fluid or blood. Warmth. Pus or a bad smell. Medicines  Take over-the-counter and prescription medicines only as told by your doctor. If you were prescribed an antibiotic medicine, take it as told by your doctor. Do not stop taking it even if you start to feel better. General instructions Avoid contact with anything that irritates your skin or that you are allergic to. Do not pick at the affected area. Keep all follow-up visits. Prevention To prevent this condition from happening again: Wear rubber gloves  when putting your hands in water for washing dishes or other tasks. Wear gloves if your hands might touch cleaners or chemicals. Avoid injuring your nails or fingertips. Do not bite your nails or tear hangnails. Do not cut your nails very short. Do not cut the skin at the base and sides of the nail. Use clean nail clippers or scissors when trimming nails. Contact a doctor if: You feel worse. You do not get better. You keep having or you have more fluid, blood, or pus coming from  the affected area. Your affected finger, toe, or joint gets swollen or hard to move. You have a fever or chills. There is redness spreading from the affected area. Summary Paronychia is an infection of the skin. It happens near a fingernail or toenail. This condition may cause pain and swelling around the nail. Soak the fingers or toes in warm water as told by your doctor. Often, this condition is not serious, and it clears up with treatment. This information is not intended to replace advice given to you by your health care provider. Make sure you discuss any questions you have with your health care provider. Document Revised: 04/13/2020 Document Reviewed: 04/13/2020 Elsevier Patient Education  2024 [employer]vier Inc[employer]   Please schedule a Follow-up Appointment to: No follow-ups on file.  If you have any other questions or concerns, please feel free to call the office or send a message through MyChart. You may also schedule an earlier appointment if necessary.  Additionally, you may be receiving a survey about your experience at our office within a few days to 1 week by e-mail or mail. We value your feedback.  Saralyn Pilar, DO [facility], [place]

## 2022-11-21 NOTE — Progress Notes (Unsigned)
Subjective:    Patient ID: Isaiah Face., male    DOB: January 09, 1971, 52 y.o.   MRN: [id number]  Isaiah Engles. is a 52 y.o. male presenting on 11/21/2022 for Finger Injury (Swelling and discomfort middle finger left hand, started Tuesday last week, soak in hydrogen peroxide, unsure of injury)  Patient presents for a same day appointment.   HPI  Discussed the use of AI scribe software for clinical note transcription with the patient, who gave verbal consent to proceed.  Left Middle Finger Paronychia  The patient presented with a complaint of swelling and discomfort in the left middle finger. The symptoms began earlier in the week, with the patient noticing significant swelling and soreness. The patient attempted home remedies, including soaking the finger in hydrogen peroxide, applying ointment, and wrapping it in gauze. This resulted in a temporary increase in discomfort and throbbing, but ultimately led to a reduction in swelling.   The patient has no known trigger for the condition and maintains good care of his nail beds. He reported a previous infection in a different finger, which occurred after a skin break due to dryness and cracking in winter. But this seems different as he does not have any cracked skin. The patient denied any specific treatments for his nails, only keeping them cut short due to playing the guitar.  The patient has no known history of MRSA or other skin infections.   Denies fever chills spreading redness drainage        10/19/2022    8:05 AM 08/22/2022    2:12 PM 04/08/2022   10:56 AM  Depression screen PHQ 2/9  Decreased Interest 0 0 0  Down, Depressed, Hopeless 0 0 0  PHQ - 2 Score 0 0 0  Altered sleeping  0 0  Tired, decreased energy  3 0  Change in appetite  3 0  Feeling bad or failure about yourself   0 0  Trouble concentrating  2 0  Moving slowly or fidgety/restless  0 0  Suicidal thoughts  0 0  PHQ-9 Score  8 0  Difficult doing  work/chores  Not difficult at all Not difficult at all    Social History   Tobacco Use   Smoking status: Former    Current packs/day: 0.00    Types: Cigarettes    Quit date: 01/25/2008    Years since quitting: 14.8   Smokeless tobacco: Former   Tobacco comments:    Initially quit 2001, then resumed temporarily 2010  Vaping Use   Vaping status: Never Used  Substance Use Topics   Alcohol use: Yes    Alcohol/week: 12.0 standard drinks of alcohol    Types: 12 Cans of beer per week    Comment: a few shots of liquor in evening, none in the last week   Drug use: No    Review of Systems Per HPI unless specifically indicated above     Objective:    BP 134/68 (BP Location: Left Arm, Cuff Size: Normal)   Ht 6\' 4"  (1.93 m)   Wt 234 lb (106.1 kg)   BMI 28.48 kg/m   Wt Readings from Last 3 Encounters:  11/21/22 234 lb (106.1 kg)  10/19/22 229 lb (103.9 kg)  08/22/22 244 lb (110.7 kg)    Physical Exam Vitals and nursing note reviewed.  Constitutional:      General: He is not in acute distress.    Appearance: Normal appearance. He is well-developed. He is  not diaphoretic.     Comments: Well-appearing, comfortable, cooperative  HENT:     Head: Normocephalic and atraumatic.  Eyes:     General:        Right eye: No discharge.        Left eye: No discharge.     Conjunctiva/sclera: Conjunctivae normal.  Cardiovascular:     Rate and Rhythm: Normal rate.  Pulmonary:     Effort: Pulmonary effort is normal.  Skin:    General: Skin is warm and dry.     Findings: Erythema (Left middle finger distal phalanx nail bed with paronychia, no ulceration or drainage or extension) present. No rash.  Neurological:     Mental Status: He is alert and oriented to person, place, and time.  Psychiatric:        Mood and Affect: Mood normal.        Behavior: Behavior normal.        Thought Content: Thought content normal.     Comments: Well groomed, good eye contact, normal speech and thoughts      Left Middle Finger  Results for orders placed or performed in visit on 10/19/22  Urine Microalbumin w/creat. ratio  Result Value Ref Range   Creatinine, Urine 129 20 - 320 mg/dL   Microalb, Ur 0.5 mg/dL   Microalb Creat Ratio 4 <30 mg/g creat      Assessment & Plan:   Problem List Items Addressed This Visit   None Visit Diagnoses     Paronychia of left middle finger    -  Primary   Relevant Medications   doxycycline (VIBRA-TABS) 100 MG tablet   mupirocin ointment (BACTROBAN) 2 %       Assessment and Plan    Paronychia of the left middle finger Swelling and discomfort noted. No known trigger. Patient has attempted home treatment with hydrogen peroxide soaks and over-the-counter ointment. -Start Doxycycline 100mg  twice daily for 10 days. -Apply Mupirocin ointment twice daily for 1-2 weeks. -Consider starting oral antibiotic if no improvement with topical treatment alone. Return precautions given       No orders of the defined types were placed in this encounter.    Meds ordered this encounter  Medications   doxycycline (VIBRA-TABS) 100 MG tablet    Sig: Take 1 tablet (100 mg total) by mouth 2 (two) times daily. For 10 days. Take with full glass of water, stay upright 30 min after taking.    Dispense:  20 tablet    Refill:  0   mupirocin ointment (BACTROBAN) 2 %    Sig: Apply 1 Application topically 2 (two) times daily. For 1-2 weeks    Dispense:  22 g    Refill:  0      Follow up plan: Return if symptoms worsen or fail to improve.   Saralyn Pilar, DO [facility] 11/21/2022, 4:32 PM

## 2022-11-22 ENCOUNTER — Ambulatory Visit: Payer: BC Managed Care – PPO | Admitting: Family Medicine

## 2022-11-22 NOTE — Addendum Note (Signed)
Addended by: Smitty Cords on: 11/22/2022 12:23 AM   Modules accepted: Level of Service

## 2022-12-12 ENCOUNTER — Other Ambulatory Visit: Payer: Self-pay | Admitting: Family Medicine

## 2022-12-12 DIAGNOSIS — M5451 Vertebrogenic low back pain: Secondary | ICD-10-CM | POA: Diagnosis not present

## 2022-12-12 DIAGNOSIS — E1169 Type 2 diabetes mellitus with other specified complication: Secondary | ICD-10-CM

## 2022-12-12 DIAGNOSIS — M9904 Segmental and somatic dysfunction of sacral region: Secondary | ICD-10-CM | POA: Diagnosis not present

## 2022-12-12 DIAGNOSIS — M9903 Segmental and somatic dysfunction of lumbar region: Secondary | ICD-10-CM | POA: Diagnosis not present

## 2022-12-12 DIAGNOSIS — M9901 Segmental and somatic dysfunction of cervical region: Secondary | ICD-10-CM | POA: Diagnosis not present

## 2022-12-12 MED ORDER — MOUNJARO 5 MG/0.5ML ~~LOC~~ SOAJ: 5.0000 mg | SUBCUTANEOUS | 0 refills | Status: DC

## 2023-01-03 DIAGNOSIS — M5451 Vertebrogenic low back pain: Secondary | ICD-10-CM | POA: Diagnosis not present

## 2023-01-03 DIAGNOSIS — M9901 Segmental and somatic dysfunction of cervical region: Secondary | ICD-10-CM | POA: Diagnosis not present

## 2023-01-03 DIAGNOSIS — M9904 Segmental and somatic dysfunction of sacral region: Secondary | ICD-10-CM | POA: Diagnosis not present

## 2023-01-03 DIAGNOSIS — M9903 Segmental and somatic dysfunction of lumbar region: Secondary | ICD-10-CM | POA: Diagnosis not present

## 2023-01-07 ENCOUNTER — Other Ambulatory Visit: Payer: Self-pay | Admitting: Family Medicine

## 2023-01-07 DIAGNOSIS — E1169 Type 2 diabetes mellitus with other specified complication: Secondary | ICD-10-CM

## 2023-01-09 ENCOUNTER — Other Ambulatory Visit: Payer: Self-pay

## 2023-01-09 DIAGNOSIS — E1169 Type 2 diabetes mellitus with other specified complication: Secondary | ICD-10-CM

## 2023-01-09 MED ORDER — MOUNJARO 5 MG/0.5ML ~~LOC~~ SOAJ: 5.0000 mg | SUBCUTANEOUS | 0 refills | Status: DC

## 2023-02-06 ENCOUNTER — Other Ambulatory Visit: Payer: Self-pay | Admitting: Family Medicine

## 2023-02-06 DIAGNOSIS — E1169 Type 2 diabetes mellitus with other specified complication: Secondary | ICD-10-CM

## 2023-02-06 MED ORDER — MOUNJARO 5 MG/0.5ML ~~LOC~~ SOAJ: 5.0000 mg | SUBCUTANEOUS | 2 refills | Status: DC

## 2023-02-07 ENCOUNTER — Other Ambulatory Visit: Payer: BC Managed Care – PPO

## 2023-02-07 DIAGNOSIS — E785 Hyperlipidemia, unspecified: Secondary | ICD-10-CM | POA: Diagnosis not present

## 2023-02-07 DIAGNOSIS — E1169 Type 2 diabetes mellitus with other specified complication: Secondary | ICD-10-CM | POA: Diagnosis not present

## 2023-02-08 LAB — COMPLETE METABOLIC PANEL WITH GFR
AG Ratio: 1.7 (calc) (ref 1.0–2.5)
ALT: 82 U/L — ABNORMAL HIGH (ref 9–46)
AST: 51 U/L — ABNORMAL HIGH (ref 10–35)
Albumin: 4.8 g/dL (ref 3.6–5.1)
Alkaline phosphatase (APISO): 59 U/L (ref 35–144)
BUN: 19 mg/dL (ref 7–25)
CO2: 28 mmol/L (ref 20–32)
Calcium: 10.4 mg/dL — ABNORMAL HIGH (ref 8.6–10.3)
Chloride: 106 mmol/L (ref 98–110)
Creat: 1.07 mg/dL (ref 0.70–1.30)
Globulin: 2.8 g/dL (ref 1.9–3.7)
Glucose, Bld: 133 mg/dL — ABNORMAL HIGH (ref 65–99)
Potassium: 3.9 mmol/L (ref 3.5–5.3)
Sodium: 141 mmol/L (ref 135–146)
Total Bilirubin: 0.9 mg/dL (ref 0.2–1.2)
Total Protein: 7.6 g/dL (ref 6.1–8.1)
eGFR: 83 mL/min/{1.73_m2} (ref >=60)

## 2023-02-08 LAB — LIPID PANEL
Cholesterol: 221 mg/dL — ABNORMAL HIGH (ref <200)
HDL: 48 mg/dL (ref >=40)
LDL Cholesterol (Calc): 135 mg/dL — ABNORMAL HIGH
Non-HDL Cholesterol (Calc): 173 mg/dL — ABNORMAL HIGH (ref <130)
Total CHOL/HDL Ratio: 4.6 (calc) (ref <5.0)
Triglycerides: 233 mg/dL — ABNORMAL HIGH (ref <150)

## 2023-02-16 ENCOUNTER — Encounter: Payer: Self-pay | Admitting: Family Medicine

## 2023-03-06 ENCOUNTER — Other Ambulatory Visit: Payer: Self-pay

## 2023-03-06 DIAGNOSIS — E1169 Type 2 diabetes mellitus with other specified complication: Secondary | ICD-10-CM

## 2023-03-06 MED ORDER — MOUNJARO 5 MG/0.5ML ~~LOC~~ SOAJ: 5.0000 mg | SUBCUTANEOUS | 2 refills | Status: DC

## 2023-04-07 ENCOUNTER — Telehealth: Payer: Self-pay

## 2023-04-07 NOTE — Telephone Encounter (Signed)
 Prior Authorization Not Required

## 2023-04-18 ENCOUNTER — Ambulatory Visit: Payer: BC Managed Care – PPO | Admitting: Family Medicine

## 2023-04-18 ENCOUNTER — Other Ambulatory Visit: Payer: Self-pay | Admitting: Family Medicine

## 2023-04-18 ENCOUNTER — Encounter: Payer: Self-pay | Admitting: Family Medicine

## 2023-04-18 VITALS — BP 162/90 | HR 77 | Ht 76.0 in | Wt 245.0 lb

## 2023-04-18 DIAGNOSIS — G720 Drug-induced myopathy: Secondary | ICD-10-CM | POA: Insufficient documentation

## 2023-04-18 DIAGNOSIS — E1169 Type 2 diabetes mellitus with other specified complication: Secondary | ICD-10-CM | POA: Diagnosis not present

## 2023-04-18 DIAGNOSIS — R351 Nocturia: Secondary | ICD-10-CM

## 2023-04-18 DIAGNOSIS — I10 Essential (primary) hypertension: Secondary | ICD-10-CM

## 2023-04-18 DIAGNOSIS — J0110 Acute frontal sinusitis, unspecified: Secondary | ICD-10-CM

## 2023-04-18 DIAGNOSIS — Z7985 Long-term (current) use of injectable non-insulin antidiabetic drugs: Secondary | ICD-10-CM | POA: Diagnosis not present

## 2023-04-18 DIAGNOSIS — E785 Hyperlipidemia, unspecified: Secondary | ICD-10-CM

## 2023-04-18 DIAGNOSIS — G4733 Obstructive sleep apnea (adult) (pediatric): Secondary | ICD-10-CM

## 2023-04-18 DIAGNOSIS — Z0000 Encounter for general adult medical examination without abnormal findings: Secondary | ICD-10-CM

## 2023-04-18 DIAGNOSIS — N401 Enlarged prostate with lower urinary tract symptoms: Secondary | ICD-10-CM

## 2023-04-18 LAB — POCT GLYCOSYLATED HEMOGLOBIN (HGB A1C): Hemoglobin A1C: 5.8 % — AB (ref 4.0–5.6)

## 2023-04-18 MED ORDER — MOUNJARO 7.5 MG/0.5ML ~~LOC~~ SOAJ: 7.5000 mg | SUBCUTANEOUS | 2 refills | Status: DC

## 2023-04-18 MED ORDER — AMOXICILLIN-POT CLAVULANATE 875-125 MG PO TABS: 1.0000 | ORAL_TABLET | Freq: Two times a day (BID) | ORAL | 0 refills | Status: DC

## 2023-04-18 NOTE — Progress Notes (Signed)
 Subjective:    Patient ID: Isaiah Face., male    DOB: 08-31-1970, 53 y.o.   MRN: [id number]  Isaiah Vanwingerden. is a 53 y.o. male presenting on 04/18/2023 for Diabetes, Hypertension, and Obstructive Sleep Apnea   HPI  Discussed the use of AI scribe software for clinical note transcription with the patient, who gave verbal consent to proceed.  History of Present Illness   Isaiah Bearman. is a 53 year old male who presents with diabetes management and sinus issues.    Sinusitis He has been experiencing persistent sinus symptoms since a trip to [place] six weeks ago, including nasal congestion with blood and dry blood upon blowing his nose, a sore throat, ear congestion, and night sweats.   CHRONIC DM, Type 2: A1c improved to 5.8 on Mounjaro Admits difficulty w weight gain still Reduced GI side effects Meds: Mounjaro 5mg  weekly Off Metformin Reports  good compliance. No more GI side effects Currently not on ACEi/ARB Lifestyle: - Diet (goal to improve - Exercise (limited exercise due to time) Upcoming [facility] next apt in Spring/Summer 2025 Denies hypoglycemia, polyuria, visual changes, numbness or tingling   HYPERLIPIDEMIA Drug Induced Myopathy OFF Atorvastatin due to myalgia Not on statin therapy   Sister dx Hemochromotosis Last lab shows high normal Ferritin. Normal CBC   Essential Hypertension Fam history of HYPERTENSION Weight gain now and sleep worsening. Concern for elevated BP readings now. Today. Does not have BP cuff at home. Needs to get new one Current Meds - OFF Meds Admits headache Denies CP, dyspnea, edema, dizziness / lightheadedness     BPH Frequency LUTS History of Kidney Stones Doing well without recent kidney stones. No actual current symptoms. He usually takes Flomax PRN but now taking Flomax regularly now needs rx re order due to BPH and urinary frequency  OSA / Snoring History in 2008 he had sleep study previously and  was on CPAP. Came off CPAP due to weight loss no longer needed. Now worsening weight gain and snoring and awakening.  He consumes alcohol regularly, typically two to three shots in the evening, which he notes helps him relax but may contribute to his sleep disturbances.   Epworth Sleepiness Scale Total Score: 12 Sitting and reading - 3 Watching TV - 3 Sitting inactive in a public place - 2 As a passenger in a car for an hour without a break - 2 Lying down to rest in the afternoon when circumstances permit - 2 Sitting and talking to someone - 0 Sitting quietly after a lunch without alcohol - 0 In a car, while stopped for a few minutes in traffic - 0       04/18/2023    4:25 PM 10/19/2022    8:05 AM 08/22/2022    2:12 PM  Depression screen PHQ 2/9  Decreased Interest 0 0 0  Down, Depressed, Hopeless 0 0 0  PHQ - 2 Score 0 0 0  Altered sleeping 1  0  Tired, decreased energy 1  3  Change in appetite 0  3  Feeling bad or failure about yourself  0  0  Trouble concentrating 0  2  Moving slowly or fidgety/restless 0  0  Suicidal thoughts 0  0  PHQ-9 Score 2  8  Difficult doing work/chores Somewhat difficult  Not difficult at all       04/18/2023    4:25 PM 10/19/2022    8:05 AM 08/22/2022  2:13 PM 04/08/2022   10:57 AM  GAD 7 : Generalized Anxiety Score  Nervous, Anxious, on Edge 0 0 2 0  Control/stop worrying 0 0 0   Worry too much - different things 0 0 0 0  Trouble relaxing 1 0 2 0  Restless 1 0 0 0  Easily annoyed or irritable 1 0 0 0  Afraid - awful might happen 0 0 0 0  Total GAD 7 Score 3 0 4   Anxiety Difficulty Somewhat difficult  Not difficult at all Not difficult at all    Social History   Tobacco Use   Smoking status: Former    Current packs/day: 0.00    Types: Cigarettes    Quit date: 01/25/2008    Years since quitting: 15.2   Smokeless tobacco: Former   Tobacco comments:    Initially quit 2001, then resumed temporarily 2010  Vaping Use   Vaping status:  Never Used  Substance Use Topics   Alcohol use: Yes    Alcohol/week: 12.0 standard drinks of alcohol    Types: 12 Cans of beer per week    Comment: a few shots of liquor in evening, none in the last week   Drug use: No    Review of Systems Per HPI unless specifically indicated above     Objective:    BP (!) 162/90 (BP Location: Left Arm, Cuff Size: Normal)   Pulse 77   Ht 6\' 4"  (1.93 m)   Wt 245 lb (111.1 kg)   SpO2 99%   BMI 29.82 kg/m   Wt Readings from Last 3 Encounters:  04/18/23 245 lb (111.1 kg)  11/21/22 234 lb (106.1 kg)  10/19/22 229 lb (103.9 kg)    Physical Exam Vitals and nursing note reviewed.  Constitutional:      General: He is not in acute distress.    Appearance: He is well-developed. He is obese. He is not diaphoretic.     Comments: Well-appearing, comfortable, cooperative  HENT:     Head: Normocephalic and atraumatic.  Eyes:     General:        Right eye: No discharge.        Left eye: No discharge.     Conjunctiva/sclera: Conjunctivae normal.  Neck:     Thyroid: No thyromegaly.  Cardiovascular:     Rate and Rhythm: Normal rate and regular rhythm.     Pulses: Normal pulses.     Heart sounds: Normal heart sounds. No murmur heard. Pulmonary:     Effort: Pulmonary effort is normal. No respiratory distress.     Breath sounds: Normal breath sounds. No wheezing or rales.  Musculoskeletal:        General: Normal range of motion.     Cervical back: Normal range of motion and neck supple.     Right lower leg: No edema.     Left lower leg: No edema.  Lymphadenopathy:     Cervical: No cervical adenopathy.  Skin:    General: Skin is warm and dry.     Findings: No erythema or rash.  Neurological:     Mental Status: He is alert and oriented to person, place, and time. Mental status is at baseline.  Psychiatric:        Behavior: Behavior normal.     Comments: Well groomed, good eye contact, normal speech and thoughts      I have personally  reviewed the radiology report from 10/26/22 on CT Coronary Calcium.  ADDENDUM  REPORT: 11/06/2022 13:32   EXAM: OVER-READ INTERPRETATION  CT CHEST   The following report is an over-read performed by radiologist Dr. Royal Piedra [facility] on 11/06/2022. This over-read does not include interpretation of cardiac or coronary anatomy or pathology. The coronary calcium score interpretation by the cardiologist is attached.   COMPARISON:  None available.   FINDINGS: Within the visualized portions of the thorax there are no suspicious appearing pulmonary nodules or masses, there is no acute consolidative airspace disease, no pleural effusions, no pneumothorax and no lymphadenopathy. Atherosclerotic calcifications in the thoracic aorta. Visualized portions of the upper abdomen are unremarkable. There are no aggressive appearing lytic or blastic lesions noted in the visualized portions of the skeleton.   IMPRESSION: 1. Aortic Atherosclerosis (ICD10-I70.0).     Electronically Signed   By: Trudie Reed M.D.   On: 11/06/2022 13:32    Addended by Florencia Reasons, MD on 11/06/2022  1:34 PM    Study Result  Narrative & Impression  CLINICAL DATA:  Risk stratification   EXAM: Coronary Calcium Score   TECHNIQUE: The patient was scanned on a Siemens Somatom scanner. Axial non-contrast 3 mm slices were carried out through the heart. The data set was analyzed on a dedicated work station and scored using the Agatson method.   FINDINGS: Non-cardiac: See separate report from [facility].   Ascending Aorta: Normal size   Pericardium: Normal   Coronary arteries: Normal origin of left and right coronary arteries. Distribution of arterial calcifications if present, as noted below;   LM 0   LAD 99   LCx 0   RCA 33.2   Total 132   IMPRESSION AND RECOMMENDATION: 1. Coronary calcium score of 132. This was 87th percentile for age and sex matched  control.   2. CAC 100-299 in LAD, RCA. CAC-DRS A2/N2.   3. Recommend aspirin and statin if no contraindication.   4. Continue heart healthy lifestyle and risk factor modification.   Electronically Signed: By: Debbe Odea M.D. On: 10/26/2022 16:58      Results for orders placed or performed in visit on 04/18/23  POCT HgB A1C   Collection Time: 04/18/23  3:39 PM  Result Value Ref Range   Hemoglobin A1C 5.8 (A) 4.0 - 5.6 %   HbA1c POC (<> result, manual entry)     HbA1c, POC (prediabetic range)     HbA1c, POC (controlled diabetic range)        Assessment & Plan:   Problem List Items Addressed This Visit     Drug-induced myopathy   Essential hypertension   Hyperlipidemia associated with type 2 diabetes mellitus (HCC)   Relevant Medications   MOUNJARO 7.5 MG/0.5ML Pen   Type 2 diabetes mellitus with other specified complication (HCC) - Primary   Relevant Medications   MOUNJARO 7.5 MG/0.5ML Pen   Other Relevant Orders   POCT HgB A1C (Completed)   Other Visit Diagnoses       Long-term current use of injectable noninsulin antidiabetic medication         Acute non-recurrent frontal sinusitis       Relevant Medications   amoxicillin-clavulanate (AUGMENTIN) 875-125 MG tablet      Sinusitis Persistent symptoms suggest bacterial sinus infection. Augmentin chosen for coverage. - Prescribe Augmentin. - Reassess if no improvement.  Hypertension Elevated reading now with SBP 160-170, previous baseline 130s, off of medication had history of medication intolerance. Suspected sleep apnea contributing, question if evening alcohol can be related, also headache  likely secondary to BP, worsening weight gain is a factor . Discussed weight loss importance. - Order home sleep study HST - [facility] - Encourage weight loss. - Offer medication, we agree to hold today given his intolerance to med in past, he will Monitor blood pressure at home.  Obstructive Sleep Apnea  (OSA) Previous diagnosis. Had resolved w/ weight loss. Symptoms suggest OSA recurrence. Weight gain may exacerbate. Home sleep study to confirm diagnosis. - Order home sleep study through [facility]. - If OSA confirmed, proceed with CPAP ordering.  Type 2 Diabetes Mellitus A1c at 5.8%, good control. Mounjaro effective. Dose increase may aid weight loss. - Increase Mounjaro dose from 5 mg to 7.5 mg. - Continue monitoring A1c levels.  Hyperlipidemia Drug Induced Myopathy, statin Discontinued atorvastatin due to side effects. Statins important for cardiovascular risk reduction. Revisit in June. Prior CT Coronary Calcium score suggested some build up in arteries and recommended statin - Reassess lipid levels and statin therapy in June. - Consider alternative lipid-lowering therapies if necessary.   Referral to [facility] route to [facility]  Orders Placed This Encounter  Procedures   POCT HgB A1C    Meds ordered this encounter  Medications   amoxicillin-clavulanate (AUGMENTIN) 875-125 MG tablet    Sig: Take 1 tablet by mouth 2 (two) times daily.    Dispense:  20 tablet    Refill:  0   MOUNJARO 7.5 MG/0.5ML Pen    Sig: Inject 7.5 mg into the skin once a week.    Dispense:  2 mL    Refill:  2    Dose increase 5 to 7.5mg     Follow up plan: Return in about 3 months (around 07/19/2023) for 3 month fasting lab > 1 week later Annual Physical.  Future labs ordered for 07/20/23   Saralyn Pilar, DO [facility] 04/18/2023, 3:50 PM

## 2023-04-18 NOTE — Patient Instructions (Addendum)
 Thank you for coming to the office today.  Start Augmentin for sinusitis  BP elevated today It may be Sleep Apnea  Dose increase Mounjaro 5mg  to 7.5mg   [facility] Home Sleep Study  If again we confirm Sleep Apnea - will proceed w/ CPAP ordering  This can help control the blood pressure without medication.  Hold off of Atorvastatin for now.   DUE for FASTING BLOOD WORK (no food or drink after midnight before the lab appointment, only water or coffee without cream/sugar on the morning of)  SCHEDULE "Lab Only" visit in the morning at the clinic for lab draw in 3 MONTHS   - Make sure Lab Only appointment is at about 1 week before your next appointment, so that results will be available  For Lab Results, once available within 2-3 days of blood draw, you can can log in to MyChart online to view your results and a brief explanation. Also, we can discuss results at next follow-up visit.   Please schedule a Follow-up Appointment to: Return in about 3 months (around 07/19/2023) for 3 month fasting lab > 1 week later Annual Physical.  If you have any other questions or concerns, please feel free to call the office or send a message through MyChart. You may also schedule an earlier appointment if necessary.  Additionally, you may be receiving a survey about your experience at our office within a few days to 1 week by e-mail or mail. We value your feedback.  Saralyn Pilar, DO [facility], [place]

## 2023-04-29 DIAGNOSIS — G4730 Sleep apnea, unspecified: Secondary | ICD-10-CM | POA: Diagnosis not present

## 2023-05-01 DIAGNOSIS — D2261 Melanocytic nevi of right upper limb, including shoulder: Secondary | ICD-10-CM | POA: Diagnosis not present

## 2023-05-01 DIAGNOSIS — D2272 Melanocytic nevi of left lower limb, including hip: Secondary | ICD-10-CM | POA: Diagnosis not present

## 2023-05-01 DIAGNOSIS — D485 Neoplasm of uncertain behavior of skin: Secondary | ICD-10-CM | POA: Diagnosis not present

## 2023-05-01 DIAGNOSIS — D225 Melanocytic nevi of trunk: Secondary | ICD-10-CM | POA: Diagnosis not present

## 2023-05-01 DIAGNOSIS — L309 Dermatitis, unspecified: Secondary | ICD-10-CM | POA: Diagnosis not present

## 2023-05-01 DIAGNOSIS — D2262 Melanocytic nevi of left upper limb, including shoulder: Secondary | ICD-10-CM | POA: Diagnosis not present

## 2023-05-01 DIAGNOSIS — D235 Other benign neoplasm of skin of trunk: Secondary | ICD-10-CM | POA: Diagnosis not present

## 2023-05-04 ENCOUNTER — Encounter: Payer: Self-pay | Admitting: Family Medicine

## 2023-05-04 NOTE — Telephone Encounter (Signed)
 Tried [facility], they are currently closed for lunch

## 2023-05-19 LAB — HM DIABETES EYE EXAM

## 2023-05-21 ENCOUNTER — Encounter: Payer: Self-pay | Admitting: Family Medicine

## 2023-05-23 ENCOUNTER — Other Ambulatory Visit: Payer: Self-pay | Admitting: Family Medicine

## 2023-05-23 DIAGNOSIS — K219 Gastro-esophageal reflux disease without esophagitis: Secondary | ICD-10-CM

## 2023-05-25 NOTE — Telephone Encounter (Signed)
 Requested Prescriptions  Pending Prescriptions Disp Refills   omeprazole  (PRILOSEC) 20 MG capsule [Pharmacy Med Name: OMEPRAZOLE  DR 20 MG CAPSULE] 90 capsule 0    Sig: TAKE 1 CAPSULE BY MOUTH DAILY BEFORE BREAKFAST.     Gastroenterology: Proton Pump Inhibitors Passed - 05/25/2023 11:41 AM      Passed - Valid encounter within last 12 months    Recent Outpatient Visits           1 month ago Type 2 diabetes mellitus with other specified complication, without long-term current use of insulin [facility])   [facility], [place], [place]

## 2023-07-03 DIAGNOSIS — M9903 Segmental and somatic dysfunction of lumbar region: Secondary | ICD-10-CM | POA: Diagnosis not present

## 2023-07-03 DIAGNOSIS — M9904 Segmental and somatic dysfunction of sacral region: Secondary | ICD-10-CM | POA: Diagnosis not present

## 2023-07-03 DIAGNOSIS — G4733 Obstructive sleep apnea (adult) (pediatric): Secondary | ICD-10-CM | POA: Diagnosis not present

## 2023-07-03 DIAGNOSIS — M5451 Vertebrogenic low back pain: Secondary | ICD-10-CM | POA: Diagnosis not present

## 2023-07-03 DIAGNOSIS — M9901 Segmental and somatic dysfunction of cervical region: Secondary | ICD-10-CM | POA: Diagnosis not present

## 2023-07-20 ENCOUNTER — Other Ambulatory Visit

## 2023-07-20 DIAGNOSIS — E785 Hyperlipidemia, unspecified: Secondary | ICD-10-CM | POA: Diagnosis not present

## 2023-07-20 DIAGNOSIS — I10 Essential (primary) hypertension: Secondary | ICD-10-CM | POA: Diagnosis not present

## 2023-07-20 DIAGNOSIS — Z0000 Encounter for general adult medical examination without abnormal findings: Secondary | ICD-10-CM

## 2023-07-20 DIAGNOSIS — E1169 Type 2 diabetes mellitus with other specified complication: Secondary | ICD-10-CM | POA: Diagnosis not present

## 2023-07-20 DIAGNOSIS — R351 Nocturia: Secondary | ICD-10-CM

## 2023-07-20 DIAGNOSIS — N401 Enlarged prostate with lower urinary tract symptoms: Secondary | ICD-10-CM | POA: Diagnosis not present

## 2023-07-21 LAB — CBC WITH DIFFERENTIAL/PLATELET
Absolute Lymphocytes: 2009 {cells}/uL (ref 850–3900)
Absolute Monocytes: 631 {cells}/uL (ref 200–950)
Basophils Absolute: 52 {cells}/uL (ref 0–200)
Basophils Relative: 0.8 %
Eosinophils Absolute: 150 {cells}/uL (ref 15–500)
Eosinophils Relative: 2.3 %
HCT: 44.6 % (ref 38.5–50.0)
Hemoglobin: 15 g/dL (ref 13.2–17.1)
MCH: 30.8 pg (ref 27.0–33.0)
MCHC: 33.6 g/dL (ref 32.0–36.0)
MCV: 91.6 fL (ref 80.0–100.0)
MPV: 10.6 fL (ref 7.5–12.5)
Monocytes Relative: 9.7 %
Neutro Abs: 3660 {cells}/uL (ref 1500–7800)
Neutrophils Relative %: 56.3 %
Platelets: 184 10*3/uL (ref 140–400)
RBC: 4.87 10*6/uL (ref 4.20–5.80)
RDW: 12.8 % (ref 11.0–15.0)
Total Lymphocyte: 30.9 %
WBC: 6.5 10*3/uL (ref 3.8–10.8)

## 2023-07-21 LAB — COMPLETE METABOLIC PANEL WITHOUT GFR
AG Ratio: 1.7 (calc) (ref 1.0–2.5)
ALT: 125 U/L — ABNORMAL HIGH (ref 9–46)
AST: 63 U/L — ABNORMAL HIGH (ref 10–35)
Albumin: 4.7 g/dL (ref 3.6–5.1)
Alkaline phosphatase (APISO): 54 U/L (ref 35–144)
BUN: 15 mg/dL (ref 7–25)
CO2: 26 mmol/L (ref 20–32)
Calcium: 9.3 mg/dL (ref 8.6–10.3)
Chloride: 107 mmol/L (ref 98–110)
Creat: 1 mg/dL (ref 0.70–1.30)
Globulin: 2.8 g/dL (ref 1.9–3.7)
Glucose, Bld: 117 mg/dL — ABNORMAL HIGH (ref 65–99)
Potassium: 3.9 mmol/L (ref 3.5–5.3)
Sodium: 141 mmol/L (ref 135–146)
Total Bilirubin: 0.6 mg/dL (ref 0.2–1.2)
Total Protein: 7.5 g/dL (ref 6.1–8.1)

## 2023-07-21 LAB — LIPID PANEL
Cholesterol: 216 mg/dL — ABNORMAL HIGH (ref <200)
HDL: 47 mg/dL (ref >=40)
LDL Cholesterol (Calc): 132 mg/dL — ABNORMAL HIGH
Non-HDL Cholesterol (Calc): 169 mg/dL — ABNORMAL HIGH (ref <130)
Total CHOL/HDL Ratio: 4.6 (calc) (ref <5.0)
Triglycerides: 229 mg/dL — ABNORMAL HIGH (ref <150)

## 2023-07-21 LAB — HEMOGLOBIN A1C
Hgb A1c MFr Bld: 6.1 % — ABNORMAL HIGH (ref <5.7)
Mean Plasma Glucose: 128 mg/dL
eAG (mmol/L): 7.1 mmol/L

## 2023-07-21 LAB — MICROALBUMIN / CREATININE URINE RATIO
Creatinine, Urine: 213 mg/dL (ref 20–320)
Microalb Creat Ratio: 5 mg/g{creat} (ref <30)
Microalb, Ur: 1.1 mg/dL

## 2023-07-21 LAB — TSH: TSH: 1.29 m[IU]/L (ref 0.40–4.50)

## 2023-07-21 LAB — PSA: PSA: 0.33 ng/mL (ref <=4.00)

## 2023-07-26 ENCOUNTER — Other Ambulatory Visit: Payer: Self-pay | Admitting: Family Medicine

## 2023-07-26 ENCOUNTER — Encounter: Payer: Self-pay | Admitting: Family Medicine

## 2023-07-26 ENCOUNTER — Ambulatory Visit (INDEPENDENT_AMBULATORY_CARE_PROVIDER_SITE_OTHER): Admitting: Family Medicine

## 2023-07-26 VITALS — BP 142/80 | HR 72 | Ht 76.0 in | Wt 247.4 lb

## 2023-07-26 DIAGNOSIS — E1169 Type 2 diabetes mellitus with other specified complication: Secondary | ICD-10-CM

## 2023-07-26 DIAGNOSIS — Z0000 Encounter for general adult medical examination without abnormal findings: Secondary | ICD-10-CM

## 2023-07-26 DIAGNOSIS — K219 Gastro-esophageal reflux disease without esophagitis: Secondary | ICD-10-CM

## 2023-07-26 MED ORDER — ASPIRIN 81 MG PO CHEW: 81.0000 mg | CHEWABLE_TABLET | Freq: Every day | ORAL | Status: AC

## 2023-07-26 MED ORDER — MOUNJARO 10 MG/0.5ML ~~LOC~~ SOAJ: 10.0000 mg | SUBCUTANEOUS | 0 refills | Status: DC

## 2023-07-26 MED ORDER — OMEPRAZOLE 20 MG PO CPDR: 20.0000 mg | DELAYED_RELEASE_CAPSULE | Freq: Every day | ORAL | 3 refills | Status: AC

## 2023-07-26 NOTE — Patient Instructions (Addendum)
 Thank you for coming to the office today.  Check [email or web address] to sign up for virtual dietician if interested.  Goal for more meal planning prepping if possible.  Dose increase Mounjaro  7.5 to 10mg  - 30 day on the mounjaro , let me know if we keep this dose or adjust  Added refills Omeprazole   Please schedule a Follow-up Appointment to: Return in about 5 months (around 12/18/2023) for 12/18/2023 for follow-up DM A1c.  If you have any other questions or concerns, please feel free to call the office or send a message through MyChart. You may also schedule an earlier appointment if necessary.  Additionally, you may be receiving a survey about your experience at our office within a few days to 1 week by e-mail or mail. We value your feedback.  Marsa Officer, DO [facility], [place]

## 2023-07-26 NOTE — Progress Notes (Unsigned)
 Subjective:    Patient ID: Isaiah VEAR Honore Mickey., male    DOB: January 10, 1971, 53 y.o.   MRN: [id number]  Isaiah Gohman. is a 53 y.o. male presenting on 07/26/2023 for Annual Exam   HPI  Discussed the use of AI scribe software for clinical note transcription with the patient, who gave verbal consent to proceed.  History of Present Illness   Isaiah Rickey. is a 53 year old male who presents for an annual physical exam.  Hepatic dysfunction and elevated liver enzymes - Oscillating elevated liver enzymes over the past three years - Ultrasound performed two years ago showed mild hepatic steatosis - No fluid retention, unusual skin sores, or additional digestive concerns aside from gastrointestinal discomfort  Obstructive sleep apnea and cpap therapy - Using CPAP for approximately one month - Improved morning energy levels - Persistent fatigue by end of day - [facility]  Tinnitus - Chronic tinnitus in the left ear for the past ten years  [facility] 2 biopsy R leg and back, negative, surveillance  Dermatologic surveillance - Dermatological evaluation in April with two biopsies (right leg and back), both negative - Follow-up dermatology appointment scheduled for next year    CHRONIC DM, Type 2: - Hemoglobin A1c increased from 5.8 to 6.1 - Using Mounjaro  7.5 mg for weight management - Weight has increased despite therapy - Evening hyperphagia with subsequent gastrointestinal discomfort Meds: Mounjaro  5mg  weekly Off Metformin  Reports  good compliance. Currently not on ACEi/ARB Denies hypoglycemia, polyuria, visual changes, numbness or tingling  HYPERLIPIDEMIA Drug Induced Myopathy - LDL cholesterol currently in the 130s without statin therapy OFF Atorvastatin  due to myalgia Not on statin therapy   Sister dx Hemochromotosis Last lab shows high normal Ferritin. Normal CBC   Essential Hypertension Fam history of HYPERTENSION - Blood pressure elevated for  an extended period - Improvement in blood pressure since March - Not monitoring blood pressure at home despite having a monitor Current Meds - OFF Meds Admits headache Denies CP, dyspnea, edema, dizziness / lightheadedness     BPH Frequency LUTS History of Kidney Stones Doing well without recent kidney stones. No actual current symptoms. He usually takes Flomax  PRN but now taking Flomax  regularly now needs rx re order due to BPH and urinary frequency     Health Maintenance:    Colon CA Screening, - Last colonoscopy 11/19/21 1 polyp pre cancer removed, repeat 3 years. Next 2026.   PSA 0.33 (2024) previous 0.25 (09/2021)  Prevnar-20 due next year 08/2024, 5 years since previous     07/26/2023    3:23 PM 04/18/2023    4:25 PM 10/19/2022    8:05 AM  Depression screen PHQ 2/9  Decreased Interest 0 0 0  Down, Depressed, Hopeless 0 0 0  PHQ - 2 Score 0 0 0  Altered sleeping 0 1   Tired, decreased energy 1 1   Change in appetite 0 0   Feeling bad or failure about yourself  0 0   Trouble concentrating 0 0   Moving slowly or fidgety/restless 0 0   Suicidal thoughts 0 0   PHQ-9 Score 1 2   Difficult doing work/chores Not difficult at all Somewhat difficult        07/26/2023    3:23 PM 04/18/2023    4:25 PM 10/19/2022    8:05 AM 08/22/2022    2:13 PM  GAD 7 : Generalized Anxiety Score  Nervous, Anxious, on Edge 0 0 0 2  Control/stop worrying 0 0 0 0  Worry too much - different things 0 0 0 0  Trouble relaxing 0 1 0 2  Restless 0 1 0 0  Easily annoyed or irritable 0 1 0 0  Afraid - awful might happen 0 0 0 0  Total GAD 7 Score 0 3 0 4  Anxiety Difficulty  Somewhat difficult  Not difficult at all     Past Medical History:  Diagnosis Date   Allergy    Diabetes mellitus without complication (HCC)    GERD (gastroesophageal reflux disease)    Past Surgical History:  Procedure Laterality Date   COLONOSCOPY     COLONOSCOPY WITH PROPOFOL  N/A 11/01/2019   Procedure: COLONOSCOPY  WITH PROPOFOL ;  Surgeon: Janalyn Keene NOVAK, MD;  Location: [facility] ENDOSCOPY;  Service: Endoscopy;  Laterality: N/A;   COLONOSCOPY WITH PROPOFOL  N/A 11/13/2020   Procedure: COLONOSCOPY WITH PROPOFOL ;  Surgeon: Janalyn Keene NOVAK, MD;  Location: [facility] ENDOSCOPY;  Service: Gastroenterology;  Laterality: N/A;   COLONOSCOPY WITH PROPOFOL  N/A 11/19/2021   Procedure: COLONOSCOPY WITH PROPOFOL ;  Surgeon: Therisa Bi, MD;  Location: [facility] ENDOSCOPY;  Service: Gastroenterology;  Laterality: N/A;   ESOPHAGOGASTRODUODENOSCOPY (EGD) WITH PROPOFOL  N/A 11/13/2014   Procedure: ESOPHAGOGASTRODUODENOSCOPY (EGD) WITH PROPOFOL ;  Surgeon: Deward CINDERELLA Piedmont, MD;  Location: [facility] ENDOSCOPY;  Service: Gastroenterology;  Laterality: N/A;   TONSILLECTOMY     WRIST MASS EXCISION     Social History   Socioeconomic History   Marital status: Married    Spouse name: Not on file   Number of children: Not on file   Years of education: Not on file   Highest education level: Not on file  Occupational History   Not on file  Tobacco Use   Smoking status: Former    Current packs/day: 0.00    Types: Cigarettes    Quit date: 01/25/2008    Years since quitting: 15.5   Smokeless tobacco: Former   Tobacco comments:    Initially quit 2001, then resumed temporarily 2010  Vaping Use   Vaping status: Never Used  Substance and Sexual Activity   Alcohol use: Yes    Alcohol/week: 12.0 standard drinks of alcohol    Types: 12 Cans of beer per week    Comment: a few shots of liquor in evening, none in the last week   Drug use: No   Sexual activity: Not on file  Other Topics Concern   Not on file  Social History Narrative   Not on file   Social Drivers of Health   Financial Resource Strain: Not on file  Food Insecurity: Not on file  Transportation Needs: Not on file  Physical Activity: Not on file  Stress: Not on file  Social Connections: Not on file  Intimate Partner Violence: Not on file   Family History  Problem Relation  Age of Onset   Cancer Mother        BREAST   Heart attack Father 5       31 repeat   Heart disease Father 63   Colon cancer Paternal Grandfather 72   Prostate cancer Neg Hx    Current Outpatient Medications on File Prior to Visit  Medication Sig   acetaminophen  (TYLENOL ) 500 MG tablet Take 500 mg by mouth every 6 (six) hours as needed.   cetirizine (ZYRTEC) 10 MG tablet Take 10 mg by mouth as needed for allergies.   ibuprofen (ADVIL,MOTRIN) 200 MG tablet Take 200 mg by mouth every 6 (six) hours  as needed.   Multiple Vitamin (MULTIVITAMIN) tablet Take 1 tablet by mouth daily.   No current facility-administered medications on file prior to visit.    Review of Systems  Constitutional:  Negative for activity change, appetite change, chills, diaphoresis, fatigue and fever.  HENT:  Negative for congestion and hearing loss.   Eyes:  Negative for visual disturbance.  Respiratory:  Negative for cough, chest tightness, shortness of breath and wheezing.   Cardiovascular:  Negative for chest pain, palpitations and leg swelling.  Gastrointestinal:  Negative for abdominal pain, constipation, diarrhea, nausea and vomiting.  Genitourinary:  Negative for dysuria, frequency and hematuria.  Musculoskeletal:  Negative for arthralgias and neck pain.  Skin:  Negative for rash.  Neurological:  Negative for dizziness, weakness, light-headedness, numbness and headaches.  Hematological:  Negative for adenopathy.  Psychiatric/Behavioral:  Negative for behavioral problems, dysphoric mood and sleep disturbance.    Per HPI unless specifically indicated above     Objective:    BP (!) 142/80 (BP Location: Left Arm, Cuff Size: Normal)   Pulse 72   Ht 6' 4 (1.93 m)   Wt 247 lb 6 oz (112.2 kg)   SpO2 95%   BMI 30.11 kg/m   Wt Readings from Last 3 Encounters:  07/26/23 247 lb 6 oz (112.2 kg)  04/18/23 245 lb (111.1 kg)  11/21/22 234 lb (106.1 kg)    Physical Exam Vitals and nursing note reviewed.   Constitutional:      General: He is not in acute distress.    Appearance: He is well-developed. He is not diaphoretic.     Comments: Well-appearing, comfortable, cooperative  HENT:     Head: Normocephalic and atraumatic.  Eyes:     General:        Right eye: No discharge.        Left eye: No discharge.     Conjunctiva/sclera: Conjunctivae normal.     Pupils: Pupils are equal, round, and reactive to light.  Neck:     Thyroid: No thyromegaly.     Vascular: No carotid bruit.  Cardiovascular:     Rate and Rhythm: Normal rate and regular rhythm.     Pulses: Normal pulses.     Heart sounds: Normal heart sounds. No murmur heard. Pulmonary:     Effort: Pulmonary effort is normal. No respiratory distress.     Breath sounds: Normal breath sounds. No wheezing or rales.  Abdominal:     General: Bowel sounds are normal. There is no distension.     Palpations: Abdomen is soft. There is no mass.     Tenderness: There is no abdominal tenderness.  Musculoskeletal:        General: No tenderness. Normal range of motion.     Cervical back: Normal range of motion and neck supple.     Right lower leg: No edema.     Left lower leg: No edema.     Comments: Upper / Lower Extremities: - Normal muscle tone, strength bilateral upper extremities 5/5, lower extremities 5/5  Lymphadenopathy:     Cervical: No cervical adenopathy.  Skin:    General: Skin is warm and dry.     Findings: No erythema or rash.  Neurological:     Mental Status: He is alert and oriented to person, place, and time.     Comments: Distal sensation intact to light touch all extremities  Psychiatric:        Mood and Affect: Mood normal.  Behavior: Behavior normal.        Thought Content: Thought content normal.     Comments: Well groomed, good eye contact, normal speech and thoughts     I have personally reviewed the radiology report from 10/14/21 on RUQ Abd US .  CLINICAL DATA:  Elevated LFTs.  Paddock steatosis.    EXAM: ULTRASOUND ABDOMEN LIMITED RIGHT UPPER QUADRANT   COMPARISON:  Abdominal ultrasound September 29, 2009.   FINDINGS: Gallbladder:   No gallstones or wall thickening visualized. No sonographic Murphy sign noted by sonographer.   Common bile duct:   Diameter: 2 mm   Liver:   Mild increased echogenicity. No liver mass. Portal vein is patent on color Doppler imaging with normal direction of blood flow towards the liver.   Other: None.   IMPRESSION: Mild increased echogenicity in the liver is nonspecific but often due to hepatic steatosis. No other abnormalities.     Electronically Signed   By: Alm Pouch III M.D.   On: 10/14/2021 17:57  Results for orders placed or performed in visit on 07/20/23  TSH   Collection Time: 07/20/23  7:53 AM  Result Value Ref Range   TSH 1.29 0.40 - 4.50 mIU/L  Microalbumin / creatinine urine ratio   Collection Time: 07/20/23  7:53 AM  Result Value Ref Range   Creatinine, Urine 213 20 - 320 mg/dL   Microalb, Ur 1.1 mg/dL   Microalb Creat Ratio 5 <30 mg/g creat  PSA   Collection Time: 07/20/23  7:53 AM  Result Value Ref Range   PSA 0.33 < OR = 4.00 ng/mL  CBC with Differential/Platelet   Collection Time: 07/20/23  7:53 AM  Result Value Ref Range   WBC 6.5 3.8 - 10.8 Thousand/uL   RBC 4.87 4.20 - 5.80 Million/uL   Hemoglobin 15.0 13.2 - 17.1 g/dL   HCT 55.3 61.4 - 49.9 %   MCV 91.6 80.0 - 100.0 fL   MCH 30.8 27.0 - 33.0 pg   MCHC 33.6 32.0 - 36.0 g/dL   RDW 87.1 88.9 - 84.9 %   Platelets 184 140 - 400 Thousand/uL   MPV 10.6 7.5 - 12.5 fL   Neutro Abs 3,660 1,500 - 7,800 cells/uL   Absolute Lymphocytes 2,009 850 - 3,900 cells/uL   Absolute Monocytes 631 200 - 950 cells/uL   Eosinophils Absolute 150 15 - 500 cells/uL   Basophils Absolute 52 0 - 200 cells/uL   Neutrophils Relative % 56.3 %   Total Lymphocyte 30.9 %   Monocytes Relative 9.7 %   Eosinophils Relative 2.3 %   Basophils Relative 0.8 %  COMPLETE METABOLIC  PANEL WITH GFR   Collection Time: 07/20/23  7:53 AM  Result Value Ref Range   Glucose, Bld 117 (H) 65 - 99 mg/dL   BUN 15 7 - 25 mg/dL   Creat 8.99 9.29 - 8.69 mg/dL   BUN/Creatinine Ratio SEE NOTE: 6 - 22 (calc)   Sodium 141 135 - 146 mmol/L   Potassium 3.9 3.5 - 5.3 mmol/L   Chloride 107 98 - 110 mmol/L   CO2 26 20 - 32 mmol/L   Calcium  9.3 8.6 - 10.3 mg/dL   Total Protein 7.5 6.1 - 8.1 g/dL   Albumin 4.7 3.6 - 5.1 g/dL   Globulin 2.8 1.9 - 3.7 g/dL (calc)   AG Ratio 1.7 1.0 - 2.5 (calc)   Total Bilirubin 0.6 0.2 - 1.2 mg/dL   Alkaline phosphatase (APISO) 54 35 - 144 U/L  AST 63 (H) 10 - 35 U/L   ALT 125 (H) 9 - 46 U/L  Hemoglobin A1c   Collection Time: 07/20/23  7:53 AM  Result Value Ref Range   Hgb A1c MFr Bld 6.1 (H) <5.7 %   Mean Plasma Glucose 128 mg/dL   eAG (mmol/L) 7.1 mmol/L  Lipid panel   Collection Time: 07/20/23  7:53 AM  Result Value Ref Range   Cholesterol 216 (H) <200 mg/dL   HDL 47 > OR = 40 mg/dL   Triglycerides 770 (H) <150 mg/dL   LDL Cholesterol (Calc) 132 (H) mg/dL (calc)   Total CHOL/HDL Ratio 4.6 <5.0 (calc)   Non-HDL Cholesterol (Calc) 169 (H) <130 mg/dL (calc)      Assessment & Plan:   Problem List Items Addressed This Visit   None Visit Diagnoses       Annual physical exam    -  Primary        Updated Health Maintenance information Reviewed recent lab results with patient Encouraged improvement to lifestyle with diet and exercise Goal of weight loss   Type 2 Diabetes Mellitus Diabetes well-controlled with A1c of 6.1. Weight increased to 247 lbs. Mounjaro  7.5 mg used, overeating in evenings noted. Gastrointestinal discomfort with previous medications. Increasing Mounjaro  to 10 mg may help. - Increase Mounjaro  dose from 7.5 mg to 10 mg. - Order Mounjaro  for one month supply with zero refills. Notify for refills at preferred dose. 7.5 or 10 - Monitor weight and dietary habits. - Consider virtual dietitian service for meal planning  and dietary advice.  Hypertension Blood pressure improved but remains elevated at 148/88 mmHg. CPAP therapy and weight loss expected to help. Previous BP medication caused dizziness. - Monitor blood pressure at home. - Continue CPAP therapy. - Focus on weight loss as a non-pharmacological approach to manage blood pressure.  Hyperlipidemia LDL at 130s without statin. Cardiovascular risk low but increased by diabetes. CT Coronary Calcium  score reviewed. Previous statin caused muscle aches. - Consider daily baby aspirin (81 mg) as a preventive measure. - Monitor cholesterol levels.  Fatty Liver Disease Mildly elevated liver enzymes consistent with fatty liver. Weight loss key for management. - Focus on weight loss to improve liver health.  Obstructive Sleep Apnea on CPAP Controlled OSA on CPAP Recently completed [facility] HST, and has obtained CPAP through [facility] Managed with CPAP therapy. Improved morning energy but end-of-day fatigue persists.  Follow-up now 30-90 days for compliance - Continue CPAP therapy. - He is compliant and using CPAP with nightly adherence. - He tolerates CPAP therapy well and he benefits from CPAP therapy  - Communicate with [facility] regarding CPAP usage and supplies.  General Health Maintenance Annual wellness visit conducted. Prevnar 20 deferred. Colonoscopy and dermatology follow-ups planned for next year. - Defer Prevnar 20 vaccination to next year. - Schedule colonoscopy for October next year. - Plan dermatology follow-up next year for skin surveillance.  Follow-up Next visit scheduled for November 24th at 3 PM. Blood sugar check planned. - Schedule follow-up visit on November 24th at 3 PM. - Perform blood sugar check during follow-up visit.      Fax note to [facility] for CPAP compliance [place], [place] Fax [phone] ***   No orders of the defined types were placed in this encounter.   Meds ordered this encounter   Medications   aspirin 81 MG chewable tablet    Sig: Chew 1 tablet (81 mg total) by mouth daily.     Follow up  plan: Return in about 5 months (around 12/18/2023) for 12/18/2023 for follow-up DM A1c.  Marsa Officer, DO [facility] 07/26/2023, 3:52 PM

## 2023-07-31 ENCOUNTER — Encounter: Admitting: Family Medicine

## 2023-08-02 DIAGNOSIS — G4733 Obstructive sleep apnea (adult) (pediatric): Secondary | ICD-10-CM | POA: Diagnosis not present

## 2023-08-22 ENCOUNTER — Other Ambulatory Visit: Payer: Self-pay | Admitting: Family Medicine

## 2023-08-22 DIAGNOSIS — E1169 Type 2 diabetes mellitus with other specified complication: Secondary | ICD-10-CM

## 2023-08-23 MED ORDER — MOUNJARO 10 MG/0.5ML ~~LOC~~ SOAJ: 10.0000 mg | SUBCUTANEOUS | 0 refills | Status: DC

## 2023-08-23 NOTE — Addendum Note (Signed)
 Addended by: ZELIA GAUZE D on: 08/23/2023 02:15 PM   Modules accepted: Orders

## 2023-08-23 NOTE — Telephone Encounter (Signed)
 Requested medication (s) are due for refill today: yes  Requested medication (s) are on the active medication list: yes  Last refill:  07/25/23  Future visit scheduled: yes  Notes to clinic:  Medication not assigned to a protocol, review manually. Refill for the 10 MG dose.     Requested Prescriptions  Pending Prescriptions Disp Refills   MOUNJARO  10 MG/0.5ML Pen [Pharmacy Med Name: MOUNJARO  10 MG/0.5 ML PEN]      Sig: INJECT 10 MG INTO THE SKIN ONE TIME PER WEEK     Off-Protocol Failed - 08/23/2023  1:55 PM      Failed - Medication not assigned to a protocol, review manually.      Passed - Valid encounter within last 12 months    Recent Outpatient Visits           4 weeks ago Annual physical exam   [place] [facility] [place], Marsa PARAS, DO   4 months ago Type 2 diabetes mellitus with other specified complication, without long-term current use of insulin [facility])   [place] [facility], Marsa PARAS, DO               MOUNJARO  7.5 MG/0.5ML Pen [Pharmacy Med Name: MOUNJARO  7.5 MG/0.5 ML PEN]  2    Sig: INJECT 7.5 MG SUBCUTANEOUSLY WEEKLY     Off-Protocol Failed - 08/23/2023  1:55 PM      Failed - Medication not assigned to a protocol, review manually.      Passed - Valid encounter within last 12 months    Recent Outpatient Visits           4 weeks ago Annual physical exam   [place] [facility] [place], Marsa PARAS, DO   4 months ago Type 2 diabetes mellitus with other specified complication, without long-term current use of insulin [facility])   [place] [facility] [place], Marsa PARAS, [place]

## 2023-09-02 DIAGNOSIS — G4733 Obstructive sleep apnea (adult) (pediatric): Secondary | ICD-10-CM | POA: Diagnosis not present

## 2023-09-08 DIAGNOSIS — G4733 Obstructive sleep apnea (adult) (pediatric): Secondary | ICD-10-CM | POA: Diagnosis not present

## 2023-09-18 ENCOUNTER — Other Ambulatory Visit: Payer: Self-pay | Admitting: Family Medicine

## 2023-09-18 DIAGNOSIS — E1169 Type 2 diabetes mellitus with other specified complication: Secondary | ICD-10-CM

## 2023-09-20 NOTE — Telephone Encounter (Signed)
 Requested medication (s) are due for refill today: yes  Requested medication (s) are on the active medication list: yes  Last refill:  08/23/23  Future visit scheduled: {Yes  Notes to clinic:  Medication not assigned to a protocol, review manually.      Requested Prescriptions  Pending Prescriptions Disp Refills   MOUNJARO  10 MG/0.5ML Pen [Pharmacy Med Name: MOUNJARO  10 MG/0.5 ML PEN]      Sig: INJECT 10 MG INTO THE SKIN ONE TIME PER WEEK     Off-Protocol Failed - 09/20/2023  9:08 AM      Failed - Medication not assigned to a protocol, review manually.      Passed - Valid encounter within last 12 months    Recent Outpatient Visits           1 month ago Annual physical exam   [place] [facility] [place], Marsa PARAS, DO   5 months ago Type 2 diabetes mellitus with other specified complication, without long-term current use of insulin [facility])   [place] [facility] [place], Marsa PARAS, [place]

## 2023-10-03 DIAGNOSIS — G4733 Obstructive sleep apnea (adult) (pediatric): Secondary | ICD-10-CM | POA: Diagnosis not present

## 2023-12-18 ENCOUNTER — Encounter: Payer: Self-pay | Admitting: Family Medicine

## 2023-12-18 ENCOUNTER — Ambulatory Visit: Admitting: Family Medicine

## 2023-12-18 VITALS — BP 158/90 | HR 90 | Ht 76.0 in | Wt 245.4 lb

## 2023-12-18 DIAGNOSIS — N200 Calculus of kidney: Secondary | ICD-10-CM

## 2023-12-18 DIAGNOSIS — E1169 Type 2 diabetes mellitus with other specified complication: Secondary | ICD-10-CM | POA: Diagnosis not present

## 2023-12-18 DIAGNOSIS — Z7985 Long-term (current) use of injectable non-insulin antidiabetic drugs: Secondary | ICD-10-CM | POA: Diagnosis not present

## 2023-12-18 DIAGNOSIS — I10 Essential (primary) hypertension: Secondary | ICD-10-CM | POA: Diagnosis not present

## 2023-12-18 DIAGNOSIS — G720 Drug-induced myopathy: Secondary | ICD-10-CM

## 2023-12-18 LAB — POCT GLYCOSYLATED HEMOGLOBIN (HGB A1C): Hemoglobin A1C: 5.9 % — AB (ref 4.0–5.6)

## 2023-12-18 MED ORDER — CYCLOBENZAPRINE HCL 10 MG PO TABS: 5.0000 mg | ORAL_TABLET | Freq: Three times a day (TID) | ORAL | 0 refills | Status: AC | PRN

## 2023-12-18 MED ORDER — MOUNJARO 5 MG/0.5ML ~~LOC~~ SOAJ: 5.0000 mg | SUBCUTANEOUS | 1 refills | Status: AC

## 2023-12-18 MED ORDER — TAMSULOSIN HCL 0.4 MG PO CAPS: 0.4000 mg | ORAL_CAPSULE | Freq: Every day | ORAL | 0 refills | Status: AC

## 2023-12-18 NOTE — Patient Instructions (Addendum)
 Thank you for coming to the office today.  BP elevation we can consider Amlodipine in future if still elevated.  You most likely have a Kidney Stone (Nephrolithiasis)  Use muscle relaxant Flexeril  as needed caution sedation  Also start Flomax  0.4mg  daily for help improve urine flow  Sample Mounjaro  2.5 and new rx 5mg .  Please schedule a Follow-up Appointment to: Return in about 7 months (around 07/17/2024) for 7 month fasting lab > 1 week later Annual Physical.  If you have any other questions or concerns, please feel free to call the office or send a message through MyChart. You may also schedule an earlier appointment if necessary.  Additionally, you may be receiving a survey about your experience at our office within a few days to 1 week by e-mail or mail. We value your feedback.  Marsa Officer, DO [facility], [place]

## 2023-12-18 NOTE — Progress Notes (Unsigned)
 Subjective:    Patient ID: Isaiah VEAR Honore Mickey., male    DOB: [birth year], 53 y.o.   MRN: [id number]  Isaiah Doyle. is a 53 y.o. male presenting on 12/18/2023 for Medical Management of Chronic Issues   HPI  CHRONIC DM, Type 2: A1c improved to 5.8 on Mounjaro  Admits difficulty w weight gain still Reduced GI side effects Meds: Mounjaro  5mg  weekly Off Metformin  Reports  good compliance. No more GI side effects Currently not on ACEi/ARB Lifestyle: - Diet (goal to improve - Exercise (limited exercise due to time) Upcoming [facility] next apt in Spring/Summer 2025 Denies hypoglycemia, polyuria, visual changes, numbness or tingling   HYPERLIPIDEMIA Drug Induced Myopathy OFF Atorvastatin  due to myalgia Not on statin therapy   Sister dx Hemochromotosis Last lab shows high normal Ferritin. Normal CBC   Essential Hypertension Fam history of HYPERTENSION Weight gain now and sleep worsening. Concern for elevated BP readings now. Today. Does not have BP cuff at home. Needs to get new one Current Meds - OFF Meds Admits headache Denies CP, dyspnea, edema, dizziness / lightheadedness  ***Failed Olmesartan  5mg  he had low BP ***Future consideration if still elevated BP can consider low dose Amlodipine 2.5 or 5mg       BPH Frequency LUTS History of Kidney Stones Doing well without recent kidney stones. No actual current symptoms. He usually takes Flomax  PRN but now taking Flomax  regularly now needs rx re order due to BPH and urinary frequency   OSA / Snoring History in 2008 he had sleep study previously and was on CPAP. Came off CPAP due to weight loss no longer needed. Now worsening weight gain and snoring and awakening.   He consumes alcohol regularly, typically two to three shots in the evening, which he notes helps him relax but may contribute to his sleep disturbances.        Health Maintenance: ***     12/18/2023    3:21 PM 07/26/2023    3:23 PM 04/18/2023     4:25 PM  Depression screen PHQ 2/9  Decreased Interest 0 0 0  Down, Depressed, Hopeless 0 0 0  PHQ - 2 Score 0 0 0  Altered sleeping  0 1  Tired, decreased energy  1 1  Change in appetite  0 0  Feeling bad or failure about yourself   0 0  Trouble concentrating  0 0  Moving slowly or fidgety/restless  0 0  Suicidal thoughts  0 0  PHQ-9 Score  1  2   Difficult doing work/chores  Not difficult at all Somewhat difficult     Data saved with a previous flowsheet row definition       12/18/2023    3:21 PM 07/26/2023    3:23 PM 04/18/2023    4:25 PM 10/19/2022    8:05 AM  GAD 7 : Generalized Anxiety Score  Nervous, Anxious, on Edge 0 0 0 0  Control/stop worrying 0 0 0 0  Worry too much - different things 0 0 0 0  Trouble relaxing 0 0 1 0  Restless 0 0 1 0  Easily annoyed or irritable 0 0 1 0  Afraid - awful might happen 0 0 0 0  Total GAD 7 Score 0 0 3 0  Anxiety Difficulty   Somewhat difficult     Social History   Tobacco Use   Smoking status: Former    Current packs/day: 0.00    Types: Cigarettes    Quit  date: 01/25/2008    Years since quitting: 15.9   Smokeless tobacco: Former   Tobacco comments:    Initially quit 2001, then resumed temporarily 2010  Vaping Use   Vaping status: Never Used  Substance Use Topics   Alcohol use: Yes    Alcohol/week: 12.0 standard drinks of alcohol    Types: 12 Cans of beer per week    Comment: a few shots of liquor in evening, none in the last week   Drug use: No    Review of Systems Per HPI unless specifically indicated above     Objective:    BP (!) 160/92 (BP Location: Left Arm, Patient Position: Sitting, Cuff Size: Large)   Pulse 90   Ht 6' 4 (1.93 m)   Wt 245 lb 6 oz (111.3 kg)   SpO2 95%   BMI 29.87 kg/m   Wt Readings from Last 3 Encounters:  12/18/23 245 lb 6 oz (111.3 kg)  07/26/23 247 lb 6 oz (112.2 kg)  04/18/23 245 lb (111.1 kg)    Physical Exam  Results for orders placed or performed in visit on 12/18/23   POCT HgB A1C   Collection Time: 12/18/23  3:31 PM  Result Value Ref Range   Hemoglobin A1C 5.9 (A) 4.0 - 5.6 %   HbA1c POC (<> result, manual entry)     HbA1c, POC (prediabetic range)     HbA1c, POC (controlled diabetic range)        Assessment & Plan:   Problem List Items Addressed This Visit     Type 2 diabetes mellitus with other specified complication (HCC) - Primary   Relevant Orders   POCT HgB A1C (Completed)     ***  Orders Placed This Encounter  Procedures   POCT HgB A1C    No orders of the defined types were placed in this encounter.   Follow up plan: No follow-ups on file.  ***Future labs ordered for ***   Marsa Officer, DO [facility] 12/18/2023, 3:58 PM

## 2023-12-19 ENCOUNTER — Other Ambulatory Visit: Payer: Self-pay | Admitting: Family Medicine

## 2023-12-19 DIAGNOSIS — G720 Drug-induced myopathy: Secondary | ICD-10-CM

## 2023-12-19 DIAGNOSIS — N401 Enlarged prostate with lower urinary tract symptoms: Secondary | ICD-10-CM

## 2023-12-19 DIAGNOSIS — I10 Essential (primary) hypertension: Secondary | ICD-10-CM

## 2023-12-19 DIAGNOSIS — K219 Gastro-esophageal reflux disease without esophagitis: Secondary | ICD-10-CM

## 2023-12-19 DIAGNOSIS — Z0000 Encounter for general adult medical examination without abnormal findings: Secondary | ICD-10-CM

## 2023-12-19 DIAGNOSIS — E1169 Type 2 diabetes mellitus with other specified complication: Secondary | ICD-10-CM

## 2023-12-19 DIAGNOSIS — K760 Fatty (change of) liver, not elsewhere classified: Secondary | ICD-10-CM

## 2023-12-19 DIAGNOSIS — Z7985 Long-term (current) use of injectable non-insulin antidiabetic drugs: Secondary | ICD-10-CM

## 2023-12-29 ENCOUNTER — Other Ambulatory Visit: Payer: Self-pay | Admitting: Family Medicine

## 2023-12-29 DIAGNOSIS — N200 Calculus of kidney: Secondary | ICD-10-CM

## 2024-01-01 NOTE — Telephone Encounter (Signed)
 Requested medications are due for refill today.  unsure  Requested medications are on the active medications list.  yes  Last refill. 12/18/2023 #30 0 rf  Future visit scheduled.   yes  Notes to clinic.  Pt was to take this medication for 2 weeks.    Requested Prescriptions  Pending Prescriptions Disp Refills   tamsulosin  (FLOMAX ) 0.4 MG CAPS capsule [Pharmacy Med Name: TAMSULOSIN  HCL 0.4 MG CAPSULE] 90 capsule 1    Sig: TAKE 1 CAPSULE (0.4 MG) BY MOUTH DAILY. FOR UP TO 2 WEEKS FOR KIDNEY STONE, MAY REPEAT IF NEEDED.     Urology: Alpha-Adrenergic Blocker Failed - 01/01/2024  4:12 PM      Failed - Last BP in normal range    BP Readings from Last 1 Encounters:  12/18/23 (!) 158/90         Passed - PSA in normal range and within 360 days    PSA  Date Value Ref Range Status  07/20/2023 0.33 < OR = 4.00 ng/mL Final    Comment:    The total PSA value from this assay system is  standardized against the WHO standard. The test  result will be approximately 20% lower when compared  to the equimolar-standardized total PSA (Beckman  Coulter). Comparison of serial PSA results should be  interpreted with this fact in mind. . This test was performed using the Siemens  chemiluminescent method. Values obtained from  different assay methods cannot be used interchangeably. PSA levels, regardless of value, should not be interpreted as absolute evidence of the presence or absence of disease.          Passed - Valid encounter within last 12 months    Recent Outpatient Visits           2 weeks ago Type 2 diabetes mellitus with other specified complication, without long-term current use of insulin [facility])   [place] [facility] [place], Marsa PARAS, DO   5 months ago Annual physical exam   [place] [facility] [place] Marsa PARAS, DO   8 months ago Type 2 diabetes mellitus with other specified complication, without long-term current use of  insulin [facility])   [place] [facility] [place], Marsa PARAS, [place]

## 2024-07-23 ENCOUNTER — Other Ambulatory Visit

## 2024-07-29 ENCOUNTER — Encounter: Admitting: Family Medicine
